# Patient Record
Sex: Female | Born: 1985 | Race: White | Hispanic: No | Marital: Single | Smoking: Never smoker
Health system: Southern US, Community
[De-identification: ages and names within clinical notes are randomized; demographics above are authoritative.]

## PROBLEM LIST (undated history)

## (undated) ENCOUNTER — Inpatient Hospital Stay (HOSPITAL_COMMUNITY): Payer: Self-pay

## (undated) DIAGNOSIS — E669 Obesity, unspecified: Secondary | ICD-10-CM

## (undated) DIAGNOSIS — E282 Polycystic ovarian syndrome: Secondary | ICD-10-CM

## (undated) HISTORY — DX: Obesity, unspecified: E66.9

## (undated) HISTORY — PX: LUNG SURGERY: SHX703

## (undated) HISTORY — PX: MOUTH SURGERY: SHX715

---
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
## 2004-01-13 ENCOUNTER — Ambulatory Visit (HOSPITAL_COMMUNITY): Admission: RE | Admit: 2004-01-13 | Discharge: 2004-01-13 | Payer: Self-pay | Admitting: Obstetrics and Gynecology

## 2004-05-14 ENCOUNTER — Inpatient Hospital Stay (HOSPITAL_COMMUNITY): Admission: AD | Admit: 2004-05-14 | Discharge: 2004-05-14 | Payer: Self-pay | Admitting: Obstetrics and Gynecology

## 2004-05-16 ENCOUNTER — Inpatient Hospital Stay (HOSPITAL_COMMUNITY): Admission: AD | Admit: 2004-05-16 | Discharge: 2004-05-18 | Payer: Self-pay | Admitting: Obstetrics and Gynecology

## 2004-07-15 ENCOUNTER — Other Ambulatory Visit: Admission: RE | Admit: 2004-07-15 | Discharge: 2004-07-15 | Payer: Self-pay | Admitting: Obstetrics and Gynecology

## 2005-09-22 ENCOUNTER — Ambulatory Visit: Payer: Self-pay | Admitting: Family Medicine

## 2005-09-23 ENCOUNTER — Ambulatory Visit: Payer: Self-pay | Admitting: *Deleted

## 2006-08-10 ENCOUNTER — Emergency Department (HOSPITAL_COMMUNITY): Admission: EM | Admit: 2006-08-10 | Discharge: 2006-08-10 | Payer: Self-pay | Admitting: Emergency Medicine

## 2006-08-12 ENCOUNTER — Emergency Department (HOSPITAL_COMMUNITY): Admission: EM | Admit: 2006-08-12 | Discharge: 2006-08-12 | Payer: Self-pay | Admitting: Family Medicine

## 2006-12-14 DIAGNOSIS — A5901 Trichomonal vulvovaginitis: Secondary | ICD-10-CM | POA: Insufficient documentation

## 2007-01-04 ENCOUNTER — Ambulatory Visit: Payer: Self-pay | Admitting: Internal Medicine

## 2007-02-07 ENCOUNTER — Ambulatory Visit: Payer: Self-pay | Admitting: Internal Medicine

## 2007-02-07 LAB — CONVERTED CEMR LAB
Basophils Relative: 1 % (ref 0–1)
Hemoglobin: 14 g/dL (ref 12.0–15.0)
Lymphocytes Relative: 32 % (ref 12–46)
Lymphs Abs: 2.8 10*3/uL (ref 0.7–4.0)
MCHC: 31.7 g/dL (ref 30.0–36.0)
Monocytes Absolute: 0.6 10*3/uL (ref 0.1–1.0)
Monocytes Relative: 7 % (ref 3–12)
Neutro Abs: 4.3 10*3/uL (ref 1.7–7.7)
RBC: 5.02 M/uL (ref 3.87–5.11)

## 2007-02-09 ENCOUNTER — Ambulatory Visit (HOSPITAL_COMMUNITY): Admission: RE | Admit: 2007-02-09 | Discharge: 2007-02-09 | Payer: Self-pay | Admitting: Internal Medicine

## 2007-03-01 ENCOUNTER — Ambulatory Visit: Payer: Self-pay | Admitting: Internal Medicine

## 2007-04-05 ENCOUNTER — Ambulatory Visit: Payer: Self-pay | Admitting: Internal Medicine

## 2007-04-05 ENCOUNTER — Encounter: Payer: Self-pay | Admitting: Internal Medicine

## 2007-04-21 ENCOUNTER — Emergency Department (HOSPITAL_COMMUNITY): Admission: EM | Admit: 2007-04-21 | Discharge: 2007-04-21 | Payer: Self-pay | Admitting: Family Medicine

## 2007-04-25 ENCOUNTER — Ambulatory Visit: Payer: Self-pay | Admitting: Internal Medicine

## 2007-06-15 ENCOUNTER — Ambulatory Visit: Payer: Self-pay | Admitting: Internal Medicine

## 2007-06-15 ENCOUNTER — Encounter: Payer: Self-pay | Admitting: Family Medicine

## 2007-06-15 LAB — CONVERTED CEMR LAB
RBC / HPF: NONE SEEN (ref <3)
WBC, UA: NONE SEEN cells/hpf (ref <3)

## 2007-07-11 ENCOUNTER — Ambulatory Visit: Payer: Self-pay | Admitting: Internal Medicine

## 2007-09-19 ENCOUNTER — Inpatient Hospital Stay (HOSPITAL_COMMUNITY): Admission: AD | Admit: 2007-09-19 | Discharge: 2007-09-19 | Payer: Self-pay | Admitting: Obstetrics

## 2007-09-19 ENCOUNTER — Encounter: Payer: Self-pay | Admitting: Emergency Medicine

## 2007-09-19 ENCOUNTER — Encounter (INDEPENDENT_AMBULATORY_CARE_PROVIDER_SITE_OTHER): Payer: Self-pay | Admitting: Emergency Medicine

## 2007-09-19 ENCOUNTER — Encounter (INDEPENDENT_AMBULATORY_CARE_PROVIDER_SITE_OTHER): Payer: Self-pay | Admitting: Obstetrics

## 2007-12-19 ENCOUNTER — Emergency Department (HOSPITAL_COMMUNITY): Admission: EM | Admit: 2007-12-19 | Discharge: 2007-12-19 | Payer: Self-pay | Admitting: Emergency Medicine

## 2008-03-20 ENCOUNTER — Ambulatory Visit: Payer: Self-pay | Admitting: Internal Medicine

## 2008-07-17 ENCOUNTER — Encounter: Payer: Self-pay | Admitting: Internal Medicine

## 2008-07-17 ENCOUNTER — Ambulatory Visit: Payer: Self-pay | Admitting: Internal Medicine

## 2008-07-17 DIAGNOSIS — H6690 Otitis media, unspecified, unspecified ear: Secondary | ICD-10-CM | POA: Insufficient documentation

## 2008-08-29 ENCOUNTER — Ambulatory Visit: Payer: Self-pay | Admitting: Internal Medicine

## 2008-09-04 ENCOUNTER — Telehealth (INDEPENDENT_AMBULATORY_CARE_PROVIDER_SITE_OTHER): Payer: Self-pay | Admitting: *Deleted

## 2008-09-04 ENCOUNTER — Emergency Department (HOSPITAL_COMMUNITY): Admission: EM | Admit: 2008-09-04 | Discharge: 2008-09-04 | Payer: Self-pay | Admitting: Emergency Medicine

## 2008-09-17 ENCOUNTER — Ambulatory Visit: Payer: Self-pay | Admitting: Internal Medicine

## 2008-10-14 ENCOUNTER — Telehealth (INDEPENDENT_AMBULATORY_CARE_PROVIDER_SITE_OTHER): Payer: Self-pay | Admitting: *Deleted

## 2008-10-15 ENCOUNTER — Encounter: Admission: RE | Admit: 2008-10-15 | Discharge: 2008-10-15 | Payer: Self-pay | Admitting: Internal Medicine

## 2008-10-15 ENCOUNTER — Ambulatory Visit: Payer: Self-pay | Admitting: Internal Medicine

## 2008-11-12 ENCOUNTER — Ambulatory Visit: Payer: Self-pay | Admitting: Internal Medicine

## 2008-12-18 ENCOUNTER — Telehealth (INDEPENDENT_AMBULATORY_CARE_PROVIDER_SITE_OTHER): Payer: Self-pay | Admitting: *Deleted

## 2008-12-19 ENCOUNTER — Ambulatory Visit: Payer: Self-pay | Admitting: Family Medicine

## 2009-07-02 ENCOUNTER — Ambulatory Visit: Payer: Self-pay | Admitting: Internal Medicine

## 2009-07-02 LAB — CONVERTED CEMR LAB
Preg, Serum: NEGATIVE
TSH: 2.102 microintl units/mL (ref 0.350–4.500)

## 2009-10-12 ENCOUNTER — Emergency Department (HOSPITAL_COMMUNITY): Admission: EM | Admit: 2009-10-12 | Discharge: 2009-10-13 | Payer: Self-pay | Admitting: Emergency Medicine

## 2009-10-28 ENCOUNTER — Ambulatory Visit (HOSPITAL_COMMUNITY): Admission: RE | Admit: 2009-10-28 | Discharge: 2009-10-28 | Payer: Self-pay | Admitting: Family Medicine

## 2010-03-01 ENCOUNTER — Emergency Department (HOSPITAL_COMMUNITY)
Admission: EM | Admit: 2010-03-01 | Discharge: 2010-03-01 | Payer: Self-pay | Source: Home / Self Care | Admitting: Family Medicine

## 2010-04-13 ENCOUNTER — Encounter (INDEPENDENT_AMBULATORY_CARE_PROVIDER_SITE_OTHER): Payer: Self-pay | Admitting: Family Medicine

## 2010-04-13 LAB — CONVERTED CEMR LAB
CO2: 27 meq/L (ref 19–32)
Creatinine, Ser: 0.63 mg/dL (ref 0.40–1.20)
Eosinophils Relative: 7 % — ABNORMAL HIGH (ref 0–5)
Glucose, Bld: 91 mg/dL (ref 70–99)
HCT: 43 % (ref 36.0–46.0)
Hemoglobin: 13.4 g/dL (ref 12.0–15.0)
Hgb A1c MFr Bld: 6.2 % — ABNORMAL HIGH (ref <5.7)
Lymphocytes Relative: 31 % (ref 12–46)
Lymphs Abs: 3 10*3/uL (ref 0.7–4.0)
Monocytes Absolute: 0.9 10*3/uL (ref 0.1–1.0)
RBC: 4.84 M/uL (ref 3.87–5.11)
Total Bilirubin: 0.3 mg/dL (ref 0.3–1.2)
Total Protein: 7.2 g/dL (ref 6.0–8.3)

## 2010-04-15 LAB — URINALYSIS, ROUTINE W REFLEX MICROSCOPIC
Bilirubin Urine: NEGATIVE
Glucose, UA: NEGATIVE mg/dL
Ketones, ur: NEGATIVE mg/dL
Nitrite: NEGATIVE
Protein, ur: NEGATIVE mg/dL
Specific Gravity, Urine: 1.028 (ref 1.005–1.030)
Urobilinogen, UA: 1 mg/dL (ref 0.0–1.0)
pH: 6.5 (ref 5.0–8.0)

## 2010-04-15 LAB — BASIC METABOLIC PANEL
Chloride: 108 mEq/L (ref 96–112)
GFR calc Af Amer: >60 mL/min (ref >60)
GFR calc non Af Amer: >60 mL/min (ref >60)
Potassium: 3.4 mEq/L — ABNORMAL LOW (ref 3.5–5.1)

## 2010-04-15 LAB — DIFFERENTIAL
Basophils Absolute: 0.1 10*3/uL (ref 0.0–0.1)
Basophils Relative: 1 % (ref 0–1)
Eosinophils Absolute: 0.6 K/uL (ref 0.0–0.7)
Eosinophils Relative: 5 % (ref 0–5)
Lymphocytes Relative: 22 % (ref 12–46)
Lymphs Abs: 2.7 10*3/uL (ref 0.7–4.0)
Monocytes Absolute: 1.3 10*3/uL — ABNORMAL HIGH (ref 0.1–1.0)
Monocytes Relative: 11 % (ref 3–12)
Neutro Abs: 7.3 10*3/uL (ref 1.7–7.7)
Neutrophils Relative %: 60 % (ref 43–77)

## 2010-04-15 LAB — BASIC METABOLIC PANEL WITH GFR
BUN: 10 mg/dL (ref 6–23)
CO2: 26 meq/L (ref 19–32)
Calcium: 8.7 mg/dL (ref 8.4–10.5)
Creatinine, Ser: 0.77 mg/dL (ref 0.4–1.2)
Glucose, Bld: 144 mg/dL — ABNORMAL HIGH (ref 70–99)
Sodium: 138 meq/L (ref 135–145)

## 2010-04-15 LAB — POCT I-STAT, CHEM 8
BUN: 10 mg/dL (ref 6–23)
Calcium, Ion: 1.17 mmol/L (ref 1.12–1.32)
Chloride: 105 mEq/L (ref 96–112)
Creatinine, Ser: 0.7 mg/dL (ref 0.4–1.2)
Glucose, Bld: 141 mg/dL — ABNORMAL HIGH (ref 70–99)
HCT: 40 % (ref 36.0–46.0)
Hemoglobin: 13.6 g/dL (ref 12.0–15.0)
Potassium: 3.5 meq/L (ref 3.5–5.1)
Sodium: 143 mEq/L (ref 135–145)
TCO2: 26 mmol/L (ref 0–100)

## 2010-04-15 LAB — WET PREP, GENITAL
Clue Cells Wet Prep HPF POC: NONE SEEN
Trich, Wet Prep: NONE SEEN
Yeast Wet Prep HPF POC: NONE SEEN

## 2010-04-15 LAB — CBC
HCT: 39.2 % (ref 36.0–46.0)
Hemoglobin: 12.8 g/dL (ref 12.0–15.0)
MCH: 28.3 pg (ref 26.0–34.0)
MCHC: 32.7 g/dL (ref 30.0–36.0)
MCV: 86.7 fL (ref 78.0–100.0)
Platelets: 315 10*3/uL (ref 150–400)
RBC: 4.52 MIL/uL (ref 3.87–5.11)
RDW: 12.9 % (ref 11.5–15.5)
WBC: 12.1 10*3/uL — ABNORMAL HIGH (ref 4.0–10.5)

## 2010-04-15 LAB — GC/CHLAMYDIA PROBE AMP, GENITAL
Chlamydia, DNA Probe: NEGATIVE
GC Probe Amp, Genital: NEGATIVE

## 2010-04-15 LAB — URINE MICROSCOPIC-ADD ON

## 2010-04-15 LAB — POCT PREGNANCY, URINE: Preg Test, Ur: NEGATIVE

## 2010-04-15 LAB — RPR: RPR Ser Ql: NONREACTIVE

## 2010-06-18 NOTE — Op Note (Signed)
NAMEALEJANDRO, ADCOX        ACCOUNT NO.:  [id number]   MEDICAL RECORD NO.:  [id number]          PATIENT TYPE:  INP   LOCATION:  9128                          FACILITY:  [facility]   PHYSICIAN:  Charles A. Delcambre, MDDATE OF BIRTH:  [birth year]   DATE OF PROCEDURE:  05/16/2004  DATE OF DISCHARGE:                                 OPERATIVE REPORT   PROCEDURE:  Delivery note.   DESCRIPTION OF PROCEDURE:  This patient became complete at 1236.  She  delivered by spontaneous vaginal delivery at 1249.  Vigorous female, Apgars  9 and 9, after pushing well.  Fetal heart rate was reactive and the baby was  vigorous as noted above.  A second degree midline laceration was noted, 3-0  Vicryl with local anesthesia was used to repair.  The placenta was  spontaneous, three-vessel, and intact.  Estimated blood loss was 300 mL.  Father of the baby cut the cord.  The placenta was delivered at 1305.  Mother and baby are recovering and stable at this time.  Penicillin had been  given per protocol.  Epidural had been given when the patient was 7 to 8 cm.  Right after epidural, she was noted to be anterior lip, complete, and +2.  She became completely dilated several minutes thereafter and delivery ensued  as noted.  For remainder of note, see the patient's chart.      CAD/MEDQ  D:  05/16/2004  T:  05/16/2004  Job:  [id number]

## 2010-09-25 ENCOUNTER — Ambulatory Visit (INDEPENDENT_AMBULATORY_CARE_PROVIDER_SITE_OTHER): Payer: Medicaid Other

## 2010-09-25 ENCOUNTER — Inpatient Hospital Stay (INDEPENDENT_AMBULATORY_CARE_PROVIDER_SITE_OTHER)
Admission: RE | Admit: 2010-09-25 | Discharge: 2010-09-25 | Disposition: A | Discharge status: Home / Self Care | Payer: Medicaid Other | Source: Ambulatory Visit | Attending: Emergency Medicine | Admitting: Emergency Medicine

## 2010-09-25 DIAGNOSIS — K219 Gastro-esophageal reflux disease without esophagitis: Secondary | ICD-10-CM

## 2010-11-11 ENCOUNTER — Emergency Department (HOSPITAL_COMMUNITY)
Admission: EM | Admit: 2010-11-11 | Discharge: 2010-11-12 | Disposition: A | Discharge status: Home / Self Care | Payer: Medicaid Other | Attending: Emergency Medicine | Admitting: Emergency Medicine

## 2010-11-11 DIAGNOSIS — R1012 Left upper quadrant pain: Secondary | ICD-10-CM | POA: Insufficient documentation

## 2010-11-11 DIAGNOSIS — K2970 Gastritis, unspecified, without bleeding: Secondary | ICD-10-CM | POA: Insufficient documentation

## 2010-11-11 DIAGNOSIS — J45909 Unspecified asthma, uncomplicated: Secondary | ICD-10-CM | POA: Insufficient documentation

## 2010-11-11 DIAGNOSIS — E282 Polycystic ovarian syndrome: Secondary | ICD-10-CM | POA: Insufficient documentation

## 2010-11-11 LAB — COMPREHENSIVE METABOLIC PANEL
ALT: 192 U/L — ABNORMAL HIGH (ref 0–35)
AST: 97 U/L — ABNORMAL HIGH (ref 0–37)
Alkaline Phosphatase: 85 U/L (ref 39–117)
CO2: 27 mEq/L (ref 19–32)
Chloride: 108 mEq/L (ref 96–112)
Creatinine, Ser: 0.62 mg/dL (ref 0.50–1.10)
GFR calc non Af Amer: >90 mL/min (ref >90)
Potassium: 3.5 mEq/L (ref 3.5–5.1)
Total Bilirubin: 0.2 mg/dL — ABNORMAL LOW (ref 0.3–1.2)

## 2010-11-11 LAB — CBC
MCV: 85 fL (ref 78.0–100.0)
Platelets: 301 10*3/uL (ref 150–400)
RBC: 4.92 MIL/uL (ref 3.87–5.11)
WBC: 9.5 10*3/uL (ref 4.0–10.5)

## 2010-11-11 LAB — DIFFERENTIAL
Eosinophils Absolute: 0.6 10*3/uL (ref 0.0–0.7)
Lymphocytes Relative: 28 % (ref 12–46)
Lymphs Abs: 2.7 10*3/uL (ref 0.7–4.0)
Neutrophils Relative %: 51 % (ref 43–77)

## 2010-11-12 ENCOUNTER — Emergency Department (HOSPITAL_COMMUNITY): Payer: Medicaid Other

## 2010-11-16 LAB — CULTURE, ROUTINE-ABSCESS

## 2011-06-29 ENCOUNTER — Emergency Department (HOSPITAL_COMMUNITY): Payer: Medicaid Other

## 2011-06-29 ENCOUNTER — Emergency Department (HOSPITAL_COMMUNITY)
Admission: EM | Admit: 2011-06-29 | Discharge: 2011-06-29 | Disposition: A | Discharge status: Home Health | Payer: Medicaid Other | Attending: Emergency Medicine | Admitting: Emergency Medicine

## 2011-06-29 ENCOUNTER — Encounter (HOSPITAL_COMMUNITY): Payer: Self-pay | Admitting: Emergency Medicine

## 2011-06-29 DIAGNOSIS — R220 Localized swelling, mass and lump, head: Secondary | ICD-10-CM | POA: Insufficient documentation

## 2011-06-29 DIAGNOSIS — J45909 Unspecified asthma, uncomplicated: Secondary | ICD-10-CM | POA: Insufficient documentation

## 2011-06-29 DIAGNOSIS — R51 Headache: Secondary | ICD-10-CM | POA: Insufficient documentation

## 2011-06-29 DIAGNOSIS — S022XXA Fracture of nasal bones, initial encounter for closed fracture: Secondary | ICD-10-CM | POA: Insufficient documentation

## 2011-06-29 DIAGNOSIS — IMO0002 Reserved for concepts with insufficient information to code with codable children: Secondary | ICD-10-CM | POA: Insufficient documentation

## 2011-06-29 HISTORY — DX: Polycystic ovarian syndrome: E28.2

## 2011-06-29 LAB — POCT PREGNANCY, URINE: Preg Test, Ur: NEGATIVE

## 2011-06-29 MED ORDER — HYDROCODONE-ACETAMINOPHEN 5-325 MG PO TABS
1.0000 | ORAL_TABLET | Freq: Once | ORAL | Status: AC
  Administered 2011-06-29: 1 via ORAL
  Filled 2011-06-29: qty 1

## 2011-06-29 MED ORDER — HYDROCODONE-ACETAMINOPHEN 5-325 MG PO TABS: 1.0000 | ORAL_TABLET | ORAL | Status: AC | PRN

## 2011-06-29 NOTE — Discharge Instructions (Signed)
Nasal Fracture A fracture is a break in the bone. A nasal fracture is a broken nose. Minor breaks do not need treatment. Serious breaks may need surgery.  HOME CARE  Put ice on the injured area.   Put ice in a plastic bag.   Place a towel between your skin and the bag.   Leave the ice on for 15 to 20 minutes, 3 to 4 times a day.   Only take medicine as told by your doctor.   If your nose bleeds, squeeze your nose shut gently. Sit upright for 10 minutes.   Do not play contact sports for 3 to 4 weeks or as told by your doctor.  GET HELP RIGHT AWAY IF:   You have more pain or severe pain.   You keep having nosebleeds.   The shape of your nose does not return to normal after 5 days.   You have yellowish white fluid (pus) coming from your nose.   Your nose bleeds for over 20 minutes.   Clear fluid drains from your nose.   You have a grape-like puffiness (swelling) on the inside of your nose.   You have trouble moving your eyes.   You keep throwing up (vomiting).  MAKE SURE YOU:   Understand these instructions.   Will watch this condition.   Will get help right away if you are not doing well or get worse.  Document Released: 10/27/2007 Document Revised: 01/06/2011 Document Reviewed: 05/03/2010 Exit[employer] Patient Information 2012 [employer]. 
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
## 2011-06-29 NOTE — ED Notes (Signed)
Discharge instructions reviewed with pt; verbalizes understanding.  No questions asked; no further c/o's voiced.  Pt ambulatory to lobby.  NAD noted. 
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
## 2012-02-01 NOTE — L&D Delivery Note (Signed)
Attestation of Attending Supervision of Advanced Practitioner (CNM/NP): Evaluation and management procedures were performed by the Advanced Practitioner under my supervision and collaboration.  I have reviewed the Advanced Practitioner's note and chart, and I agree with the management and plan.  Shadae Reino 09/20/2012 11:58 AM

## 2012-02-01 NOTE — L&D Delivery Note (Signed)
Delivery Note After a brief 2nd stage, t 7:04 AM a viable and healthy female was delivered via Vaginal, Spontaneous Delivery (Presentation: Right Occiput Anterior).  APGAR: 8, 9; weight pending Placenta status: , .  Cord: 3 vessels with the following complications: None.   Anesthesia: Epidural  Episiotomy: None Lacerations: none Suture Repair: n/a Est. Blood Loss (mL): 200  Mom to postpartum.  Baby to nursery-stable.  CRESENZO-DISHMAN,Ambrie Carte 09/17/2012, 7:18 AM   Delivery by Dr. Saintclair Halsted under my supervision

## 2012-02-03 ENCOUNTER — Encounter (HOSPITAL_COMMUNITY): Payer: Self-pay | Admitting: *Deleted

## 2012-02-03 ENCOUNTER — Inpatient Hospital Stay (HOSPITAL_COMMUNITY)
Admission: AD | Admit: 2012-02-03 | Discharge: 2012-02-03 | Disposition: A | Discharge status: Home / Self Care | Payer: Medicaid Other | Source: Ambulatory Visit | Attending: Obstetrics & Gynecology | Admitting: Obstetrics & Gynecology

## 2012-02-03 DIAGNOSIS — Z3490 Encounter for supervision of normal pregnancy, unspecified, unspecified trimester: Secondary | ICD-10-CM

## 2012-02-03 DIAGNOSIS — Z3201 Encounter for pregnancy test, result positive: Secondary | ICD-10-CM | POA: Insufficient documentation

## 2012-02-03 NOTE — MAU Provider Note (Signed)
Attestation of Attending Supervision of Advanced Practitioner (PA/CNM/NP): Evaluation and management procedures were performed by the Advanced Practitioner under my supervision and collaboration.  I have reviewed the Advanced Practitioner's note and chart, and I agree with the management and plan.  Letti Towell, MD, FACOG Attending Obstetrician & Gynecologist Faculty Practice, [facility]  

## 2012-02-03 NOTE — MAU Provider Note (Signed)
  History     CSN: [id number]  Arrival date and time: 02/03/12 1012   First Provider Initiated Contact with Patient 02/03/12 1046      Chief Complaint  Patient presents with  . Possible Pregnancy   HPI Ms. Kaitlyn Walton is a 27 y.o. G4 P2012 at [redacted]w[redacted]d who presents to MAU today for pregnancy verification. The patient has had a +HPT and a +UPT at the [facility] recently. She was told that she would need to come here to confirm her dating because she was unsure of her LMP and has had "false positive" pregnancy tests before which she states is secondary to her PCOS. The patient denies abdominal pain, abnormal discharge, vaginal bleeding, N/V/D or fevers. She gives an LMP of approximately 4 months ago although she has very irregular periods secondary to PCOS.   OB History    Grav Para Term Preterm Abortions TAB SAB Ect Mult Living   4 2 2  1  1   2       Past Medical History  Diagnosis Date  . Asthma   . PCOS (polycystic ovarian syndrome)     Past Surgical History  Procedure Date  . Mouth surgery   . Lung surgery     History reviewed. No pertinent family history.  History  Substance Use Topics  . Smoking status: Never Smoker   . Smokeless tobacco: Not on file  . Alcohol Use: No    Allergies:  Allergies  Allergen Reactions  . Cefaclor     "ceclor"= childhood allergy  . Sulfonamide Derivatives     Sulfa= childhood allergy    No prescriptions prior to admission    ROS All negative unless otherwise noted in HPI Physical Exam   Blood pressure 134/84, pulse 87, temperature 98.1 F (36.7 C), temperature source Oral, resp. rate 20, last menstrual period 10/04/2011.  Physical Exam  Constitutional: She is oriented to person, place, and time. She appears well-developed and well-nourished. No distress.  HENT:  Head: Normocephalic and atraumatic.  Cardiovascular: Normal rate, regular rhythm and normal heart sounds.   Respiratory: Effort normal and breath sounds normal.  No respiratory distress.  GI: Soft. Bowel sounds are normal. She exhibits no distension and no mass. There is no tenderness. There is no rebound and no guarding.  Neurological: She is alert and oriented to person, place, and time.  Skin: Skin is warm and dry. No erythema.  Psychiatric: She has a normal mood and affect.    MAU Course  Procedures Attempted bedside US and doppler. Unable to confirm cardiac activity, but patient dating is very unsure, so most likely patient is just too early  MDM Discussed patient with Dr. Macon Large. She does not feel that the patient needs emergency [place] since the patient has no complaints today. Patient later states that she may have appointment in [facility] clinic. I confirmed this appointment to start prenatal care is scheduled on 02/20/12 @ 9:30 am Outpatient [place] to confirm dating and viability ordered today to be performed next week prior to start of prenatal care.   Assessment and Plan  A: Pregnant  P: Discharge home Pregnancy confirmation letter given Outpatient [place] ordered. Patient will be called from [place] to schedule Patient encouraged to keep appointment for [facility] clinic to start prenatal care and begin taking prenatal vitamins Patient may return to MAU as needed  Freddi Starr, PA-C 02/03/2012, 11:58 AM

## 2012-02-03 NOTE — MAU Note (Signed)
Needs pregnancy verification.  Hx of PCOS, LMP ~4 months ago.  Had pos test at [facility] yesterday, has had false pos before with urine, was told to come here to find out how far along she is.

## 2012-02-09 ENCOUNTER — Ambulatory Visit (HOSPITAL_COMMUNITY)
Admission: RE | Admit: 2012-02-09 | Discharge: 2012-02-09 | Disposition: A | Discharge status: Home / Self Care | Payer: Medicaid Other | Source: Ambulatory Visit | Attending: Medical | Admitting: Medical

## 2012-02-09 ENCOUNTER — Other Ambulatory Visit (HOSPITAL_COMMUNITY): Payer: Self-pay | Admitting: Medical

## 2012-02-09 DIAGNOSIS — O99210 Obesity complicating pregnancy, unspecified trimester: Secondary | ICD-10-CM | POA: Insufficient documentation

## 2012-02-09 DIAGNOSIS — O3680X0 Pregnancy with inconclusive fetal viability, not applicable or unspecified: Secondary | ICD-10-CM | POA: Insufficient documentation

## 2012-02-09 DIAGNOSIS — O09299 Supervision of pregnancy with other poor reproductive or obstetric history, unspecified trimester: Secondary | ICD-10-CM | POA: Insufficient documentation

## 2012-02-09 DIAGNOSIS — O365990 Maternal care for other known or suspected poor fetal growth, unspecified trimester, not applicable or unspecified: Secondary | ICD-10-CM | POA: Insufficient documentation

## 2012-02-09 DIAGNOSIS — E282 Polycystic ovarian syndrome: Secondary | ICD-10-CM

## 2012-02-09 DIAGNOSIS — O9989 Other specified diseases and conditions complicating pregnancy, childbirth and the puerperium: Secondary | ICD-10-CM

## 2012-02-09 DIAGNOSIS — E669 Obesity, unspecified: Secondary | ICD-10-CM | POA: Insufficient documentation

## 2012-02-20 ENCOUNTER — Ambulatory Visit (INDEPENDENT_AMBULATORY_CARE_PROVIDER_SITE_OTHER): Payer: Medicaid Other | Admitting: Obstetrics and Gynecology

## 2012-02-20 ENCOUNTER — Encounter: Payer: Self-pay | Admitting: Obstetrics and Gynecology

## 2012-02-20 VITALS — BP 123/82 | Temp 98.6°F | Ht 64.0 in | Wt 256.2 lb

## 2012-02-20 DIAGNOSIS — Z3490 Encounter for supervision of normal pregnancy, unspecified, unspecified trimester: Secondary | ICD-10-CM | POA: Insufficient documentation

## 2012-02-20 DIAGNOSIS — Z8742 Personal history of other diseases of the female genital tract: Secondary | ICD-10-CM

## 2012-02-20 DIAGNOSIS — O210 Mild hyperemesis gravidarum: Secondary | ICD-10-CM

## 2012-02-20 DIAGNOSIS — O99210 Obesity complicating pregnancy, unspecified trimester: Secondary | ICD-10-CM

## 2012-02-20 DIAGNOSIS — E669 Obesity, unspecified: Secondary | ICD-10-CM

## 2012-02-20 DIAGNOSIS — O26899 Other specified pregnancy related conditions, unspecified trimester: Secondary | ICD-10-CM

## 2012-02-20 LAB — POCT URINALYSIS DIP (DEVICE)
Glucose, UA: NEGATIVE mg/dL
Leukocytes, UA: NEGATIVE
Nitrite: NEGATIVE
Urobilinogen, UA: 0.2 mg/dL (ref 0.0–1.0)
pH: 6.5 (ref 5.0–8.0)

## 2012-02-20 MED ORDER — CONCEPT OB 130-92.4-1 MG PO CAPS: 1.0000 | ORAL_CAPSULE | Freq: Every day | ORAL | Status: DC

## 2012-02-20 MED ORDER — DOXYLAMINE-PYRIDOXINE 10-10 MG PO TBEC: 1.0000 | DELAYED_RELEASE_TABLET | Freq: Two times a day (BID) | ORAL | Status: DC

## 2012-02-20 NOTE — Patient Instructions (Signed)
Pregnancy - First Trimester During sexual intercourse, millions of sperm go into the vagina. Only 1 sperm will penetrate and fertilize the female egg while it is in the Fallopian tube. One week later, the fertilized egg implants into the wall of the uterus. An embryo begins to develop into a baby. At 6 to 8 weeks, the eyes and face are formed and the heartbeat can be seen on ultrasound. At the end of 12 weeks (first trimester), all the baby's organs are formed. Now that you are pregnant, you will want to do everything you can to have a healthy baby. Two of the most important things are to get good prenatal care and follow your caregiver's instructions. Prenatal care is all the medical care you receive before the baby's birth. It is given to prevent, find, and treat problems during the pregnancy and childbirth. PRENATAL EXAMS  During prenatal visits, your weight, blood pressure and urine are checked. This is done to make sure you are healthy and progressing normally during the pregnancy.  A pregnant woman should gain 25 to 35 pounds during the pregnancy. However, if you are over weight or underweight, your caregiver will advise you regarding your weight.  Your caregiver will ask and answer questions for you.  Blood work, cervical cultures, other necessary tests and a Pap test are done during your prenatal exams. These tests are done to check on your health and the probable health of your baby. Tests are strongly recommended and done for HIV with your permission. This is the virus that causes AIDS. These tests are done because medications can be given to help prevent your baby from being born with this infection should you have been infected without knowing it. Blood work is also used to find out your blood type, previous infections and follow your blood levels (hemoglobin).  Low hemoglobin (anemia) is common during pregnancy. Iron and vitamins are given to help prevent this. Later in the pregnancy, blood  tests for diabetes will be done along with any other tests if any problems develop. You may need tests to make sure you and the baby are doing well.  You may need other tests to make sure you and the baby are doing well. CHANGES DURING THE FIRST TRIMESTER (THE FIRST 3 MONTHS OF PREGNANCY) Your body goes through many changes during pregnancy. They vary from person to person. Talk to your caregiver about changes you notice and are concerned about. Changes can include:  Your menstrual period stops.  The egg and sperm carry the genes that determine what you look like. Genes from you and your partner are forming a baby. The female genes determine whether the baby is a boy or a girl.  Your body increases in girth and you may feel bloated.  Feeling sick to your stomach (nauseous) and throwing up (vomiting). If the vomiting is uncontrollable, call your caregiver.  Your breasts will begin to enlarge and become tender.  Your nipples may stick out more and become darker.  The need to urinate more. Painful urination may mean you have a bladder infection.  Tiring easily.  Loss of appetite.  Cravings for certain kinds of food.  At first, you may gain or lose a couple of pounds.  You may have changes in your emotions from day to day (excited to be pregnant or concerned something may go wrong with the pregnancy and baby).  You may have more vivid and strange dreams. HOME CARE INSTRUCTIONS   It is very important   to avoid all smoking, alcohol and un-prescribed drugs during your pregnancy. These affect the formation and growth of the baby. Avoid chemicals while pregnant to ensure the delivery of a healthy infant.  Start your prenatal visits by the 12th week of pregnancy. They are usually scheduled monthly at first, then more often in the last 2 months before delivery. Keep your caregiver's appointments. Follow your caregiver's instructions regarding medication use, blood and lab tests, exercise, and  diet.  During pregnancy, you are providing food for you and your baby. Eat regular, well-balanced meals. Choose foods such as meat, fish, milk and other low fat dairy products, vegetables, fruits, and whole-grain breads and cereals. Your caregiver will tell you of the ideal weight gain.  You can help morning sickness by keeping soda crackers at the bedside. Eat a couple before arising in the morning. You may want to use the crackers without salt on them.  Eating 4 to 5 small meals rather than 3 large meals a day also may help the nausea and vomiting.  Drinking liquids between meals instead of during meals also seems to help nausea and vomiting.  A physical sexual relationship may be continued throughout pregnancy if there are no other problems. Problems may be early (premature) leaking of amniotic fluid from the membranes, vaginal bleeding, or belly (abdominal) pain.  Exercise regularly if there are no restrictions. Check with your caregiver or physical therapist if you are unsure of the safety of some of your exercises. Greater weight gain will occur in the last 2 trimesters of pregnancy. Exercising will help:  Control your weight.  Keep you in shape.  Prepare you for labor and delivery.  Help you lose your pregnancy weight after you deliver your baby.  Wear a good support or jogging bra for breast tenderness during pregnancy. This may help if worn during sleep too.  Ask when prenatal classes are available. Begin classes when they are offered.  Do not use hot tubs, steam rooms or saunas.  Wear your seat belt when driving. This protects you and your baby if you are in an accident.  Avoid raw meat, uncooked cheese, cat litter boxes and soil used by cats throughout the pregnancy. These carry germs that can cause birth defects in the baby.  The first trimester is a good time to visit your dentist for your dental health. Getting your teeth cleaned is OK. Use a softer toothbrush and brush  gently during pregnancy.  Ask for help if you have financial, counseling or nutritional needs during pregnancy. Your caregiver will be able to offer counseling for these needs as well as refer you for other special needs.  Do not take any medications or herbs unless told by your caregiver.  Inform your caregiver if there is any mental or physical domestic violence.  Make a list of emergency phone numbers of family, friends, hospital, and police and fire departments.  Write down your questions. Take them to your prenatal visit.  Do not douche.  Do not cross your legs.  If you have to stand for long periods of time, rotate you feet or take small steps in a circle.  You may have more vaginal secretions that may require a sanitary pad. Do not use tampons or scented sanitary pads. MEDICATIONS AND DRUG USE IN PREGNANCY  Take prenatal vitamins as directed. The vitamin should contain 1 milligram of folic acid. Keep all vitamins out of reach of children. Only a couple vitamins or tablets containing iron may be   fatal to a baby or young child when ingested.  Avoid use of all medications, including herbs, over-the-counter medications, not prescribed or suggested by your caregiver. Only take over-the-counter or prescription medicines for pain, discomfort, or fever as directed by your caregiver. Do not use aspirin, ibuprofen, or naproxen unless directed by your caregiver.  Let your caregiver also know about herbs you may be using.  Alcohol is related to a number of birth defects. This includes fetal alcohol syndrome. All alcohol, in any form, should be avoided completely. Smoking will cause low birth rate and premature babies.  Street or illegal drugs are very harmful to the baby. They are absolutely forbidden. A baby born to an addicted mother will be addicted at birth. The baby will go through the same withdrawal an adult does.  Let your caregiver know about any medications that you have to take  and for what reason you take them. MISCARRIAGE IS COMMON DURING PREGNANCY A miscarriage does not mean you did something wrong. It is not a reason to worry about getting pregnant again. Your caregiver will help you with questions you may have. If you have a miscarriage, you may need minor surgery. SEEK MEDICAL CARE IF:  You have any concerns or worries during your pregnancy. It is better to call with your questions if you feel they cannot wait, rather than worry about them. SEEK IMMEDIATE MEDICAL CARE IF:   An unexplained oral temperature above 102 F (38.9 C) develops, or as your caregiver suggests.  You have leaking of fluid from the vagina (birth canal). If leaking membranes are suspected, take your temperature and inform your caregiver of this when you call.  There is vaginal spotting or bleeding. Notify your caregiver of the amount and how many pads are used.  You develop a bad smelling vaginal discharge with a change in the color.  You continue to feel sick to your stomach (nauseated) and have no relief from remedies suggested. You vomit blood or coffee ground-like materials.  You lose more than 2 pounds of weight in 1 week.  You gain more than 2 pounds of weight in 1 week and you notice swelling of your face, hands, feet, or legs.  You gain 5 pounds or more in 1 week (even if you do not have swelling of your hands, face, legs, or feet).  You get exposed to German measles and have never had them.  You are exposed to fifth disease or chickenpox.  You develop belly (abdominal) pain. Round ligament discomfort is a common non-cancerous (benign) cause of abdominal pain in pregnancy. Your caregiver still must evaluate this.  You develop headache, fever, diarrhea, pain with urination, or shortness of breath.  You fall or are in a car accident or have any kind of trauma.  There is mental or physical violence in your home. Document Released: 01/11/2001 Document Revised: 04/11/2011  Document Reviewed: 07/15/2008 [employer] Patient Information 2013 [employer].  

## 2012-02-20 NOTE — Progress Notes (Signed)
Pulse- 85 Patient reports abdominal pressure & slight discomfort Needs 1 hr gtt @ 1134, OB panel, HIV, GC/ch

## 2012-02-21 LAB — OBSTETRIC PANEL
Basophils Relative: 0 % (ref 0–1)
Eosinophils Absolute: 0.4 10*3/uL (ref 0.0–0.7)
Hepatitis B Surface Ag: NEGATIVE
MCH: 28.1 pg (ref 26.0–34.0)
MCHC: 33.6 g/dL (ref 30.0–36.0)
Neutrophils Relative %: 64 % (ref 43–77)
Platelets: 317 10*3/uL (ref 150–400)
RBC: 4.49 MIL/uL (ref 3.87–5.11)

## 2012-02-21 LAB — GLUCOSE TOLERANCE, 1 HOUR (50G) W/O FASTING: Glucose, 1 Hour GTT: 141 mg/dL — ABNORMAL HIGH (ref 70–140)

## 2012-02-22 ENCOUNTER — Telehealth: Payer: Self-pay | Admitting: General Practice

## 2012-02-22 DIAGNOSIS — O219 Vomiting of pregnancy, unspecified: Secondary | ICD-10-CM

## 2012-02-22 LAB — CULTURE, OB URINE: Colony Count: >=100000

## 2012-02-22 MED ORDER — PROMETHAZINE HCL 25 MG PO TABS: 12.5000 mg | ORAL_TABLET | Freq: Four times a day (QID) | ORAL | Status: DC | PRN

## 2012-02-22 NOTE — Progress Notes (Signed)
E coli ASB > Rx Keflex

## 2012-02-22 NOTE — Telephone Encounter (Signed)
Called patient and informed her of UTI, but upon reviewing allergies couldn't prescribe Keflex as ordered. Spoke with Caren Griffins and she would like to wait to see what sensitives come back before prescribing a Rx. Told patient we would wait to see what antibiotic would be best to treat her UTI with then call her back and let her know when the medication is at the pharmacy and what the medication is. Patient verbalized understanding. Patient asked about nausea medication prescribed on Monday and said there was an issue at the pharmacy with her insurance. Deirdre Christy Gentles said that Phenergan 12.5mg  po q6 hr prn could be ordered instead. Told patient I sent in phenergan to pharmacy instead. Patient verbalized understanding and had no further questions. Will await sensitive results and Rx order from Deirdre Poe

## 2012-02-22 NOTE — Telephone Encounter (Signed)
Message copied by Kathee Delton on Wed Feb 22, 2012  8:51 AM ------      Message from: POE, DEIRDRE C      Created: Wed Feb 22, 2012  8:30 AM       Rx Keflex 500 qid x 7

## 2012-02-23 ENCOUNTER — Encounter (HOSPITAL_COMMUNITY): Payer: Self-pay

## 2012-02-23 ENCOUNTER — Inpatient Hospital Stay (HOSPITAL_COMMUNITY): Payer: Medicaid Other

## 2012-02-23 ENCOUNTER — Inpatient Hospital Stay (HOSPITAL_COMMUNITY)
Admission: AD | Admit: 2012-02-23 | Discharge: 2012-02-23 | Disposition: A | Discharge status: Home / Self Care | Payer: Medicaid Other | Source: Ambulatory Visit | Attending: Obstetrics & Gynecology | Admitting: Obstetrics & Gynecology

## 2012-02-23 DIAGNOSIS — O26859 Spotting complicating pregnancy, unspecified trimester: Secondary | ICD-10-CM | POA: Insufficient documentation

## 2012-02-23 DIAGNOSIS — O209 Hemorrhage in early pregnancy, unspecified: Secondary | ICD-10-CM

## 2012-02-23 LAB — WET PREP, GENITAL

## 2012-02-23 MED ORDER — TERCONAZOLE 0.4 % VA CREA: 1.0000 | TOPICAL_CREAM | Freq: Every day | VAGINAL | Status: DC

## 2012-02-23 MED ORDER — NITROFURANTOIN MONOHYD MACRO 100 MG PO CAPS: 100.0000 mg | ORAL_CAPSULE | Freq: Two times a day (BID) | ORAL | Status: DC

## 2012-02-23 NOTE — MAU Provider Note (Signed)
Chief Complaint: Vaginal Bleeding   First Provider Initiated Contact with Patient 02/23/12 1656     SUBJECTIVE HPI: Kaitlyn Walton is a 27 y.o. [id number] at [redacted]w[redacted]d by LMP who presents with Onset today of light brown spotting, not enough to wear a pad. No previous episodes. Seen for new OB 3 days ago and had GC chlamydia which are pending. She also had ASP which was Escherichia coli pansensitive. Denies vaginal irritation or itch. Abnormal one-hour Glucola so is scheduled for a three-hour OGTT.  Past Medical History  Diagnosis Date  . Asthma   . PCOS (polycystic ovarian syndrome)    OB History    Grav Para Term Preterm Abortions TAB SAB Ect Mult Living   4 2 2  0 1 0 1 0 0 2     # Outc Date GA Lbr Len/2nd Wgt Sex Del Anes PTL Lv   1 TRM 4/04 [redacted]w[redacted]d  7lb2oz(3.232kg) F SVD EPI  Yes   Comments: polydydramnios   2 TRM 4/06 [redacted]w[redacted]d  6lb8oz(2.948kg) F SVD EPI  Yes   3 SAB 2010           4 CUR              Past Surgical History  Procedure Date  . Mouth surgery   . Lung surgery    History   Social History  . Marital Status: Single    Spouse Name: N/A    Number of Children: N/A  . Years of Education: N/A   Occupational History  . Not on file.   Social History Main Topics  . Smoking status: Never Smoker   . Smokeless tobacco: Never Used  . Alcohol Use: No  . Drug Use: No  . Sexually Active: Yes   Other Topics Concern  . Not on file   Social History Narrative  . No narrative on file   No current facility-administered medications on file prior to encounter.   Current Outpatient Prescriptions on File Prior to Encounter  Medication Sig Dispense Refill  . albuterol (PROVENTIL HFA;VENTOLIN HFA) 108 (90 BASE) MCG/ACT inhaler Inhale 2 puffs into the lungs every 6 (six) hours as needed. For wheezing      . Prenat w/o A Vit-FeFum-FePo-FA (CONCEPT OB) 130-92.4-1 MG CAPS Take 1 capsule by mouth daily.  30 capsule  11  . promethazine (PHENERGAN) 25 MG tablet Take 0.5 tablets (12.5  mg total) by mouth every 6 (six) hours as needed for nausea.  30 tablet  1   Allergies  Allergen Reactions  . Cefaclor Other (See Comments)    "ceclor"= childhood allergy  . Sulfonamide Derivatives Other (See Comments)    Sulfa= childhood allergy    ROS: Pertinent items in HPI  OBJECTIVE Blood pressure 119/64, pulse 74, temperature 99.5 F (37.5 C), temperature source Oral, resp. rate 18, height 5\' 3"  (1.6 m), weight 254 lb 6.4 oz (115.395 kg), last menstrual period 10/04/2011, SpO2 100.00%. GENERAL: Well-developed, well-nourished female in no acute distress.  HEENT: Normocephalic HEART: normal rate RESP: normal effort ABDOMEN: Soft, non-tender EXTREMITIES: Nontender, no edema NEURO: Alert and oriented SPECULUM EXAM: NEFG, scant brown-tinged d/c, cervix clean SVE: cervix L/C  LAB RESULTS No results found for this or any previous visit (from the past 24 hour(s)).  IMAGING [place]: all WNL with FHR 175 and no SCH  MAU COURSE  ASSESSMENT 1. Bleeding in early pregnancy   P2 at [redacted]w[redacted]d Asymptomatic bacteruria Abnormal glucose tolerance test Yeast vaginitis  PLAN Discharge home  Follow-up Information  Follow up with [facility]. (Keep your scheduled appointment)           Medication List     As of 02/23/2012  5:40 PM    TAKE these medications         albuterol 108 (90 BASE) MCG/ACT inhaler   Commonly known as: PROVENTIL HFA;VENTOLIN HFA   Inhale 2 puffs into the lungs every 6 (six) hours as needed. For wheezing      CONCEPT OB 130-92.4-1 MG Caps   Take 1 capsule by mouth daily.      nitrofurantoin (macrocrystal-monohydrate) 100 MG capsule   Commonly known as: MACROBID   Take 1 capsule (100 mg total) by mouth 2 (two) times daily.      promethazine 25 MG tablet   Commonly known as: PHENERGAN   Take 0.5 tablets (12.5 mg total) by mouth every 6 (six) hours as needed for nausea.          Danae Orleans, CNM 02/23/2012  5:20 PM

## 2012-02-23 NOTE — MAU Note (Signed)
Pt states noted blood this am, when wiping in MAU was not noted. Intermittent sharp pains, last intercourse 2-3 days ago.

## 2012-02-23 NOTE — MAU Note (Signed)
Patient states she has had some light spotting today. Slight discomfort, no pain.

## 2012-02-24 ENCOUNTER — Other Ambulatory Visit: Payer: Medicaid Other

## 2012-02-24 DIAGNOSIS — R7309 Other abnormal glucose: Secondary | ICD-10-CM

## 2012-02-25 LAB — GLUCOSE TOLERANCE, 3 HOURS: Glucose Tolerance, Fasting: 81 mg/dL (ref 70–104)

## 2012-03-22 ENCOUNTER — Other Ambulatory Visit: Payer: Self-pay | Admitting: Family

## 2012-03-22 ENCOUNTER — Ambulatory Visit (INDEPENDENT_AMBULATORY_CARE_PROVIDER_SITE_OTHER): Payer: Medicaid Other | Admitting: Family

## 2012-03-22 ENCOUNTER — Telehealth: Payer: Self-pay | Admitting: *Deleted

## 2012-03-22 VITALS — BP 119/76 | Temp 98.1°F | Wt 253.4 lb

## 2012-03-22 DIAGNOSIS — O26899 Other specified pregnancy related conditions, unspecified trimester: Secondary | ICD-10-CM

## 2012-03-22 LAB — POCT URINALYSIS DIP (DEVICE)
Glucose, UA: NEGATIVE mg/dL
Hgb urine dipstick: NEGATIVE
Ketones, ur: NEGATIVE mg/dL
Specific Gravity, Urine: 1.015 (ref 1.005–1.030)
Urobilinogen, UA: 0.2 mg/dL (ref 0.0–1.0)

## 2012-03-22 NOTE — Progress Notes (Signed)
No questions or concerns; +FHR with ultrasound; schedule integrated screen.

## 2012-03-22 NOTE — Progress Notes (Signed)
Patient advised unable to schedule an Integrated Screen at this time as the cut off is in 2 days and there is no availability of appointments.

## 2012-03-22 NOTE — Telephone Encounter (Signed)
Pt left message stating that she is not able to have the First Screen test due to availability of appts. She would like to be added to a cancellation list in case an appt becomes available. I called Cheryl @ [facility] and she stated that she has made a note that pt wants the test if possible. I then called pt and informed her that she will be contacted if it becomes possible for the First Screen appt. Also, I informed pt that she can have the Quad screen test as early as 15w 1d in case the First Screen cannot be performed.  Pt may call back to schedule appt for Quad screen test to be done prior to her next clinic appt (3/13) or she can have it done on the [place] of her appt. Pt voiced understanding.

## 2012-03-22 NOTE — Progress Notes (Signed)
Pulse-80   Pain-right stabbing pain had before pregnancy

## 2012-03-22 NOTE — Progress Notes (Signed)
Nutrition note: 1st visit consult Pt has h/o obesity & wt loss during pregnancy. Pt has lost 6.6# @ [redacted]w[redacted]d. Pt reports eating 5-6 small meals/d. Pt reports she was on a strict diet prior to pregnancy (pt stated she lost ~40# before getting pregnant) & has continued eating healthier so is not concerned about continued wt loss.  Pt reports some nausea but no vomiting or heartburn. NKFA. Pt is taking PNV. Intake appears low in grains & dairy products. Pt received verbal & written education on general nutrition during pregnancy. Encouraged 3-4 servings of dairy/d & 8 servings of grains/d.  Disc wt gain goals of 11-20# or 0.5#/wk. Pt agrees to continue taking PNV & try to consume more dairy products. Pt has WIC & plans to BF. F/u in 2-4 wks Kaitlyn Reveal, MS, RD, LDN

## 2012-03-26 ENCOUNTER — Encounter (HOSPITAL_COMMUNITY): Payer: Self-pay

## 2012-03-26 ENCOUNTER — Other Ambulatory Visit: Payer: Self-pay

## 2012-03-26 ENCOUNTER — Ambulatory Visit (HOSPITAL_COMMUNITY)
Admission: RE | Admit: 2012-03-26 | Discharge: 2012-03-26 | Disposition: A | Discharge status: Home / Self Care | Payer: Medicaid Other | Source: Ambulatory Visit | Attending: Family | Admitting: Family

## 2012-03-26 ENCOUNTER — Other Ambulatory Visit: Payer: Self-pay | Admitting: Family

## 2012-03-26 DIAGNOSIS — O351XX0 Maternal care for (suspected) chromosomal abnormality in fetus, not applicable or unspecified: Secondary | ICD-10-CM | POA: Insufficient documentation

## 2012-03-26 DIAGNOSIS — Z3689 Encounter for other specified antenatal screening: Secondary | ICD-10-CM | POA: Insufficient documentation

## 2012-03-26 DIAGNOSIS — O3510X0 Maternal care for (suspected) chromosomal abnormality in fetus, unspecified, not applicable or unspecified: Secondary | ICD-10-CM | POA: Insufficient documentation

## 2012-03-26 DIAGNOSIS — E669 Obesity, unspecified: Secondary | ICD-10-CM | POA: Insufficient documentation

## 2012-03-27 ENCOUNTER — Encounter: Payer: Self-pay | Admitting: Family

## 2012-04-12 ENCOUNTER — Ambulatory Visit (INDEPENDENT_AMBULATORY_CARE_PROVIDER_SITE_OTHER): Payer: Medicaid Other | Admitting: Advanced Practice Midwife

## 2012-04-12 VITALS — BP 125/82 | Temp 97.1°F | Wt 254.9 lb

## 2012-04-12 DIAGNOSIS — Z3492 Encounter for supervision of normal pregnancy, unspecified, second trimester: Secondary | ICD-10-CM

## 2012-04-12 DIAGNOSIS — O26899 Other specified pregnancy related conditions, unspecified trimester: Secondary | ICD-10-CM

## 2012-04-12 LAB — POCT URINALYSIS DIP (DEVICE)
Bilirubin Urine: NEGATIVE
Hgb urine dipstick: NEGATIVE
Nitrite: NEGATIVE
Specific Gravity, Urine: >=1.03 (ref 1.005–1.030)
pH: 6 (ref 5.0–8.0)

## 2012-04-12 NOTE — Progress Notes (Signed)
Patient c/o pressure in her upper abdomen and diarrhea.

## 2012-04-12 NOTE — Patient Instructions (Signed)
Pregnancy - Second Trimester The second trimester of pregnancy (3 to 6 months) is a period of rapid growth for you and your baby. At the end of the sixth month, your baby is about 9 inches long and weighs 1 1/2 pounds. You will begin to feel the baby move between 18 and 20 weeks of the pregnancy. This is called quickening. Weight gain is faster. A clear fluid (colostrum) may leak out of your breasts. You may feel small contractions of the womb (uterus). This is known as false labor or Braxton-Hicks contractions. This is like a practice for labor when the baby is ready to be born. Usually, the problems with morning sickness have usually passed by the end of your first trimester. Some women develop small dark blotches (called cholasma, mask of pregnancy) on their face that usually goes away after the baby is born. Exposure to the sun makes the blotches worse. Acne may also develop in some pregnant women and pregnant women who have acne, may find that it goes away. PRENATAL EXAMS  Blood work may continue to be done during prenatal exams. These tests are done to check on your health and the probable health of your baby. Blood work is used to follow your blood levels (hemoglobin). Anemia (low hemoglobin) is common during pregnancy. Iron and vitamins are given to help prevent this. You will also be checked for diabetes between 24 and 28 weeks of the pregnancy. Some of the previous blood tests may be repeated.  The size of the uterus is measured during each visit. This is to make sure that the baby is continuing to grow properly according to the dates of the pregnancy.  Your blood pressure is checked every prenatal visit. This is to make sure you are not getting toxemia.  Your urine is checked to make sure you do not have an infection, diabetes or protein in the urine.  Your weight is checked often to make sure gains are happening at the suggested rate. This is to ensure that both you and your baby are growing  normally.  Sometimes, an ultrasound is performed to confirm the proper growth and development of the baby. This is a test which bounces harmless sound waves off the baby so your caregiver can more accurately determine due dates. Sometimes, a specialized test is done on the amniotic fluid surrounding the baby. This test is called an amniocentesis. The amniotic fluid is obtained by sticking a needle into the belly (abdomen). This is done to check the chromosomes in instances where there is a concern about possible genetic problems with the baby. It is also sometimes done near the end of pregnancy if an early delivery is required. In this case, it is done to help make sure the baby's lungs are mature enough for the baby to live outside of the womb. CHANGES OCCURING IN THE SECOND TRIMESTER OF PREGNANCY Your body goes through many changes during pregnancy. They vary from person to person. Talk to your caregiver about changes you notice that you are concerned about.  During the second trimester, you will likely have an increase in your appetite. It is normal to have cravings for certain foods. This varies from person to person and pregnancy to pregnancy.  Your lower abdomen will begin to bulge.  You may have to urinate more often because the uterus and baby are pressing on your bladder. It is also common to get more bladder infections during pregnancy (pain with urination). You can help this by   drinking lots of fluids and emptying your bladder before and after intercourse.  You may begin to get stretch marks on your hips, abdomen, and breasts. These are normal changes in the body during pregnancy. There are no exercises or medications to take that prevent this change.  You may begin to develop swollen and bulging veins (varicose veins) in your legs. Wearing support hose, elevating your feet for 15 minutes, 3 to 4 times a day and limiting salt in your diet helps lessen the problem.  Heartburn may develop  as the uterus grows and pushes up against the stomach. Antacids recommended by your caregiver helps with this problem. Also, eating smaller meals 4 to 5 times a day helps.  Constipation can be treated with a stool softener or adding bulk to your diet. Drinking lots of fluids, vegetables, fruits, and whole grains are helpful.  Exercising is also helpful. If you have been very active up until your pregnancy, most of these activities can be continued during your pregnancy. If you have been less active, it is helpful to start an exercise program such as walking.  Hemorrhoids (varicose veins in the rectum) may develop at the end of the second trimester. Warm sitz baths and hemorrhoid cream recommended by your caregiver helps hemorrhoid problems.  Backaches may develop during this time of your pregnancy. Avoid heavy lifting, wear low heal shoes and practice good posture to help with backache problems.  Some pregnant women develop tingling and numbness of their hand and fingers because of swelling and tightening of ligaments in the wrist (carpel tunnel syndrome). This goes away after the baby is born.  As your breasts enlarge, you may have to get a bigger bra. Get a comfortable, cotton, support bra. Do not get a nursing bra until the last month of the pregnancy if you will be nursing the baby.  You may get a dark line from your belly button to the pubic area called the linea nigra.  You may develop rosy cheeks because of increase blood flow to the face.  You may develop spider looking lines of the face, neck, arms and chest. These go away after the baby is born. HOME CARE INSTRUCTIONS   It is extremely important to avoid all smoking, herbs, alcohol, and unprescribed drugs during your pregnancy. These chemicals affect the formation and growth of the baby. Avoid these chemicals throughout the pregnancy to ensure the delivery of a healthy infant.  Most of your home care instructions are the same as  suggested for the first trimester of your pregnancy. Keep your caregiver's appointments. Follow your caregiver's instructions regarding medication use, exercise and diet.  During pregnancy, you are providing food for you and your baby. Continue to eat regular, well-balanced meals. Choose foods such as meat, fish, milk and other low fat dairy products, vegetables, fruits, and whole-grain breads and cereals. Your caregiver will tell you of the ideal weight gain.  A physical sexual relationship may be continued up until near the end of pregnancy if there are no other problems. Problems could include early (premature) leaking of amniotic fluid from the membranes, vaginal bleeding, abdominal pain, or other medical or pregnancy problems.  Exercise regularly if there are no restrictions. Check with your caregiver if you are unsure of the safety of some of your exercises. The greatest weight gain will occur in the last 2 trimesters of pregnancy. Exercise will help you:  Control your weight.  Get you in shape for labor and delivery.  Lose weight   after you have the baby.  Wear a good support or jogging bra for breast tenderness during pregnancy. This may help if worn during sleep. Pads or tissues may be used in the bra if you are leaking colostrum.  Do not use hot tubs, steam rooms or saunas throughout the pregnancy.  Wear your seat belt at all times when driving. This protects you and your baby if you are in an accident.  Avoid raw meat, uncooked cheese, cat litter boxes and soil used by cats. These carry germs that can cause birth defects in the baby.  The second trimester is also a good time to visit your dentist for your dental health if this has not been done yet. Getting your teeth cleaned is OK. Use a soft toothbrush. Brush gently during pregnancy.  It is easier to loose urine during pregnancy. Tightening up and strengthening the pelvic muscles will help with this problem. Practice stopping your  urination while you are going to the bathroom. These are the same muscles you need to strengthen. It is also the muscles you would use as if you were trying to stop from passing gas. You can practice tightening these muscles up 10 times a set and repeating this about 3 times per day. Once you know what muscles to tighten up, do not perform these exercises during urination. It is more likely to contribute to an infection by backing up the urine.  Ask for help if you have financial, counseling or nutritional needs during pregnancy. Your caregiver will be able to offer counseling for these needs as well as refer you for other special needs.  Your skin may become oily. If so, wash your face with mild soap, use non-greasy moisturizer and oil or cream based makeup. MEDICATIONS AND DRUG USE IN PREGNANCY  Take prenatal vitamins as directed. The vitamin should contain 1 milligram of folic acid. Keep all vitamins out of reach of children. Only a couple vitamins or tablets containing iron may be fatal to a baby or young child when ingested.  Avoid use of all medications, including herbs, over-the-counter medications, not prescribed or suggested by your caregiver. Only take over-the-counter or prescription medicines for pain, discomfort, or fever as directed by your caregiver. Do not use aspirin.  Let your caregiver also know about herbs you may be using.  Alcohol is related to a number of birth defects. This includes fetal alcohol syndrome. All alcohol, in any form, should be avoided completely. Smoking will cause low birth rate and premature babies.  Street or illegal drugs are very harmful to the baby. They are absolutely forbidden. A baby born to an addicted mother will be addicted at birth. The baby will go through the same withdrawal an adult does. SEEK MEDICAL CARE IF:  You have any concerns or worries during your pregnancy. It is better to call with your questions if you feel they cannot wait, rather  than worry about them. SEEK IMMEDIATE MEDICAL CARE IF:   An unexplained oral temperature above 102 F (38.9 C) develops, or as your caregiver suggests.  You have leaking of fluid from the vagina (birth canal). If leaking membranes are suspected, take your temperature and tell your caregiver of this when you call.  There is vaginal spotting, bleeding, or passing clots. Tell your caregiver of the amount and how many pads are used. Light spotting in pregnancy is common, especially following intercourse.  You develop a bad smelling vaginal discharge with a change in the color from clear   to white.  You continue to feel sick to your stomach (nauseated) and have no relief from remedies suggested. You vomit blood or coffee ground-like materials.  You lose more than 2 pounds of weight or gain more than 2 pounds of weight over 1 week, or as suggested by your caregiver.  You notice swelling of your face, hands, feet, or legs.  You get exposed to German measles and have never had them.  You are exposed to fifth disease or chickenpox.  You develop belly (abdominal) pain. Round ligament discomfort is a common non-cancerous (benign) cause of abdominal pain in pregnancy. Your caregiver still must evaluate you.  You develop a bad headache that does not go away.  You develop fever, diarrhea, pain with urination, or shortness of breath.  You develop visual problems, blurry, or double vision.  You fall or are in a car accident or any kind of trauma.  There is mental or physical violence at home. Document Released: 01/11/2001 Document Revised: 04/11/2011 Document Reviewed: 07/16/2008 [employer] Patient Information 2013 [employer].  

## 2012-04-12 NOTE — Progress Notes (Signed)
C/O some nausea, no vomiting, a few episodes of diarrhea today. No fever. BRAT diet, immodium PRN, has nausea meds at home. Come to MAU if symptoms become severe. U/S scheduled. AFP today.

## 2012-04-16 ENCOUNTER — Inpatient Hospital Stay (HOSPITAL_COMMUNITY)
Admission: AD | Admit: 2012-04-16 | Discharge: 2012-04-16 | Disposition: A | Discharge status: Home / Self Care | Payer: Medicaid Other | Source: Ambulatory Visit | Attending: Obstetrics & Gynecology | Admitting: Obstetrics & Gynecology

## 2012-04-16 ENCOUNTER — Encounter (HOSPITAL_COMMUNITY): Payer: Self-pay | Admitting: *Deleted

## 2012-04-16 DIAGNOSIS — O368190 Decreased fetal movements, unspecified trimester, not applicable or unspecified: Secondary | ICD-10-CM | POA: Insufficient documentation

## 2012-04-16 DIAGNOSIS — A499 Bacterial infection, unspecified: Secondary | ICD-10-CM | POA: Insufficient documentation

## 2012-04-16 DIAGNOSIS — B9689 Other specified bacterial agents as the cause of diseases classified elsewhere: Secondary | ICD-10-CM | POA: Insufficient documentation

## 2012-04-16 DIAGNOSIS — N760 Acute vaginitis: Secondary | ICD-10-CM | POA: Insufficient documentation

## 2012-04-16 DIAGNOSIS — O2390 Unspecified genitourinary tract infection in pregnancy, unspecified trimester: Secondary | ICD-10-CM | POA: Insufficient documentation

## 2012-04-16 LAB — WET PREP, GENITAL
Trich, Wet Prep: NONE SEEN
Yeast Wet Prep HPF POC: NONE SEEN

## 2012-04-16 LAB — URINALYSIS, ROUTINE W REFLEX MICROSCOPIC
Bilirubin Urine: NEGATIVE
Glucose, UA: NEGATIVE mg/dL
Hgb urine dipstick: NEGATIVE
Specific Gravity, Urine: 1.015 (ref 1.005–1.030)
pH: 7.5 (ref 5.0–8.0)

## 2012-04-16 MED ORDER — METRONIDAZOLE 500 MG PO TABS: 500.0000 mg | ORAL_TABLET | Freq: Two times a day (BID) | ORAL | Status: DC

## 2012-04-16 NOTE — MAU Note (Signed)
No FM in 2 days @ 16/6 wks;, liquid leading today @ 0600.

## 2012-04-16 NOTE — MAU Provider Note (Addendum)
History     CSN: [id number]  Arrival date and time: 04/16/12 1111   First Provider Initiated Contact with Patient 04/16/12 1234      No chief complaint on file.  HPI  Pt is [id number] at [redacted]w[redacted]d pregnancy and presents with not feeling any FM after feeling FM for 3 weeks.  Also, pt c/o of watery discharge since this morning.  Pt denies previous preterm labor or delivery.  Past Medical History  Diagnosis Date  . Asthma   . PCOS (polycystic ovarian syndrome)     Past Surgical History  Procedure Laterality Date  . Mouth surgery    . Lung surgery      Family History  Problem Relation Age of Onset  . Diabetes Mother   . Cancer Maternal Aunt   . Hypertension Maternal Grandmother   . Hypertension Maternal Grandfather   . Cancer Maternal Grandfather   . Hypertension Paternal Grandmother   . Hypertension Paternal Grandfather     History  Substance Use Topics  . Smoking status: Never Smoker   . Smokeless tobacco: Never Used  . Alcohol Use: No    Allergies:  Allergies  Allergen Reactions  . Cefaclor Other (See Comments)    "ceclor"= childhood allergy  . Sulfonamide Derivatives Other (See Comments)    Sulfa= childhood allergy    Prescriptions prior to admission  Medication Sig Dispense Refill  . albuterol (PROVENTIL HFA;VENTOLIN HFA) 108 (90 BASE) MCG/ACT inhaler Inhale 2 puffs into the lungs every 6 (six) hours as needed. For wheezing      . Prenat w/o A Vit-FeFum-FePo-FA (CONCEPT OB) 130-92.4-1 MG CAPS Take 1 capsule by mouth daily.  30 capsule  11  . Prenatal Vit-Fe Fumarate-FA (PRENATAL MULTIVITAMIN) TABS Take 1 tablet by mouth daily at 12 noon.      . promethazine (PHENERGAN) 25 MG tablet Take 0.5 tablets (12.5 mg total) by mouth every 6 (six) hours as needed for nausea.  30 tablet  1    Review of Systems  Constitutional: Negative for fever and chills.  Gastrointestinal: Negative for nausea, vomiting, abdominal pain, diarrhea and constipation.  Genitourinary:  Negative for dysuria and urgency.   Physical Exam   Blood pressure 112/68, pulse 86, temperature 98 F (36.7 C), temperature source Oral, resp. rate 16, height 5\' 4"  (1.626 m), weight 157 lb 8 oz (71.442 kg), last menstrual period 10/04/2011.  Physical Exam  Nursing note and vitals reviewed. Constitutional: She is oriented to person, place, and time. She appears well-developed and well-nourished.  HENT:  Head: Normocephalic.  Eyes: Pupils are equal, round, and reactive to light.  Neck: Normal range of motion. Neck supple.  Cardiovascular: Normal rate.   Respiratory: Effort normal.  GI: Soft. She exhibits no distension. There is no tenderness. There is no rebound and no guarding.  Genitourinary:  Mod amount of frothy yellow discharge in vault; cervix closed, NT; uterus gravid- FHT obtained with dopper  Musculoskeletal: Normal range of motion.  Neurological: She is alert and oriented to person, place, and time.  Skin: Skin is warm and dry.    MAU Course  Procedures Results for orders placed during the hospital encounter of 04/16/12 (from the past 24 hour(s))  URINALYSIS, ROUTINE W REFLEX MICROSCOPIC     Status: None   Collection Time    04/16/12 12:05 PM      Result Value Range   Color, Urine YELLOW  YELLOW   APPearance CLEAR  CLEAR   Specific Gravity, Urine 1.015  1.005 -  1.030   pH 7.5  5.0 - 8.0   Glucose, UA NEGATIVE  NEGATIVE mg/dL   Hgb urine dipstick NEGATIVE  NEGATIVE   Bilirubin Urine NEGATIVE  NEGATIVE   Ketones, ur NEGATIVE  NEGATIVE mg/dL   Protein, ur NEGATIVE  NEGATIVE mg/dL   Urobilinogen, UA 0.2  0.0 - 1.0 mg/dL   Nitrite NEGATIVE  NEGATIVE   Leukocytes, UA NEGATIVE  NEGATIVE  WET PREP, GENITAL     Status: Abnormal   Collection Time    04/16/12 12:50 PM      Result Value Range   Yeast Wet Prep HPF POC NONE SEEN  NONE SEEN   Trich, Wet Prep NONE SEEN  NONE SEEN   Clue Cells Wet Prep HPF POC FEW (*) NONE SEEN   WBC, Wet Prep HPF POC MANY (*) NONE SEEN   speculum exam revealed no pooling of fluid- no clinical evidence of LOF  Assessment and Plan  BV-Flagyl 500mg  BID for 7 days F/u with OB appointment  [facility] 04/16/2012, 12:36 PM

## 2012-04-25 ENCOUNTER — Encounter: Payer: Self-pay | Admitting: *Deleted

## 2012-04-25 ENCOUNTER — Encounter: Payer: Self-pay | Admitting: Advanced Practice Midwife

## 2012-04-25 NOTE — MAU Provider Note (Signed)
Attestation of Attending Supervision of Advanced Practitioner (CNM/NP): Evaluation and management procedures were performed by the Advanced Practitioner under my supervision and collaboration. I have reviewed the Advanced Practitioner's note and chart, and I agree with the management and plan.  Pola Furno H. 11:47 AM   

## 2012-04-29 NOTE — MAU Provider Note (Signed)
Attestation of Attending Supervision of Advanced Practitioner (CNM/NP): Evaluation and management procedures were performed by the Advanced Practitioner under my supervision and collaboration. I have reviewed the Advanced Practitioner's note and chart, and I agree with the management and plan.  LEGGETT,KELLY H. 12:49 PM

## 2012-05-03 ENCOUNTER — Ambulatory Visit (HOSPITAL_COMMUNITY)
Admission: RE | Admit: 2012-05-03 | Discharge: 2012-05-03 | Disposition: A | Discharge status: Home / Self Care | Payer: Medicaid Other | Source: Ambulatory Visit | Attending: Advanced Practice Midwife | Admitting: Advanced Practice Midwife

## 2012-05-03 DIAGNOSIS — O99210 Obesity complicating pregnancy, unspecified trimester: Secondary | ICD-10-CM | POA: Insufficient documentation

## 2012-05-03 DIAGNOSIS — O09299 Supervision of pregnancy with other poor reproductive or obstetric history, unspecified trimester: Secondary | ICD-10-CM | POA: Insufficient documentation

## 2012-05-03 DIAGNOSIS — Z1389 Encounter for screening for other disorder: Secondary | ICD-10-CM | POA: Insufficient documentation

## 2012-05-03 DIAGNOSIS — O358XX0 Maternal care for other (suspected) fetal abnormality and damage, not applicable or unspecified: Secondary | ICD-10-CM | POA: Insufficient documentation

## 2012-05-03 DIAGNOSIS — Z3492 Encounter for supervision of normal pregnancy, unspecified, second trimester: Secondary | ICD-10-CM

## 2012-05-03 DIAGNOSIS — Z363 Encounter for antenatal screening for malformations: Secondary | ICD-10-CM | POA: Insufficient documentation

## 2012-05-03 DIAGNOSIS — E669 Obesity, unspecified: Secondary | ICD-10-CM | POA: Insufficient documentation

## 2012-05-06 ENCOUNTER — Encounter: Payer: Self-pay | Admitting: Advanced Practice Midwife

## 2012-05-10 ENCOUNTER — Other Ambulatory Visit: Payer: Self-pay | Admitting: Obstetrics & Gynecology

## 2012-05-10 ENCOUNTER — Ambulatory Visit (INDEPENDENT_AMBULATORY_CARE_PROVIDER_SITE_OTHER): Payer: Medicaid Other | Admitting: Obstetrics & Gynecology

## 2012-05-10 VITALS — BP 115/79 | Temp 98.7°F | Wt 260.4 lb

## 2012-05-10 DIAGNOSIS — O26899 Other specified pregnancy related conditions, unspecified trimester: Secondary | ICD-10-CM

## 2012-05-10 DIAGNOSIS — O210 Mild hyperemesis gravidarum: Secondary | ICD-10-CM

## 2012-05-10 DIAGNOSIS — J45909 Unspecified asthma, uncomplicated: Secondary | ICD-10-CM | POA: Insufficient documentation

## 2012-05-10 DIAGNOSIS — O99519 Diseases of the respiratory system complicating pregnancy, unspecified trimester: Secondary | ICD-10-CM

## 2012-05-10 LAB — POCT URINALYSIS DIP (DEVICE)
Bilirubin Urine: NEGATIVE
Glucose, UA: NEGATIVE mg/dL
Nitrite: NEGATIVE
Urobilinogen, UA: 0.2 mg/dL (ref 0.0–1.0)

## 2012-05-10 MED ORDER — POLYETHYLENE GLYCOL 3350 17 GM/SCOOP PO POWD: 17.0000 g | Freq: Every day | ORAL | Status: DC

## 2012-05-10 MED ORDER — DOCUSATE SODIUM 100 MG PO CAPS: 100.0000 mg | ORAL_CAPSULE | Freq: Two times a day (BID) | ORAL | Status: DC

## 2012-05-10 NOTE — Progress Notes (Signed)
Ultrasound scheduled for 05/17/12 at 1245pm.

## 2012-05-10 NOTE — Patient Instructions (Signed)
Levonorgestrel intrauterine device (IUD) What is this medicine? LEVONORGESTREL IUD (LEE voe nor jes trel) is a contraceptive (birth control) device. It is used to prevent pregnancy and to treat heavy bleeding that occurs during your period. It can be used for up to 5 years. This medicine may be used for other purposes; ask your health care provider or pharmacist if you have questions. What should I tell my health care provider before I take this medicine? They need to know if you have any of these conditions: -abnormal Pap smear -cancer of the breast, uterus, or cervix -diabetes -endometritis -genital or pelvic infection now or in the past -have more than one sexual partner or your partner has more than one partner -heart disease -history of an ectopic or tubal pregnancy -immune system problems -IUD in place -liver disease or tumor -problems with blood clots or take blood-thinners -use intravenous drugs -uterus of unusual shape -vaginal bleeding that has not been explained -an unusual or allergic reaction to levonorgestrel, other hormones, silicone, or polyethylene, medicines, foods, dyes, or preservatives -pregnant or trying to get pregnant -breast-feeding How should I use this medicine? This device is placed inside the uterus by a health care professional. Talk to your pediatrician regarding the use of this medicine in children. Special care may be needed. Overdosage: If you think you have taken too much of this medicine contact a poison control center or emergency room at once. NOTE: This medicine is only for you. Do not share this medicine with others. What if I miss a dose? This does not apply. What may interact with this medicine? Do not take this medicine with any of the following medications: -amprenavir -bosentan -fosamprenavir This medicine may also interact with the following medications: -aprepitant -barbiturate medicines for inducing sleep or treating  seizures -bexarotene -griseofulvin -medicines to treat seizures like carbamazepine, ethotoin, felbamate, oxcarbazepine, phenytoin, topiramate -modafinil -pioglitazone -rifabutin -rifampin -rifapentine -some medicines to treat HIV infection like atazanavir, indinavir, lopinavir, nelfinavir, tipranavir, ritonavir -St. John's wort -warfarin This list may not describe all possible interactions. Give your health care provider a list of all the medicines, herbs, non-prescription drugs, or dietary supplements you use. Also tell them if you smoke, drink alcohol, or use illegal drugs. Some items may interact with your medicine. What should I watch for while using this medicine? Visit your doctor or health care professional for regular check ups. See your doctor if you or your partner has sexual contact with others, becomes HIV positive, or gets a sexual transmitted disease. This product does not protect you against HIV infection (AIDS) or other sexually transmitted diseases. You can check the placement of the IUD yourself by reaching up to the top of your vagina with clean fingers to feel the threads. Do not pull on the threads. It is a good habit to check placement after each menstrual period. Call your doctor right away if you feel more of the IUD than just the threads or if you cannot feel the threads at all. The IUD may come out by itself. You may become pregnant if the device comes out. If you notice that the IUD has come out use a backup birth control method like condoms and call your health care provider. Using tampons will not change the position of the IUD and are okay to use during your period. What side effects may I notice from receiving this medicine? Side effects that you should report to your doctor or health care professional as soon as possible: -allergic reactions   like skin rash, itching or hives, swelling of the face, lips, or tongue -fever, flu-like symptoms -genital sores -high  blood pressure -no menstrual period for 6 weeks during use -pain, swelling, warmth in the leg -pelvic pain or tenderness -severe or sudden headache -signs of pregnancy -stomach cramping -sudden shortness of breath -trouble with balance, talking, or walking -unusual vaginal bleeding, discharge -yellowing of the eyes or skin Side effects that usually do not require medical attention (report to your doctor or health care professional if they continue or are bothersome): -acne -breast pain -change in sex drive or performance -changes in weight -cramping, dizziness, or faintness while the device is being inserted -headache -irregular menstrual bleeding within first 3 to 6 months of use -nausea This list may not describe all possible side effects. Call your doctor for medical advice about side effects. You may report side effects to FDA at 1-800-FDA-1088. Where should I keep my medicine? This does not apply. NOTE: This sheet is a summary. It may not cover all possible information. If you have questions about this medicine, talk to your doctor, pharmacist, or health care provider.  2012, [employer]. (02/08/2008 6:39:08 PM)Contraception Choices Contraception (birth control) is the use of any methods or devices to prevent pregnancy. Below are some methods to help avoid pregnancy. HORMONAL METHODS   Contraceptive implant. This is a thin, plastic tube containing progesterone hormone. It does not contain estrogen hormone. Your caregiver inserts the tube in the inner part of the upper arm. The tube can remain in place for up to 3 years. After 3 years, the implant must be removed. The implant prevents the ovaries from releasing an egg (ovulation), thickens the cervical mucus which prevents sperm from entering the uterus, and thins the lining of the inside of the uterus.  Progesterone-only injections. These injections are given every 3 months by your caregiver to prevent pregnancy. This  synthetic progesterone hormone stops the ovaries from releasing eggs. It also thickens cervical mucus and changes the uterine lining. This makes it harder for sperm to survive in the uterus.  Birth control pills. These pills contain estrogen and progesterone hormone. They work by stopping the egg from forming in the ovary (ovulation). Birth control pills are prescribed by a caregiver.Birth control pills can also be used to treat heavy periods.  Minipill. This type of birth control pill contains only the progesterone hormone. They are taken every day of each month and must be prescribed by your caregiver.  Birth control patch. The patch contains hormones similar to those in birth control pills. It must be changed once a week and is prescribed by a caregiver.  Vaginal ring. The ring contains hormones similar to those in birth control pills. It is left in the vagina for 3 weeks, removed for 1 week, and then a new one is put back in place. The patient must be comfortable inserting and removing the ring from the vagina.A caregiver's prescription is necessary.  Emergency contraception. Emergency contraceptives prevent pregnancy after unprotected sexual intercourse. This pill can be taken right after sex or up to 5 days after unprotected sex. It is most effective the sooner you take the pills after having sexual intercourse. Emergency contraceptive pills are available without a prescription. Check with your pharmacist. Do not use emergency contraception as your only form of birth control. BARRIER METHODS   Female condom. This is a thin sheath (latex or rubber) that is worn over the penis during sexual intercourse. It can be used with spermicide  to increase effectiveness.  Female condom. This is a soft, loose-fitting sheath that is put into the vagina before sexual intercourse.  Diaphragm. This is a soft, latex, dome-shaped barrier that must be fitted by a caregiver. It is inserted into the vagina, along  with a spermicidal jelly. It is inserted before intercourse. The diaphragm should be left in the vagina for 6 to 8 hours after intercourse.  Cervical cap. This is a round, soft, latex or plastic cup that fits over the cervix and must be fitted by a caregiver. The cap can be left in place for up to 48 hours after intercourse.  Sponge. This is a soft, circular piece of polyurethane foam. The sponge has spermicide in it. It is inserted into the vagina after wetting it and before sexual intercourse.  Spermicides. These are chemicals that kill or block sperm from entering the cervix and uterus. They come in the form of creams, jellies, suppositories, foam, or tablets. They do not require a prescription. They are inserted into the vagina with an applicator before having sexual intercourse. The process must be repeated every time you have sexual intercourse. INTRAUTERINE CONTRACEPTION  Intrauterine device (IUD). This is a T-shaped device that is put in a woman's uterus during a menstrual period to prevent pregnancy. There are 2 types:  Copper IUD. This type of IUD is wrapped in copper wire and is placed inside the uterus. Copper makes the uterus and fallopian tubes produce a fluid that kills sperm. It can stay in place for 10 years.  Hormone IUD. This type of IUD contains the hormone progestin (synthetic progesterone). The hormone thickens the cervical mucus and prevents sperm from entering the uterus, and it also thins the uterine lining to prevent implantation of a fertilized egg. The hormone can weaken or kill the sperm that get into the uterus. It can stay in place for 5 years. PERMANENT METHODS OF CONTRACEPTION  Female tubal ligation. This is when the woman's fallopian tubes are surgically sealed, tied, or blocked to prevent the egg from traveling to the uterus.  Female sterilization. This is when the female has the tubes that carry sperm tied off (vasectomy).This blocks sperm from entering the vagina  during sexual intercourse. After the procedure, the man can still ejaculate fluid (semen). NATURAL PLANNING METHODS  Natural family planning. This is not having sexual intercourse or using a barrier method (condom, diaphragm, cervical cap) on days the woman could become pregnant.  Calendar method. This is keeping track of the length of each menstrual cycle and identifying when you are fertile.  Ovulation method. This is avoiding sexual intercourse during ovulation.  Symptothermal method. This is avoiding sexual intercourse during ovulation, using a thermometer and ovulation symptoms.  Post-ovulation method. This is timing sexual intercourse after you have ovulated. Regardless of which type or method of contraception you choose, it is important that you use condoms to protect against the transmission of sexually transmitted diseases (STDs). Talk with your caregiver about which form of contraception is most appropriate for you. Document Released: 01/17/2005 Document Revised: 04/11/2011 Document Reviewed: 05/26/2010 [facility] Patient Information 2013 [place], [place].

## 2012-05-10 NOTE — Progress Notes (Signed)
Pulse- 79  Edema-feet 

## 2012-05-12 NOTE — Progress Notes (Signed)
Pt doing well.  Using rescue inhaler 1x/wk.  Getting f/u anatomy for poor views of face and diaphragm.

## 2012-05-17 ENCOUNTER — Ambulatory Visit (HOSPITAL_COMMUNITY)
Admission: RE | Admit: 2012-05-17 | Discharge: 2012-05-17 | Disposition: A | Discharge status: Home / Self Care | Payer: Medicaid Other | Source: Ambulatory Visit | Attending: Obstetrics & Gynecology | Admitting: Obstetrics & Gynecology

## 2012-05-17 DIAGNOSIS — E669 Obesity, unspecified: Secondary | ICD-10-CM | POA: Insufficient documentation

## 2012-05-17 DIAGNOSIS — O26899 Other specified pregnancy related conditions, unspecified trimester: Secondary | ICD-10-CM

## 2012-05-17 DIAGNOSIS — O09299 Supervision of pregnancy with other poor reproductive or obstetric history, unspecified trimester: Secondary | ICD-10-CM | POA: Insufficient documentation

## 2012-05-17 DIAGNOSIS — Z3689 Encounter for other specified antenatal screening: Secondary | ICD-10-CM | POA: Insufficient documentation

## 2012-05-20 ENCOUNTER — Encounter: Payer: Self-pay | Admitting: Obstetrics & Gynecology

## 2012-05-22 ENCOUNTER — Telehealth: Payer: Self-pay

## 2012-05-22 NOTE — Telephone Encounter (Signed)
Patient called in because she has a rash on her breasts and it seems to be spreading.  She has had it for about a week.  Her next appointment is 06/07/12.

## 2012-05-22 NOTE — Telephone Encounter (Signed)
I returned pt's call and discussed her concern. She states that she has a rash on the front of her Lt breast which is spreading and now has started on the other breast. It is pinpoint in size and itches. She denies having recently changed laundry detergent or other soaps. She also denies any other family member with similar rash. She has dogs but states they are on flea prevention medication. I offered pt appt tomorrow @ 1430 and she agreed. She understands this is a work-in appt.

## 2012-05-23 ENCOUNTER — Ambulatory Visit (INDEPENDENT_AMBULATORY_CARE_PROVIDER_SITE_OTHER): Payer: Medicaid Other | Admitting: Family Medicine

## 2012-05-23 VITALS — BP 113/77 | Temp 98.9°F | Wt 265.2 lb

## 2012-05-23 DIAGNOSIS — B372 Candidiasis of skin and nail: Secondary | ICD-10-CM | POA: Insufficient documentation

## 2012-05-23 DIAGNOSIS — J45909 Unspecified asthma, uncomplicated: Secondary | ICD-10-CM

## 2012-05-23 MED ORDER — NYSTATIN 100000 UNIT/GM EX CREA: TOPICAL_CREAM | Freq: Two times a day (BID) | CUTANEOUS | Status: DC

## 2012-05-23 MED ORDER — ALBUTEROL SULFATE HFA 108 (90 BASE) MCG/ACT IN AERS: 2.0000 | INHALATION_SPRAY | Freq: Four times a day (QID) | RESPIRATORY_TRACT | Status: DC | PRN

## 2012-05-23 NOTE — Progress Notes (Signed)
C/o breast itching. No mass.  Rash on tops of breasts.  No change in deodorant, detergents, lotions, etc.  Changes bras daily. Needs Albuterol inhaler.

## 2012-05-23 NOTE — Progress Notes (Signed)
Pulse: 85  Has a rash on her breasts, started 1 week ago, very itchy. Has tried using cocoa butter on it but not helping.

## 2012-05-23 NOTE — Patient Instructions (Addendum)
Pregnancy - Second Trimester The second trimester of pregnancy (3 to 6 months) is a period of rapid growth for you and your baby. At the end of the sixth month, your baby is about 9 inches long and weighs 1 1/2 pounds. You will begin to feel the baby move between 18 and 20 weeks of the pregnancy. This is called quickening. Weight gain is faster. A clear fluid (colostrum) may leak out of your breasts. You may feel small contractions of the womb (uterus). This is known as false labor or Braxton-Hicks contractions. This is like a practice for labor when the baby is ready to be born. Usually, the problems with morning sickness have usually passed by the end of your first trimester. Some women develop small dark blotches (called cholasma, mask of pregnancy) on their face that usually goes away after the baby is born. Exposure to the sun makes the blotches worse. Acne may also develop in some pregnant women and pregnant women who have acne, may find that it goes away. PRENATAL EXAMS  Blood work may continue to be done during prenatal exams. These tests are done to check on your health and the probable health of your baby. Blood work is used to follow your blood levels (hemoglobin). Anemia (low hemoglobin) is common during pregnancy. Iron and vitamins are given to help prevent this. You will also be checked for diabetes between 24 and 28 weeks of the pregnancy. Some of the previous blood tests may be repeated.  The size of the uterus is measured during each visit. This is to make sure that the baby is continuing to grow properly according to the dates of the pregnancy.  Your blood pressure is checked every prenatal visit. This is to make sure you are not getting toxemia.  Your urine is checked to make sure you do not have an infection, diabetes or protein in the urine.  Your weight is checked often to make sure gains are happening at the suggested rate. This is to ensure that both you and your baby are  growing normally.  Sometimes, an ultrasound is performed to confirm the proper growth and development of the baby. This is a test which bounces harmless sound waves off the baby so your caregiver can more accurately determine due dates. Sometimes, a specialized test is done on the amniotic fluid surrounding the baby. This test is called an amniocentesis. The amniotic fluid is obtained by sticking a needle into the belly (abdomen). This is done to check the chromosomes in instances where there is a concern about possible genetic problems with the baby. It is also sometimes done near the end of pregnancy if an early delivery is required. In this case, it is done to help make sure the baby's lungs are mature enough for the baby to live outside of the womb. CHANGES OCCURING IN THE SECOND TRIMESTER OF PREGNANCY Your body goes through many changes during pregnancy. They vary from person to person. Talk to your caregiver about changes you notice that you are concerned about.  During the second trimester, you will likely have an increase in your appetite. It is normal to have cravings for certain foods. This varies from person to person and pregnancy to pregnancy.  Your lower abdomen will begin to bulge.  You may have to urinate more often because the uterus and baby are pressing on your bladder. It is also common to get more bladder infections during pregnancy (pain with urination). You can help this by  drinking lots of fluids and emptying your bladder before and after intercourse.  You may begin to get stretch marks on your hips, abdomen, and breasts. These are normal changes in the body during pregnancy. There are no exercises or medications to take that prevent this change.  You may begin to develop swollen and bulging veins (varicose veins) in your legs. Wearing support hose, elevating your feet for 15 minutes, 3 to 4 times a day and limiting salt in your diet helps lessen the problem.  Heartburn may  develop as the uterus grows and pushes up against the stomach. Antacids recommended by your caregiver helps with this problem. Also, eating smaller meals 4 to 5 times a day helps.  Constipation can be treated with a stool softener or adding bulk to your diet. Drinking lots of fluids, vegetables, fruits, and whole grains are helpful.  Exercising is also helpful. If you have been very active up until your pregnancy, most of these activities can be continued during your pregnancy. If you have been less active, it is helpful to start an exercise program such as walking.  Hemorrhoids (varicose veins in the rectum) may develop at the end of the second trimester. Warm sitz baths and hemorrhoid cream recommended by your caregiver helps hemorrhoid problems.  Backaches may develop during this time of your pregnancy. Avoid heavy lifting, wear low heal shoes and practice good posture to help with backache problems.  Some pregnant women develop tingling and numbness of their hand and fingers because of swelling and tightening of ligaments in the wrist (carpel tunnel syndrome). This goes away after the baby is born.  As your breasts enlarge, you may have to get a bigger bra. Get a comfortable, cotton, support bra. Do not get a nursing bra until the last month of the pregnancy if you will be nursing the baby.  You may get a dark line from your belly button to the pubic area called the linea nigra.  You may develop rosy cheeks because of increase blood flow to the face.  You may develop spider looking lines of the face, neck, arms and chest. These go away after the baby is born. HOME CARE INSTRUCTIONS   It is extremely important to avoid all smoking, herbs, alcohol, and unprescribed drugs during your pregnancy. These chemicals affect the formation and growth of the baby. Avoid these chemicals throughout the pregnancy to ensure the delivery of a healthy infant.  Most of your home care instructions are the same  as suggested for the first trimester of your pregnancy. Keep your caregiver's appointments. Follow your caregiver's instructions regarding medication use, exercise and diet.  During pregnancy, you are providing food for you and your baby. Continue to eat regular, well-balanced meals. Choose foods such as meat, fish, milk and other low fat dairy products, vegetables, fruits, and whole-grain breads and cereals. Your caregiver will tell you of the ideal weight gain.  A physical sexual relationship may be continued up until near the end of pregnancy if there are no other problems. Problems could include early (premature) leaking of amniotic fluid from the membranes, vaginal bleeding, abdominal pain, or other medical or pregnancy problems.  Exercise regularly if there are no restrictions. Check with your caregiver if you are unsure of the safety of some of your exercises. The greatest weight gain will occur in the last 2 trimesters of pregnancy. Exercise will help you:  Control your weight.  Get you in shape for labor and delivery.  Lose weight  after you have the baby.  Wear a good support or jogging bra for breast tenderness during pregnancy. This may help if worn during sleep. Pads or tissues may be used in the bra if you are leaking colostrum.  Do not use hot tubs, steam rooms or saunas throughout the pregnancy.  Wear your seat belt at all times when driving. This protects you and your baby if you are in an accident.  Avoid raw meat, uncooked cheese, cat litter boxes and soil used by cats. These carry germs that can cause birth defects in the baby.  The second trimester is also a good time to visit your dentist for your dental health if this has not been done yet. Getting your teeth cleaned is OK. Use a soft toothbrush. Brush gently during pregnancy.  It is easier to loose urine during pregnancy. Tightening up and strengthening the pelvic muscles will help with this problem. Practice stopping  your urination while you are going to the bathroom. These are the same muscles you need to strengthen. It is also the muscles you would use as if you were trying to stop from passing gas. You can practice tightening these muscles up 10 times a set and repeating this about 3 times per day. Once you know what muscles to tighten up, do not perform these exercises during urination. It is more likely to contribute to an infection by backing up the urine.  Ask for help if you have financial, counseling or nutritional needs during pregnancy. Your caregiver will be able to offer counseling for these needs as well as refer you for other special needs.  Your skin may become oily. If so, wash your face with mild soap, use non-greasy moisturizer and oil or cream based makeup. MEDICATIONS AND DRUG USE IN PREGNANCY  Take prenatal vitamins as directed. The vitamin should contain 1 milligram of folic acid. Keep all vitamins out of reach of children. Only a couple vitamins or tablets containing iron may be fatal to a baby or young child when ingested.  Avoid use of all medications, including herbs, over-the-counter medications, not prescribed or suggested by your caregiver. Only take over-the-counter or prescription medicines for pain, discomfort, or fever as directed by your caregiver. Do not use aspirin.  Let your caregiver also know about herbs you may be using.  Alcohol is related to a number of birth defects. This includes fetal alcohol syndrome. All alcohol, in any form, should be avoided completely. Smoking will cause low birth rate and premature babies.  Street or illegal drugs are very harmful to the baby. They are absolutely forbidden. A baby born to an addicted mother will be addicted at birth. The baby will go through the same withdrawal an adult does. SEEK MEDICAL CARE IF:  You have any concerns or worries during your pregnancy. It is better to call with your questions if you feel they cannot wait,  rather than worry about them. SEEK IMMEDIATE MEDICAL CARE IF:   An unexplained oral temperature above 102 F (38.9 C) develops, or as your caregiver suggests.  You have leaking of fluid from the vagina (birth canal). If leaking membranes are suspected, take your temperature and tell your caregiver of this when you call.  There is vaginal spotting, bleeding, or passing clots. Tell your caregiver of the amount and how many pads are used. Light spotting in pregnancy is common, especially following intercourse.  You develop a bad smelling vaginal discharge with a change in the color from clear  to white.  You continue to feel sick to your stomach (nauseated) and have no relief from remedies suggested. You vomit blood or coffee ground-like materials.  You lose more than 2 pounds of weight or gain more than 2 pounds of weight over 1 week, or as suggested by your caregiver.  You notice swelling of your face, hands, feet, or legs.  You get exposed to [place] measles and have never had them.  You are exposed to fifth disease or chickenpox.  You develop belly (abdominal) pain. Round ligament discomfort is a common non-cancerous (benign) cause of abdominal pain in pregnancy. Your caregiver still must evaluate you.  You develop a bad headache that does not go away.  You develop fever, diarrhea, pain with urination, or shortness of breath.  You develop visual problems, blurry, or double vision.  You fall or are in a car accident or any kind of trauma.  There is mental or physical violence at home. Document Released: 01/11/2001 Document Revised: 04/11/2011 Document Reviewed: 07/16/2008 [facility] Patient Information 2013 [place], [place].  Breastfeeding Deciding to breastfeed is one of the best choices you can make for you and your baby. The information that follows gives a brief overview of the benefits of breastfeeding as well as common topics surrounding breastfeeding. BENEFITS OF  BREASTFEEDING For the baby  The first milk (colostrum) helps the baby's digestive system function better.   There are antibodies in the mother's milk that help the baby fight off infections.   The baby has a lower incidence of asthma, allergies, and sudden infant death syndrome (SIDS).   The nutrients in breast milk are better for the baby than infant formulas, and breast milk helps the baby's brain grow better.   Babies who breastfeed have less gas, colic, and constipation.  For the mother  Breastfeeding helps develop a very special bond between the mother and her baby.   Breastfeeding is convenient, always available at the correct temperature, and costs nothing.   Breastfeeding burns calories in the mother and helps her lose weight that was gained during pregnancy.   Breastfeeding makes the uterus contract back down to normal size faster and slows bleeding following delivery.   Breastfeeding mothers have a lower risk of developing breast cancer.  BREASTFEEDING FREQUENCY  A healthy, full-term baby may breastfeed as often as every hour or space his or her feedings to every 3 hours.   Watch your baby for signs of hunger. Nurse your baby if he or she shows signs of hunger. How often you nurse will vary from baby to baby.   Nurse as often as the baby requests, or when you feel the need to reduce the fullness of your breasts.   Awaken the baby if it has been 3 4 hours since the last feeding.   Frequent feeding will help the mother make more milk and will help prevent problems, such as sore nipples and engorgement of the breasts.  BABY'S POSITION AT THE BREAST  Whether lying down or sitting, be sure that the baby's tummy is facing your tummy.   Support the breast with 4 fingers underneath the breast and the thumb above. Make sure your fingers are well away from the nipple and baby's mouth.   Stroke the baby's lips gently with your finger or nipple.   When the  baby's mouth is open wide enough, place all of your nipple and as much of the areola as possible into your baby's mouth.   Pull the baby in  close so the tip of the nose and the baby's cheeks touch the breast during the feeding.  FEEDINGS AND SUCTION  The length of each feeding varies from baby to baby and from feeding to feeding.   The baby must suck about 2 3 minutes for your milk to get to him or her. This is called a "let down." For this reason, allow the baby to feed on each breast as long as he or she wants. Your baby will end the feeding when he or she has received the right balance of nutrients.   To break the suction, put your finger into the corner of the baby's mouth and slide it between his or her gums before removing your breast from his or her mouth. This will help prevent sore nipples.  HOW TO TELL WHETHER YOUR BABY IS GETTING ENOUGH BREAST MILK. Wondering whether or not your baby is getting enough milk is a common concern among mothers. You can be assured that your baby is getting enough milk if:   Your baby is actively sucking and you hear swallowing.   Your baby seems relaxed and satisfied after a feeding.   Your baby nurses at least 8 12 times in a 24 hour time period. Nurse your baby until he or she unlatches or falls asleep at the first breast (at least 10 20 minutes), then offer the second side.   Your baby is wetting 5 6 disposable diapers (6 8 cloth diapers) in a 24 hour period by 58 39 days of age.   Your baby is having at least 3 4 stools every 24 hours for the first 6 weeks. The stool should be soft and yellow.   Your baby should gain 4 7 ounces per week after he or she is 45 days old.   Your breasts feel softer after nursing.  REDUCING BREAST ENGORGEMENT  In the first week after your baby is born, you may experience signs of breast engorgement. When breasts are engorged, they feel heavy, warm, full, and may be tender to the touch. You can reduce  engorgement if you:   Nurse frequently, every 2 3 hours. Mothers who breastfeed early and often have fewer problems with engorgement.   Place light ice packs on your breasts for 10 20 minutes between feedings. This reduces swelling. Wrap the ice packs in a lightweight towel to protect your skin. Bags of frozen vegetables work well for this purpose.   Take a warm shower or apply warm, moist heat to your breast for 5 10 minutes just before each feeding. This increases circulation and helps the milk flow.   Gently massage your breast before and during the feeding. Using your finger tips, massage from the chest wall towards your nipple in a circular motion.   Make sure that the baby empties at least one breast at every feeding before switching sides.   Use a breast pump to empty the breasts if your baby is sleepy or not nursing well. You may also want to pump if you are returning to work oryou feel you are getting engorged.   Avoid bottle feeds, pacifiers, or supplemental feedings of water or juice in place of breastfeeding. Breast milk is all the food your baby needs. It is not necessary for your baby to have water or formula. In fact, to help your breasts make more milk, it is best not to give your baby supplemental feedings during the early weeks.   Be sure the baby is latched  on and positioned properly while breastfeeding.   Wear a supportive bra, avoiding underwire styles.   Eat a balanced diet with enough fluids.   Rest often, relax, and take your prenatal vitamins to prevent fatigue, stress, and anemia.  If you follow these suggestions, your engorgement should improve in 24 48 hours. If you are still experiencing difficulty, call your lactation consultant or caregiver.  CARING FOR YOURSELF Take care of your breasts  Bathe or shower daily.   Avoid using soap on your nipples.   Start feedings on your left breast at one feeding and on your right breast at the next  feeding.   You will notice an increase in your milk supply 2 5 days after delivery. You may feel some discomfort from engorgement, which makes your breasts very firm and often tender. Engorgement "peaks" out within 24 48 hours. In the meantime, apply warm moist towels to your breasts for 5 10 minutes before feeding. Gentle massage and expression of some milk before feeding will soften your breasts, making it easier for your baby to latch on.   Wear a well-fitting nursing bra, and air dry your nipples for a 3 after each feeding.   Only use cotton bra pads.   Only use pure lanolin on your nipples after nursing. You do not need to wash it off before feeding the baby again. Another option is to express a few drops of breast milk and gently massage it into your nipples.  Take care of yourself  Eat well-balanced meals and nutritious snacks.   Drinking milk, fruit juice, and water to satisfy your thirst (about 8 glasses a day).   Get plenty of rest.  Avoid foods that you notice affect the baby in a bad way.  SEEK MEDICAL CARE IF:   You have difficulty with breastfeeding and need help.   You have a hard, red, sore area on your breast that is accompanied by a fever.   Your baby is too sleepy to eat well or is having trouble sleeping.   Your baby is wetting less than 6 diapers a day, by 47 days of age.   Your baby's skin or white part of his or her eyes is more yellow than it was in the hospital.   You feel depressed.  Document Released: 01/17/2005 Document Revised: 07/19/2011 Document Reviewed: 04/17/2011 [facility] Patient Information 2013 [place], [place]. Yeast Infection of the Skin Some yeast on the skin is normal, but sometimes it causes an infection. If you have a yeast infection, it shows up as white or light brown patches on brown skin. You can see it better in the summer on tan skin. It causes light-colored holes in your suntan. It can happen on any area of the  body. This cannot be passed from person to person. HOME CARE  Scrub your skin daily with a dandruff shampoo. Your rash may take a couple weeks to get well.  Do not scratch or itch the rash. GET HELP RIGHT AWAY IF:   You get another infection from scratching. The skin may get warm, red, and may ooze fluid.  The infection does not seem to be getting better. MAKE SURE YOU:  Understand these instructions.  Will watch your condition.  Will get help right away if you are not doing well or get worse. Document Released: 12/31/2007 Document Revised: 04/11/2011 Document Reviewed: 12/31/2007 [facility] Patient Information 2013 [place], [place].

## 2012-05-31 ENCOUNTER — Telehealth: Payer: Self-pay

## 2012-05-31 DIAGNOSIS — B372 Candidiasis of skin and nail: Secondary | ICD-10-CM

## 2012-05-31 NOTE — Telephone Encounter (Signed)
Pt stated that she was prescribed nystatin cream that is not working could she have something else.  Called pt and left message to return the call to the clinics.

## 2012-06-01 MED ORDER — MICONAZOLE NITRATE 2 % EX CREA: TOPICAL_CREAM | Freq: Two times a day (BID) | CUTANEOUS | Status: DC

## 2012-06-01 NOTE — Telephone Encounter (Signed)
Med ordered for miconazole cream. Called patient, no answer- left message to call us back

## 2012-06-01 NOTE — Telephone Encounter (Signed)
Patient called back after receiving our voicemail. Informed patient I received her message and got her a different Rx for a different cream to try and sent that Rx to her [facility] pharmacy. Patient verbalized understanding and had no further questions

## 2012-06-07 ENCOUNTER — Other Ambulatory Visit: Payer: Self-pay | Admitting: Obstetrics and Gynecology

## 2012-06-07 ENCOUNTER — Encounter: Payer: Self-pay | Admitting: Obstetrics and Gynecology

## 2012-06-07 ENCOUNTER — Ambulatory Visit (INDEPENDENT_AMBULATORY_CARE_PROVIDER_SITE_OTHER): Payer: Medicaid Other | Admitting: Obstetrics and Gynecology

## 2012-06-07 VITALS — BP 119/74 | Wt 267.5 lb

## 2012-06-07 DIAGNOSIS — E669 Obesity, unspecified: Secondary | ICD-10-CM

## 2012-06-07 DIAGNOSIS — O26899 Other specified pregnancy related conditions, unspecified trimester: Secondary | ICD-10-CM

## 2012-06-07 DIAGNOSIS — O99210 Obesity complicating pregnancy, unspecified trimester: Secondary | ICD-10-CM

## 2012-06-07 DIAGNOSIS — J45909 Unspecified asthma, uncomplicated: Secondary | ICD-10-CM

## 2012-06-07 DIAGNOSIS — O210 Mild hyperemesis gravidarum: Secondary | ICD-10-CM

## 2012-06-07 LAB — POCT URINALYSIS DIP (DEVICE)
Bilirubin Urine: NEGATIVE
Glucose, UA: NEGATIVE mg/dL
Hgb urine dipstick: NEGATIVE
Ketones, ur: NEGATIVE mg/dL
Nitrite: NEGATIVE
Protein, ur: NEGATIVE mg/dL
Specific Gravity, Urine: 1.02 (ref 1.005–1.030)
Urobilinogen, UA: 0.2 mg/dL (ref 0.0–1.0)
pH: 7 (ref 5.0–8.0)

## 2012-06-07 MED ORDER — POLYETHYLENE GLYCOL 3350 17 GM/SCOOP PO POWD: 17.0000 g | Freq: Every day | ORAL | Status: DC

## 2012-06-07 NOTE — Progress Notes (Signed)
Patient doing well without complaints. FM/PTL precautions reviewed 

## 2012-06-07 NOTE — Progress Notes (Signed)
Pulse- [phone] 

## 2012-06-28 ENCOUNTER — Ambulatory Visit (INDEPENDENT_AMBULATORY_CARE_PROVIDER_SITE_OTHER): Payer: Medicaid Other | Admitting: Family Medicine

## 2012-06-28 VITALS — BP 118/78 | Wt 271.6 lb

## 2012-06-28 DIAGNOSIS — R110 Nausea: Secondary | ICD-10-CM

## 2012-06-28 DIAGNOSIS — O210 Mild hyperemesis gravidarum: Secondary | ICD-10-CM

## 2012-06-28 DIAGNOSIS — J45909 Unspecified asthma, uncomplicated: Secondary | ICD-10-CM

## 2012-06-28 DIAGNOSIS — Z23 Encounter for immunization: Secondary | ICD-10-CM

## 2012-06-28 DIAGNOSIS — O99519 Diseases of the respiratory system complicating pregnancy, unspecified trimester: Secondary | ICD-10-CM

## 2012-06-28 LAB — POCT URINALYSIS DIP (DEVICE)
Glucose, UA: 100 mg/dL — AB
Ketones, ur: NEGATIVE mg/dL
Protein, ur: 30 mg/dL — AB
Specific Gravity, Urine: 1.025 (ref 1.005–1.030)
Urobilinogen, UA: 0.2 mg/dL (ref 0.0–1.0)

## 2012-06-28 LAB — CBC
HCT: 34.2 % — ABNORMAL LOW (ref 36.0–46.0)
Hemoglobin: 11.7 g/dL — ABNORMAL LOW (ref 12.0–15.0)
MCHC: 34.2 g/dL (ref 30.0–36.0)
RDW: 13.5 % (ref 11.5–15.5)
WBC: 14.7 10*3/uL — ABNORMAL HIGH (ref 4.0–10.5)

## 2012-06-28 LAB — SYPHILIS: RPR W/REFLEX TO RPR TITER AND TREPONEMAL ANTIBODIES, TRADITIONAL SCREENING AND DIAGNOSIS ALGORITHM

## 2012-06-28 MED ORDER — TETANUS-DIPHTH-ACELL PERTUSSIS 5-2.5-18.5 LF-MCG/0.5 IM SUSP
0.5000 mL | Freq: Once | INTRAMUSCULAR | Status: AC
  Administered 2012-06-28: 0.5 mL via INTRAMUSCULAR

## 2012-06-28 NOTE — Patient Instructions (Signed)
Pregnancy - Third Trimester The third trimester of pregnancy (the last 3 months) is a period of the most rapid growth for you and your baby. The baby approaches a length of 20 inches and a weight of 6 to 10 pounds. The baby is adding on fat and getting ready for life outside your body. While inside, babies have periods of sleeping and waking, sucking thumbs, and hiccuping. You can often feel small contractions of the uterus. This is false labor. It is also called Braxton-Hicks contractions. This is like a practice for labor. The usual problems in this stage of pregnancy include more difficulty breathing, swelling of the hands and feet from water retention, and having to urinate more often because of the uterus and baby pressing on your bladder.  PRENATAL EXAMS  Blood work may continue to be done during prenatal exams. These tests are done to check on your health and the probable health of your baby. Blood work is used to follow your blood levels (hemoglobin). Anemia (low hemoglobin) is common during pregnancy. Iron and vitamins are given to help prevent this. You may also continue to be checked for diabetes. Some of the past blood tests may be done again.  The size of the uterus is measured during each visit. This makes sure your baby is growing properly according to your pregnancy dates.  Your blood pressure is checked every prenatal visit. This is to make sure you are not getting toxemia.  Your urine is checked every prenatal visit for infection, diabetes, and protein.  Your weight is checked at each visit. This is done to make sure gains are happening at the suggested rate and that you and your baby are growing normally.  Sometimes, an ultrasound is performed to confirm the position and the proper growth and development of the baby. This is a test done that bounces harmless sound waves off the baby so your caregiver can more accurately determine a due date.  Discuss the type of pain medicine and  anesthesia you will have during your labor and delivery.  Discuss the possibility and anesthesia if a cesarean section might be necessary.  Inform your caregiver if there is any mental or physical violence at home. Sometimes, a specialized non-stress test, contraction stress test, and biophysical profile are done to make sure the baby is not having a problem. Checking the amniotic fluid surrounding the baby is called an amniocentesis. The amniotic fluid is removed by sticking a needle into the belly (abdomen). This is sometimes done near the end of pregnancy if an early delivery is required. In this case, it is done to help make sure the baby's lungs are mature enough for the baby to live outside of the womb. If the lungs are not mature and it is unsafe to deliver the baby, an injection of cortisone medicine is given to the mother 1 to 2 days before the delivery. This helps the baby's lungs mature and makes it safer to deliver the baby. CHANGES OCCURING IN THE THIRD TRIMESTER OF PREGNANCY Your body goes through many changes during pregnancy. They vary from person to person. Talk to your caregiver about changes you notice and are concerned about.  During the last trimester, you have probably had an increase in your appetite. It is normal to have cravings for certain foods. This varies from person to person and pregnancy to pregnancy.  You may begin to get stretch marks on your hips, abdomen, and breasts. These are normal changes in the body   during pregnancy. There are no exercises or medicines to take which prevent this change.  Constipation may be treated with a stool softener or adding bulk to your diet. Drinking lots of fluids, fiber in vegetables, fruits, and whole grains are helpful.  Exercising is also helpful. If you have been very active up until your pregnancy, most of these activities can be continued during your pregnancy. If you have been less active, it is helpful to start an exercise  program such as walking. Consult your caregiver before starting exercise programs.  Avoid all smoking, alcohol, non-prescribed drugs, herbs and "street drugs" during your pregnancy. These chemicals affect the formation and growth of the baby. Avoid chemicals throughout the pregnancy to ensure the delivery of a healthy infant.  Backache, varicose veins, and hemorrhoids may develop or get worse.  You will tire more easily in the third trimester, which is normal.  The baby's movements may be stronger and more often.  You may become short of breath easily.  Your belly button may stick out.  A yellow discharge may leak from your breasts called colostrum.  You may have a bloody mucus discharge. This usually occurs a few days to a week before labor begins. HOME CARE INSTRUCTIONS   Keep your caregiver's appointments. Follow your caregiver's instructions regarding medicine use, exercise, and diet.  During pregnancy, you are providing food for you and your baby. Continue to eat regular, well-balanced meals. Choose foods such as meat, fish, milk and other low fat dairy products, vegetables, fruits, and whole-grain breads and cereals. Your caregiver will tell you of the ideal weight gain.  A physical sexual relationship may be continued throughout pregnancy if there are no other problems such as early (premature) leaking of amniotic fluid from the membranes, vaginal bleeding, or belly (abdominal) pain.  Exercise regularly if there are no restrictions. Check with your caregiver if you are unsure of the safety of your exercises. Greater weight gain will occur in the last 2 trimesters of pregnancy. Exercising helps:  Control your weight.  Get you in shape for labor and delivery.  You lose weight after you deliver.  Rest a lot with legs elevated, or as needed for leg cramps or low back pain.  Wear a good support or jogging bra for breast tenderness during pregnancy. This may help if worn during  sleep. Pads or tissues may be used in the bra if you are leaking colostrum.  Do not use hot tubs, steam rooms, or saunas.  Wear your seat belt when driving. This protects you and your baby if you are in an accident.  Avoid raw meat, cat litter boxes and soil used by cats. These carry germs that can cause birth defects in the baby.  It is easier to leak urine during pregnancy. Tightening up and strengthening the pelvic muscles will help with this problem. You can practice stopping your urination while you are going to the bathroom. These are the same muscles you need to strengthen. It is also the muscles you would use if you were trying to stop from passing gas. You can practice tightening these muscles up 10 times a set and repeating this about 3 times per day. Once you know what muscles to tighten up, do not perform these exercises during urination. It is more likely to cause an infection by backing up the urine.  Ask for help if you have financial, counseling, or nutritional needs during pregnancy. Your caregiver will be able to offer counseling for these   needs as well as refer you for other special needs.  Make a list of emergency phone numbers and have them available.  Plan on getting help from family or friends when you go home from the hospital.  Make a trial run to the hospital.  Take prenatal classes with the father to understand, practice, and ask questions about the labor and delivery.  Prepare the baby's room or nursery.  Do not travel out of the city unless it is absolutely necessary and with the advice of your caregiver.  Wear only low or no heal shoes to have better balance and prevent falling. MEDICINES AND DRUG USE IN PREGNANCY  Take prenatal vitamins as directed. The vitamin should contain 1 milligram of folic acid. Keep all vitamins out of reach of children. Only a couple vitamins or tablets containing iron may be fatal to a baby or young child when ingested.  Avoid use  of all medicines, including herbs, over-the-counter medicines, not prescribed or suggested by your caregiver. Only take over-the-counter or prescription medicines for pain, discomfort, or fever as directed by your caregiver. Do not use aspirin, ibuprofen or naproxen unless approved by your caregiver.  Let your caregiver also know about herbs you may be using.  Alcohol is related to a number of birth defects. This includes fetal alcohol syndrome. All alcohol, in any form, should be avoided completely. Smoking will cause low birth rate and premature babies.  Illegal drugs are very harmful to the baby. They are absolutely forbidden. A baby born to an addicted mother will be addicted at birth. The baby will go through the same withdrawal an adult does. SEEK MEDICAL CARE IF: You have any concerns or worries during your pregnancy. It is better to call with your questions if you feel they cannot wait, rather than worry about them. SEEK IMMEDIATE MEDICAL CARE IF:   An unexplained oral temperature above 102 F (38.9 C) develops, or as your caregiver suggests.  You have leaking of fluid from the vagina. If leaking membranes are suspected, take your temperature and tell your caregiver of this when you call.  There is vaginal spotting, bleeding or passing clots. Tell your caregiver of the amount and how many pads are used.  You develop a bad smelling vaginal discharge with a change in the color from clear to white.  You develop vomiting that lasts more than 24 hours.  You develop chills or fever.  You develop shortness of breath.  You develop burning on urination.  You loose more than 2 pounds of weight or gain more than 2 pounds of weight or as suggested by your caregiver.  You notice sudden swelling of your face, hands, and feet or legs.  You develop belly (abdominal) pain. Round ligament discomfort is a common non-cancerous (benign) cause of abdominal pain in pregnancy. Your caregiver still  must evaluate you.  You develop a severe headache that does not go away.  You develop visual problems, blurred or double vision.  If you have not felt your baby move for more than 1 hour. If you think the baby is not moving as much as usual, eat something with sugar in it and lie down on your left side for an hour. The baby should move at least 4 to 5 times per hour. Call right away if your baby moves less than that.  You fall, are in a car accident, or any kind of trauma.  There is mental or physical violence at home. Document Released: 01/11/2001   Document Revised: 10/12/2011 Document Reviewed: 07/16/2008 [employer] Patient Information 2014 [employer].  Breastfeeding A change in hormones during your pregnancy causes growth of your breast tissue and an increase in number and size of milk ducts. The hormone prolactin allows proteins, sugars, and fats from your blood supply to make breast milk in your milk-producing glands. The hormone progesterone prevents breast milk from being released before the birth of your baby. After the birth of your baby, your progesterone level decreases allowing breast milk to be released. Thoughts of your baby, as well as his or her sucking or crying, can stimulate the release of milk from the milk-producing glands. Deciding to breastfeed (nurse) is one of the best choices you can make for you and your baby. The information that follows gives a brief review of the benefits, as well as other important skills to know about breastfeeding. BENEFITS OF BREASTFEEDING For your baby  The first milk (colostrum) helps your baby's digestive system function better.   There are antibodies in your milk that help your baby fight off infections.   Your baby has a lower incidence of asthma, allergies, and sudden infant death syndrome (SIDS).   The nutrients in breast milk are better for your baby than infant formulas.  Breast milk improves your baby's brain development.    Your baby will have less gas, colic, and constipation.  Your baby is less likely to develop other conditions, such as childhood obesity, asthma, or diabetes mellitus. For you  Breastfeeding helps develop a very special bond between you and your baby.   Breastfeeding is convenient, always available at the correct temperature, and costs nothing.   Breastfeeding helps to burn calories and helps you lose the weight gained during pregnancy.   Breastfeeding makes your uterus contract back down to normal size faster and slows bleeding following delivery.   Breastfeeding mothers have a lower risk of developing osteoporosis or breast or ovarian cancer later in life.  BREASTFEEDING FREQUENCY  A healthy, full-term baby may breastfeed as often as every hour or space his or her feedings to every 3 hours. Breastfeeding frequency will vary from baby to baby.   Newborns should be fed no less than every 2 3 hours during the day and every 4 5 hours during the night. You should breastfeed a minimum of 8 feedings in a 24 hour period.  Awaken your baby to breastfeed if it has been 3 4 hours since the last feeding.  Breastfeed when you feel the need to reduce the fullness of your breasts or when your newborn shows signs of hunger. Signs that your baby may be hungry include:  Increased alertness or activity.  Stretching.  Movement of the head from side to side.  Movement of the head and opening of the mouth when the corner of the mouth or cheek is stroked (rooting).  Increased sucking sounds, smacking lips, cooing, sighing, or squeaking.  Hand-to-mouth movements.  Increased sucking of fingers or hands.  Fussing.  Intermittent crying.  Signs of extreme hunger will require calming and consoling before you try to feed your baby. Signs of extreme hunger may include:  Restlessness.  A loud, strong cry.  Screaming.  Frequent feeding will help you make more milk and will help prevent  problems, such as sore nipples and engorgement of the breasts.  BREASTFEEDING   Whether lying down or sitting, be sure that the baby's abdomen is facing your abdomen.   Support your breast with 4 fingers under your breast   and your thumb above your nipple. Make sure your fingers are well away from your nipple and your baby's mouth.   Stroke your baby's lips gently with your finger or nipple.   When your baby's mouth is open wide enough, place all of your nipple and as much of the colored area around your nipple (areola) as possible into your baby's mouth.  More areola should be visible above his or her upper lip than below his or her lower lip.  Your baby's tongue should be between his or her lower gum and your breast.  Ensure that your baby's mouth is correctly positioned around the nipple (latched). Your baby's lips should create a seal on your breast.  Signs that your baby has effectively latched onto your nipple include:  Tugging or sucking without pain.  Swallowing heard between sucks.  Absent click or smacking sound.  Muscle movement above and in front of his or her ears with sucking.  Your baby must suck about 2 3 minutes in order to get your milk. Allow your baby to feed on each breast as long as he or she wants. Nurse your baby until he or she unlatches or falls asleep at the first breast, then offer the second breast.  Signs that your baby is full and satisfied include:  A gradual decrease in the number of sucks or complete cessation of sucking.  Falling asleep.  Extension or relaxation of his or her body.  Retention of a small amount of milk in his or her mouth.  Letting go of your breast by himself or herself.  Signs of effective breastfeeding in you include:  Breasts that have increased firmness, weight, and size prior to feeding.  Breasts that are softer after nursing.  Increased milk volume, as well as a change in milk consistency and color by the 5th  day of breastfeeding.  Breast fullness relieved by breastfeeding.  Nipples are not sore, cracked, or bleeding.  If needed, break the suction by putting your finger into the corner of your baby's mouth and sliding your finger between his or her gums. Then, remove your breast from his or her mouth.  It is common for babies to spit up a small amount after a feeding.  Babies often swallow air during feeding. This can make babies fussy. Burping your baby between breasts can help with this.  Vitamin D supplements are recommended for babies who get only breast milk.  Avoid using a pacifier during your baby's first 4 6 weeks.  Avoid supplemental feedings of water, formula, or juice in place of breastfeeding. Breast milk is all the food your baby needs. It is not necessary for your baby to have water or formula. Your breasts will make more milk if supplemental feedings are avoided during the early weeks. HOW TO TELL WHETHER YOUR BABY IS GETTING ENOUGH BREAST MILK Wondering whether or not your baby is getting enough milk is a common concern among mothers. You can be assured that your baby is getting enough milk if:   Your baby is actively sucking and you hear swallowing.   Your baby seems relaxed and satisfied after a feeding.   Your baby nurses at least 8 12 times in a 24 hour time period.  During the first 3 5 days of age:  Your baby is wetting at least 3 5 diapers in a 24 hour period. The urine should be clear and pale yellow.  Your baby is having at least 3 4 stools in   a 24 hour period. The stool should be soft and yellow.  At 5 7 days of age, your baby is having at least 3 6 stools in a 24 hour period. The stool should be seedy and yellow by 5 days of age.  Your baby has a weight loss less than 7 10% during the first 3 days of age.  Your baby does not lose weight after 3 7 days of age.  Your baby gains 4 7 ounces each week after he or she is 4 days of age.  Your baby gains weight  by 5 days of age and is back to birth weight within 2 weeks. ENGORGEMENT In the first week after your baby is born, you may experience extremely full breasts (engorgement). When engorged, your breasts may feel heavy, warm, or tender to the touch. Engorgement peaks within 24 48 hours after delivery of your baby.  Engorgement may be reduced by:  Continuing to breastfeed.  Increasing the frequency of breastfeeding.  Taking warm showers or applying warm, moist heat to your breasts just before each feeding. This increases circulation and helps the milk flow.   Gently massaging your breast before and during the feedings. With your fingertips, massage from your chest wall towards your nipple in a circular motion.   Ensuring that your baby empties at least one breast at every feeding. It also helps to start the next feeding on the opposite breast.   Expressing breast milk by hand or by using a breast pump to empty the breasts if your baby is sleepy, or not nursing well. You may also want to express milk if you are returning to work oryou feel you are getting engorged.  Ensuring your baby is latched on and positioned properly while breastfeeding. If you follow these suggestions, your engorgement should improve in 24 48 hours. If you are still experiencing difficulty, call your lactation consultant or caregiver.  CARING FOR YOURSELF Take care of your breasts.  Bathe or shower daily.   Avoid using soap on your nipples.   Wear a supportive bra. Avoid wearing underwire style bras.  Air dry your nipples for a 3 4minutes after each feeding.   Use only cotton bra pads to absorb breast milk leakage. Leaking of breast milk between feedings is normal.   Use only pure lanolin on your nipples after nursing. You do not need to wash it off before feeding your baby again. Another option is to express a few drops of breast milk and gently massage that milk into your nipples.  Continue breast  self-awareness checks. Take care of yourself.  Eat healthy foods. Alternate 3 meals with 3 snacks.  Avoid foods that you notice affect your baby in a bad way.  Drink milk, fruit juice, and water to satisfy your thirst (about 8 glasses a day).   Rest often, relax, and take your prenatal vitamins to prevent fatigue, stress, and anemia.  Avoid chewing and smoking tobacco.  Avoid alcohol and drug use.  Take over-the-counter and prescribed medicine only as directed by your caregiver or pharmacist. You should always check with your caregiver or pharmacist before taking any new medicine, vitamin, or herbal supplement.  Know that pregnancy is possible while breastfeeding. If desired, talk to your caregiver about family planning and safe birth control methods that may be used while breastfeeding. SEEK MEDICAL CARE IF:   You feel like you want to stop breastfeeding or have become frustrated with breastfeeding.  You have painful breasts or nipples.    Your nipples are cracked or bleeding.  Your breasts are red, tender, or warm.  You have a swollen area on either breast.  You have a fever or chills.  You have nausea or vomiting.  You have drainage from your nipples.  Your breasts do not become full before feedings by the 5th day after delivery.  You feel sad and depressed.  Your baby is too sleepy to eat well.  Your baby is having trouble sleeping.   Your baby is wetting less than 3 diapers in a 24 hour period.  Your baby has less than 3 stools in a 24 hour period.  Your baby's skin or the white part of his or her eyes becomes more yellow.   Your baby is not gaining weight by 5 days of age. MAKE SURE YOU:   Understand these instructions.  Will watch your condition.  Will get help right away if you are not doing well or get worse. Document Released: 01/17/2005 Document Revised: 10/12/2011 Document Reviewed: 08/24/2011 [employer] Patient Information 2014 [employer].  

## 2012-06-28 NOTE — Progress Notes (Signed)
TDaP today 28 wk labs

## 2012-06-28 NOTE — Progress Notes (Signed)
Pulse: 90 28 week labs today

## 2012-06-28 NOTE — Assessment & Plan Note (Signed)
Doing well 

## 2012-06-29 LAB — GLUCOSE TOLERANCE, 1 HOUR (50G) W/O FASTING: Glucose, 1 Hour GTT: 128 mg/dL (ref 70–140)

## 2012-07-12 ENCOUNTER — Ambulatory Visit (INDEPENDENT_AMBULATORY_CARE_PROVIDER_SITE_OTHER): Payer: Medicaid Other | Admitting: Obstetrics & Gynecology

## 2012-07-12 ENCOUNTER — Encounter: Payer: Self-pay | Admitting: Obstetrics & Gynecology

## 2012-07-12 VITALS — BP 117/82 | Temp 98.4°F | Wt 276.2 lb

## 2012-07-12 DIAGNOSIS — O210 Mild hyperemesis gravidarum: Secondary | ICD-10-CM

## 2012-07-12 DIAGNOSIS — R110 Nausea: Secondary | ICD-10-CM

## 2012-07-12 LAB — POCT URINALYSIS DIP (DEVICE)
Bilirubin Urine: NEGATIVE
Hgb urine dipstick: NEGATIVE
Nitrite: NEGATIVE
Specific Gravity, Urine: 1.02 (ref 1.005–1.030)
pH: 7 (ref 5.0–8.0)

## 2012-07-12 MED ORDER — POLYETHYLENE GLYCOL 3350 17 GM/SCOOP PO POWD: 17.0000 g | Freq: Every day | ORAL | Status: DC

## 2012-07-12 MED ORDER — PANTOPRAZOLE SODIUM 40 MG PO TBEC: 40.0000 mg | DELAYED_RELEASE_TABLET | Freq: Every day | ORAL | Status: DC

## 2012-07-12 NOTE — Progress Notes (Signed)
Pulse- 92 Patient reports lower abdominal/pelvic pressure

## 2012-07-12 NOTE — Progress Notes (Signed)
No problems except reflux, will Rx Protonix

## 2012-07-12 NOTE — Patient Instructions (Signed)

## 2012-07-19 ENCOUNTER — Encounter: Payer: Self-pay | Admitting: *Deleted

## 2012-07-26 ENCOUNTER — Ambulatory Visit (INDEPENDENT_AMBULATORY_CARE_PROVIDER_SITE_OTHER): Payer: Medicaid Other | Admitting: Obstetrics and Gynecology

## 2012-07-26 ENCOUNTER — Encounter: Payer: Self-pay | Admitting: Obstetrics and Gynecology

## 2012-07-26 VITALS — BP 121/76 | Wt 278.2 lb

## 2012-07-26 DIAGNOSIS — J45909 Unspecified asthma, uncomplicated: Secondary | ICD-10-CM

## 2012-07-26 DIAGNOSIS — O99213 Obesity complicating pregnancy, third trimester: Secondary | ICD-10-CM

## 2012-07-26 DIAGNOSIS — O99891 Other specified diseases and conditions complicating pregnancy: Secondary | ICD-10-CM

## 2012-07-26 DIAGNOSIS — O26899 Other specified pregnancy related conditions, unspecified trimester: Secondary | ICD-10-CM

## 2012-07-26 DIAGNOSIS — O210 Mild hyperemesis gravidarum: Secondary | ICD-10-CM

## 2012-07-26 DIAGNOSIS — E669 Obesity, unspecified: Secondary | ICD-10-CM

## 2012-07-26 LAB — POCT URINALYSIS DIP (DEVICE)
Hgb urine dipstick: NEGATIVE
Ketones, ur: NEGATIVE mg/dL
Protein, ur: NEGATIVE mg/dL
pH: 7 (ref 5.0–8.0)

## 2012-07-26 NOTE — Progress Notes (Signed)
Pulse: 88

## 2012-07-26 NOTE — Progress Notes (Signed)
Patient doing well without complaints. FM/PTL precautions reviewed. Patient reports improvement in acid reflux and is not using Protonix on a daily basis.

## 2012-08-02 ENCOUNTER — Encounter (HOSPITAL_COMMUNITY): Payer: Self-pay | Admitting: *Deleted

## 2012-08-02 ENCOUNTER — Inpatient Hospital Stay (HOSPITAL_COMMUNITY)
Admission: AD | Admit: 2012-08-02 | Discharge: 2012-08-02 | Disposition: A | Discharge status: Home / Self Care | Payer: Medicaid Other | Source: Ambulatory Visit | Attending: Obstetrics & Gynecology | Admitting: Obstetrics & Gynecology

## 2012-08-02 DIAGNOSIS — W19XXXA Unspecified fall, initial encounter: Secondary | ICD-10-CM

## 2012-08-02 DIAGNOSIS — O99519 Diseases of the respiratory system complicating pregnancy, unspecified trimester: Secondary | ICD-10-CM

## 2012-08-02 DIAGNOSIS — J45909 Unspecified asthma, uncomplicated: Secondary | ICD-10-CM

## 2012-08-02 DIAGNOSIS — W010XXA Fall on same level from slipping, tripping and stumbling without subsequent striking against object, initial encounter: Secondary | ICD-10-CM | POA: Insufficient documentation

## 2012-08-02 DIAGNOSIS — O99891 Other specified diseases and conditions complicating pregnancy: Secondary | ICD-10-CM | POA: Insufficient documentation

## 2012-08-02 DIAGNOSIS — Y92009 Unspecified place in unspecified non-institutional (private) residence as the place of occurrence of the external cause: Secondary | ICD-10-CM | POA: Insufficient documentation

## 2012-08-02 LAB — URINALYSIS, ROUTINE W REFLEX MICROSCOPIC
Bilirubin Urine: NEGATIVE
Glucose, UA: NEGATIVE mg/dL
Hgb urine dipstick: NEGATIVE
Ketones, ur: NEGATIVE mg/dL
Nitrite: NEGATIVE
pH: 8 (ref 5.0–8.0)

## 2012-08-02 NOTE — MAU Provider Note (Signed)
History     CSN: [id number]  Arrival date and time: 08/02/12 1449   None     Chief Complaint  Patient presents with  . Fall  . Back Pain   HPI 27yo [id number] at [redacted]w[redacted]d presents after a fall. She tripped over a cord and fell forward, but broke her fall with both hands and does not think she landed on her abdomen at all. She has no abdominal pain and does not feel contractions. No loss of fluid and no bloody discharge. Since the fall she has had some minor discomfort in her lower back that has not gotten worse, but it does not really cause her much concern. She has continued to feel fetal movements.   Past Medical History  Diagnosis Date  . Asthma   . PCOS (polycystic ovarian syndrome)     Past Surgical History  Procedure Laterality Date  . Mouth surgery    . Lung surgery      Family History  Problem Relation Age of Onset  . Diabetes Mother   . Cancer Maternal Aunt   . Hypertension Maternal Grandmother   . Hypertension Maternal Grandfather   . Cancer Maternal Grandfather   . Hypertension Paternal Grandmother   . Hypertension Paternal Grandfather     History  Substance Use Topics  . Smoking status: Never Smoker   . Smokeless tobacco: Never Used  . Alcohol Use: No    Allergies:  Allergies  Allergen Reactions  . Cefaclor Other (See Comments)    "ceclor"= childhood allergy  . Sulfonamide Derivatives Other (See Comments)    Sulfa= childhood allergy    Prescriptions prior to admission  Medication Sig Dispense Refill  . pantoprazole (PROTONIX) 40 MG tablet Take 1 tablet (40 mg total) by mouth daily.  30 tablet  3  . polyethylene glycol powder (MIRALAX) powder Take 17 g by mouth daily.  255 g  2  . Prenatal Vit-Fe Fumarate-FA (PRENATAL MULTIVITAMIN) TABS Take 1 tablet by mouth at bedtime.       Marland Kitchen albuterol (PROVENTIL HFA;VENTOLIN HFA) 108 (90 BASE) MCG/ACT inhaler Inhale 2 puffs into the lungs every 6 (six) hours as needed for wheezing. For wheezing  18 g  2    ROS  negative except as mentioned above.  Physical Exam   Blood pressure 119/67, pulse 91, temperature 98.5 F (36.9 C), resp. rate 18, height 5' 4.5" (1.638 m), weight 127.37 kg (280 lb 12.8 oz), last menstrual period 10/04/2011, SpO2 100.00%.  Physical Exam General appearance: alert and no distress Head: Normocephalic, without obvious abnormality, atraumatic Back: no tenderness to palpation Lungs: clear to auscultation bilaterally Heart: regular rate and rhythm and S1, S2 normal Abdomen: gravid, non-tender to palpation Extremities: no edema, redness or tenderness in the calves or thighs  EFM: 130bpm, mod var, accels present, no decels  MAU Course  Procedures Results for orders placed during the hospital encounter of 08/02/12 (from the past 24 hour(s))  URINALYSIS, ROUTINE W REFLEX MICROSCOPIC     Status: None   Collection Time    08/02/12  3:45 PM      Result Value Range   Color, Urine YELLOW  YELLOW   APPearance CLEAR  CLEAR   Specific Gravity, Urine 1.020  1.005 - 1.030   pH 8.0  5.0 - 8.0   Glucose, UA NEGATIVE  NEGATIVE mg/dL   Hgb urine dipstick NEGATIVE  NEGATIVE   Bilirubin Urine NEGATIVE  NEGATIVE   Ketones, ur NEGATIVE  NEGATIVE mg/dL   Protein,  ur NEGATIVE  NEGATIVE mg/dL   Urobilinogen, UA 0.2  0.0 - 1.0 mg/dL   Nitrite NEGATIVE  NEGATIVE   Leukocytes, UA NEGATIVE  NEGATIVE   MDM Fetal monitoring reassuring, will monitor for at least 4 hours and then re-evaluate.  Assessment and Plan  Not in labor, fetal monitoring stable Stable for discharge  Tawni Carnes 08/02/2012, 4:40 PM   Evaluation and management procedures were performed by Resident physician under my supervision/collaboration. Chart reviewed, patient examined by me and I agree with management and plan. D/W Dr. Despina Hidden. EFM 3 hrs. No UCs noted.  Danae Orleans, CNM 08/03/2012 12:00 PM

## 2012-08-02 NOTE — MAU Note (Signed)
Patient states she tripped and fell forward and caught herself with her hands, not feel like she hit her abdomen. Happened about 2 hours ago. Reports good fetal movement, no bleeding or leaking. Having some mild tightening and low back pain.

## 2012-08-09 ENCOUNTER — Ambulatory Visit (INDEPENDENT_AMBULATORY_CARE_PROVIDER_SITE_OTHER): Payer: Medicaid Other | Admitting: Obstetrics & Gynecology

## 2012-08-09 VITALS — BP 124/89 | Temp 98.0°F | Wt 278.4 lb

## 2012-08-09 DIAGNOSIS — J45909 Unspecified asthma, uncomplicated: Secondary | ICD-10-CM

## 2012-08-09 LAB — POCT URINALYSIS DIP (DEVICE)
Bilirubin Urine: NEGATIVE
Ketones, ur: NEGATIVE mg/dL
Protein, ur: 30 mg/dL — AB
Specific Gravity, Urine: 1.02 (ref 1.005–1.030)

## 2012-08-09 NOTE — Progress Notes (Signed)
P=92, c./ o worsening pressure in lower back/pelvic. C/o discomfort in vaginal area- denies itching, discharge

## 2012-08-09 NOTE — Patient Instructions (Signed)

## 2012-08-09 NOTE — Progress Notes (Signed)
No nausea or heartburn. Wet prep today.

## 2012-08-10 LAB — WET PREP, GENITAL
Clue Cells Wet Prep HPF POC: NONE SEEN
Trich, Wet Prep: NONE SEEN
Yeast Wet Prep HPF POC: NONE SEEN

## 2012-08-10 LAB — OB RESULTS CONSOLE GC/CHLAMYDIA: Chlamydia: NEGATIVE

## 2012-08-10 LAB — GC/CHLAMYDIA PROBE AMP: CT Probe RNA: NEGATIVE

## 2012-08-23 ENCOUNTER — Ambulatory Visit (INDEPENDENT_AMBULATORY_CARE_PROVIDER_SITE_OTHER): Payer: Medicaid Other | Admitting: Obstetrics and Gynecology

## 2012-08-23 VITALS — BP 125/71 | Temp 99.0°F | Wt 284.0 lb

## 2012-08-23 DIAGNOSIS — O99210 Obesity complicating pregnancy, unspecified trimester: Secondary | ICD-10-CM

## 2012-08-23 DIAGNOSIS — R82998 Other abnormal findings in urine: Secondary | ICD-10-CM

## 2012-08-23 DIAGNOSIS — R8271 Bacteriuria: Secondary | ICD-10-CM

## 2012-08-23 LAB — POCT URINALYSIS DIP (DEVICE)
Bilirubin Urine: NEGATIVE
Hgb urine dipstick: NEGATIVE
Nitrite: NEGATIVE
Protein, ur: NEGATIVE mg/dL
Urobilinogen, UA: 0.2 mg/dL (ref 0.0–1.0)
pH: 6 (ref 5.0–8.0)

## 2012-08-23 NOTE — Patient Instructions (Signed)
Contraception Choices  Contraception (birth control) is the use of any methods or devices to prevent pregnancy. Below are some methods to help avoid pregnancy.  HORMONAL METHODS   · Contraceptive implant. This is a thin, plastic tube containing progesterone hormone. It does not contain estrogen hormone. Your caregiver inserts the tube in the inner part of the upper arm. The tube can remain in place for up to 3 years. After 3 years, the implant must be removed. The implant prevents the ovaries from releasing an egg (ovulation), thickens the cervical mucus which prevents sperm from entering the uterus, and thins the lining of the inside of the uterus.  · Progesterone-only injections. These injections are given every 3 months by your caregiver to prevent pregnancy. This synthetic progesterone hormone stops the ovaries from releasing eggs. It also thickens cervical mucus and changes the uterine lining. This makes it harder for sperm to survive in the uterus.  · Birth control pills. These pills contain estrogen and progesterone hormone. They work by stopping the egg from forming in the ovary (ovulation). Birth control pills are prescribed by a caregiver. Birth control pills can also be used to treat heavy periods.  · Minipill. This type of birth control pill contains only the progesterone hormone. They are taken every day of each month and must be prescribed by your caregiver.  · Birth control patch. The patch contains hormones similar to those in birth control pills. It must be changed once a week and is prescribed by a caregiver.  · Vaginal ring. The ring contains hormones similar to those in birth control pills. It is left in the vagina for 3 weeks, removed for 1 week, and then a new one is put back in place. The patient must be comfortable inserting and removing the ring from the vagina. A caregiver's prescription is necessary.  · Emergency contraception. Emergency contraceptives prevent pregnancy after unprotected  sexual intercourse. This pill can be taken right after sex or up to 5 days after unprotected sex. It is most effective the sooner you take the pills after having sexual intercourse. Emergency contraceptive pills are available without a prescription. Check with your pharmacist. Do not use emergency contraception as your only form of birth control.  BARRIER METHODS   · Female condom. This is a thin sheath (latex or rubber) that is worn over the penis during sexual intercourse. It can be used with spermicide to increase effectiveness.  · Female condom. This is a soft, loose-fitting sheath that is put into the vagina before sexual intercourse.  · Diaphragm. This is a soft, latex, dome-shaped barrier that must be fitted by a caregiver. It is inserted into the vagina, along with a spermicidal jelly. It is inserted before intercourse. The diaphragm should be left in the vagina for 6 to 8 hours after intercourse.  · Cervical cap. This is a round, soft, latex or plastic cup that fits over the cervix and must be fitted by a caregiver. The cap can be left in place for up to 48 hours after intercourse.  · Sponge. This is a soft, circular piece of polyurethane foam. The sponge has spermicide in it. It is inserted into the vagina after wetting it and before sexual intercourse.  · Spermicides. These are chemicals that kill or block sperm from entering the cervix and uterus. They come in the form of creams, jellies, suppositories, foam, or tablets. They do not require a prescription. They are inserted into the vagina with an applicator before having sexual intercourse.   The process must be repeated every time you have sexual intercourse.  INTRAUTERINE CONTRACEPTION  · Intrauterine device (IUD). This is a T-shaped device that is put in a woman's uterus during a menstrual period to prevent pregnancy. There are 2 types:  · Copper IUD. This type of IUD is wrapped in copper wire and is placed inside the uterus. Copper makes the uterus and  fallopian tubes produce a fluid that kills sperm. It can stay in place for 10 years.  · Hormone IUD. This type of IUD contains the hormone progestin (synthetic progesterone). The hormone thickens the cervical mucus and prevents sperm from entering the uterus, and it also thins the uterine lining to prevent implantation of a fertilized egg. The hormone can weaken or kill the sperm that get into the uterus. It can stay in place for 5 years.  PERMANENT METHODS OF CONTRACEPTION  · Female tubal ligation. This is when the woman's fallopian tubes are surgically sealed, tied, or blocked to prevent the egg from traveling to the uterus.  · Female sterilization. This is when the female has the tubes that carry sperm tied off (vasectomy). This blocks sperm from entering the vagina during sexual intercourse. After the procedure, the man can still ejaculate fluid (semen).  NATURAL PLANNING METHODS  · Natural family planning. This is not having sexual intercourse or using a barrier method (condom, diaphragm, cervical cap) on days the woman could become pregnant.  · Calendar method. This is keeping track of the length of each menstrual cycle and identifying when you are fertile.  · Ovulation method. This is avoiding sexual intercourse during ovulation.  · Symptothermal method. This is avoiding sexual intercourse during ovulation, using a thermometer and ovulation symptoms.  · Post-ovulation method. This is timing sexual intercourse after you have ovulated.  Regardless of which type or method of contraception you choose, it is important that you use condoms to protect against the transmission of sexually transmitted diseases (STDs). Talk with your caregiver about which form of contraception is most appropriate for you.  Document Released: 01/17/2005 Document Revised: 04/11/2011 Document Reviewed: 05/26/2010  [employer]® Patient Information ©2014 [employer].

## 2012-08-23 NOTE — Progress Notes (Signed)
Doing well. RLP and contraception discussed. Mod LE and frequency, no dysuria> C&S.

## 2012-08-23 NOTE — Progress Notes (Signed)
Pulse 99 C/o pressure in lower back and pelvic.

## 2012-08-23 NOTE — Addendum Note (Signed)
Addended by: Franchot Mimes on: 08/23/2012 09:58 AM   Modules accepted: Orders

## 2012-08-25 LAB — CULTURE, OB URINE

## 2012-09-06 ENCOUNTER — Encounter: Payer: Self-pay | Admitting: Obstetrics and Gynecology

## 2012-09-07 ENCOUNTER — Telehealth: Payer: Self-pay | Admitting: General Practice

## 2012-09-07 NOTE — Telephone Encounter (Signed)
Patient called and left a message stating that she has been having problems trying to get an appt scheduled here and was recently told it would be 2 weeks before we could see her and she hasn't been seen since 6/26 and is 37w pregnant and would like a call back. Called patient back stating I received her message about needed an appt and got her put on the schedule for Monday the 11th at 8. Patient verbalized understanding and had no further questions

## 2012-09-10 ENCOUNTER — Encounter: Payer: Self-pay | Admitting: Obstetrics and Gynecology

## 2012-09-10 ENCOUNTER — Ambulatory Visit (INDEPENDENT_AMBULATORY_CARE_PROVIDER_SITE_OTHER): Payer: Medicaid Other | Admitting: Obstetrics and Gynecology

## 2012-09-10 VITALS — BP 117/81 | Temp 97.4°F | Wt 291.5 lb

## 2012-09-10 DIAGNOSIS — O99210 Obesity complicating pregnancy, unspecified trimester: Secondary | ICD-10-CM

## 2012-09-10 DIAGNOSIS — J4520 Mild intermittent asthma, uncomplicated: Secondary | ICD-10-CM

## 2012-09-10 DIAGNOSIS — J45909 Unspecified asthma, uncomplicated: Secondary | ICD-10-CM

## 2012-09-10 DIAGNOSIS — Z3493 Encounter for supervision of normal pregnancy, unspecified, third trimester: Secondary | ICD-10-CM

## 2012-09-10 DIAGNOSIS — O99213 Obesity complicating pregnancy, third trimester: Secondary | ICD-10-CM

## 2012-09-10 LAB — POCT URINALYSIS DIP (DEVICE)
Bilirubin Urine: NEGATIVE
Glucose, UA: NEGATIVE mg/dL
Ketones, ur: NEGATIVE mg/dL
Nitrite: NEGATIVE
Specific Gravity, Urine: >=1.03 (ref 1.005–1.030)
pH: 6 (ref 5.0–8.0)

## 2012-09-10 NOTE — Progress Notes (Signed)
Patient doing well without complaints. Labor precautions/FM reviewed

## 2012-09-10 NOTE — Progress Notes (Signed)
Pulse- 87 Patient reports lower abdominal pressure & back pain

## 2012-09-11 ENCOUNTER — Inpatient Hospital Stay (HOSPITAL_COMMUNITY)
Admission: AD | Admit: 2012-09-11 | Discharge: 2012-09-11 | Disposition: A | Discharge status: Home / Self Care | Payer: Medicaid Other | Source: Ambulatory Visit | Attending: Obstetrics and Gynecology | Admitting: Obstetrics and Gynecology

## 2012-09-11 DIAGNOSIS — M545 Low back pain, unspecified: Secondary | ICD-10-CM | POA: Insufficient documentation

## 2012-09-11 DIAGNOSIS — O99519 Diseases of the respiratory system complicating pregnancy, unspecified trimester: Secondary | ICD-10-CM

## 2012-09-11 DIAGNOSIS — J45909 Unspecified asthma, uncomplicated: Secondary | ICD-10-CM

## 2012-09-11 DIAGNOSIS — O479 False labor, unspecified: Secondary | ICD-10-CM | POA: Insufficient documentation

## 2012-09-11 NOTE — H&P (Addendum)
Kaitlyn Walton is a 27 y.o. female [phone] at 44.0 EGA presenting for labor evaluation from suspect ROM beginning yesterday.  Describes thin, clear and watery discharge since 8am yesterday after cervix check at prenatal appointment.  Denies ctx, but significant low back pain, 7/10 since 9-10pm last night.  Reports lots of lower pelvic pressure for a couple days. Maternal Medical History:  Reason for admission: Rupture of membranes and nausea.   Fetal activity: Perceived fetal activity is normal.   Last perceived fetal movement was within the past hour.    Prenatal complications: no prenatal complications Prenatal Complications - Diabetes: none.    OB History   Grav Para Term Preterm Abortions TAB SAB Ect Mult Living   4 2 2  0 1 0 1 0 0 2     Past Medical History  Diagnosis Date  . Asthma   . PCOS (polycystic ovarian syndrome)    Past Surgical History  Procedure Laterality Date  . Mouth surgery    . Lung surgery     Family History: family history includes Cancer in her maternal aunt and maternal grandfather; Diabetes in her mother; and Hypertension in her maternal grandfather, maternal grandmother, paternal grandfather, and paternal grandmother. Social History:  reports that she has never smoked. She has never used smokeless tobacco. She reports that she does not drink alcohol or use illicit drugs.   Prenatal Transfer Tool  Maternal Diabetes: No Genetic Screening: Normal Maternal Ultrasounds/Referrals: Normal Fetal Ultrasounds or other Referrals:  None Maternal Substance Abuse:  No Significant Maternal Medications:  Meds include: Other: Albuterol Significant Maternal Lab Results:  None Other Comments:  GBS Unknown, Plans to breastfeed and considering Mirena.  Wants Nubain for pain management, will consider epidural.  Review of Systems  Constitutional: Negative for fever and chills.  Eyes:       Denies vision changes.  Respiratory: Negative for shortness of breath.    Cardiovascular: Negative for chest pain, palpitations and leg swelling.  Gastrointestinal: Positive for nausea. Negative for vomiting, abdominal pain, diarrhea and constipation.  Musculoskeletal: Positive for back pain.       C/o low back pain at 7/10 pain since 9-10pm yesterday.  Neurological: Negative for headaches.      Blood pressure 123/64, pulse 87, temperature 98.5 F (36.9 C), temperature source Oral, resp. rate 18, height 5\' 3"  (1.6 m), weight 132.45 kg (292 lb), last menstrual period 10/04/2011.  Maternal Exam:  Cervix: not evaluated.   Fetal Exam Fetal Monitor Review: Mode: hand-held doppler probe.   Baseline rate: 142.     SSE: visually unchanged. No LOF with coughing. NEg for ferning  Physical Exam  Constitutional: She is oriented to person, place, and time. She appears well-developed and well-nourished.  Obese  HENT:  Head: Normocephalic and atraumatic.  Eyes: EOM are normal. No scleral icterus.  Cardiovascular: Normal rate, regular rhythm and normal heart sounds.   Respiratory: Effort normal and breath sounds normal.  GI: Soft. There is no tenderness.  Gravid  Neurological: She is alert and oriented to person, place, and time.  Skin: Skin is warm and dry.  Psychiatric: She has a normal mood and affect. Her behavior is normal. Judgment and thought content normal.    Prenatal labs: ABO, Rh: O/POS/-- (01/20 1138) Antibody: NEG (01/20 1138) Rubella: 3.62 (01/20 1138) RPR: NON REAC (05/29 1147)  HBsAg: NEGATIVE (01/20 1138)  HIV: NON REACTIVE (01/20 1138)  GBS:   unknown  Assessment/Plan: 27 yo G4P2012 at 38.0 EGA here for evaluation  of ROM.  With negative ferning will discharge home with labor precuations.   Garnette Czech 09/11/2012, 2:48 PM  I spoke with and examined patient and agree with PA-S's note and plan of care.  D/C pt home with PTL precautions. Back pain stable and not contractions. Encourage hydration, stretching and tylenol  PRN.  Tawana Scale, MD Ob Fellow 09/11/2012 3:23 PM

## 2012-09-11 NOTE — MAU Note (Signed)
Is having back pain.  Constant, with periods of increased intensity.  Was at dr's yesterday, was having low abd and low back pain- instructed to come here if it got more intense.  Was 2 cm yesterday.

## 2012-09-16 ENCOUNTER — Encounter (HOSPITAL_COMMUNITY): Payer: Self-pay

## 2012-09-16 ENCOUNTER — Inpatient Hospital Stay (HOSPITAL_COMMUNITY)
Admission: AD | Admit: 2012-09-16 | Discharge: 2012-09-18 | DRG: 775 | Disposition: A | Discharge status: Home / Self Care | Payer: Medicaid Other | Source: Ambulatory Visit | Attending: Obstetrics and Gynecology | Admitting: Obstetrics and Gynecology

## 2012-09-16 DIAGNOSIS — Z22330 Carrier of Group B streptococcus: Secondary | ICD-10-CM

## 2012-09-16 DIAGNOSIS — J45909 Unspecified asthma, uncomplicated: Secondary | ICD-10-CM

## 2012-09-16 DIAGNOSIS — O99892 Other specified diseases and conditions complicating childbirth: Secondary | ICD-10-CM | POA: Diagnosis present

## 2012-09-16 LAB — CBC
MCHC: 33.5 g/dL (ref 30.0–36.0)
Platelets: 271 10*3/uL (ref 150–400)
RDW: 14.2 % (ref 11.5–15.5)
WBC: 16.6 10*3/uL — ABNORMAL HIGH (ref 4.0–10.5)

## 2012-09-16 LAB — POCT FERN TEST: POCT Fern Test: POSITIVE

## 2012-09-16 MED ORDER — FLEET ENEMA 7-19 GM/118ML RE ENEM: 1.0000 | ENEMA | RECTAL | Status: DC | PRN

## 2012-09-16 MED ORDER — OXYCODONE-ACETAMINOPHEN 5-325 MG PO TABS: 1.0000 | ORAL_TABLET | ORAL | Status: DC | PRN

## 2012-09-16 MED ORDER — LIDOCAINE HCL (PF) 1 % IJ SOLN
30.0000 mL | INTRAMUSCULAR | Status: DC | PRN
  Filled 2012-09-16 (×2): qty 30

## 2012-09-16 MED ORDER — LACTATED RINGERS IV SOLN
INTRAVENOUS | Status: DC
  Administered 2012-09-17: 04:00:00 via INTRAVENOUS

## 2012-09-16 MED ORDER — ZOLPIDEM TARTRATE 5 MG PO TABS: 5.0000 mg | ORAL_TABLET | Freq: Every evening | ORAL | Status: DC | PRN

## 2012-09-16 MED ORDER — PENICILLIN G POTASSIUM 5000000 UNITS IJ SOLR
5.0000 10*6.[IU] | Freq: Once | INTRAVENOUS | Status: AC
  Administered 2012-09-16: 5 10*6.[IU] via INTRAVENOUS
  Filled 2012-09-16: qty 5

## 2012-09-16 MED ORDER — OXYTOCIN 40 UNITS IN LACTATED RINGERS INFUSION - SIMPLE MED
1.0000 m[IU]/min | INTRAVENOUS | Status: DC
  Filled 2012-09-16: qty 1000

## 2012-09-16 MED ORDER — ONDANSETRON HCL 4 MG/2ML IJ SOLN: 4.0000 mg | Freq: Four times a day (QID) | INTRAMUSCULAR | Status: DC | PRN

## 2012-09-16 MED ORDER — IBUPROFEN 600 MG PO TABS: 600.0000 mg | ORAL_TABLET | Freq: Four times a day (QID) | ORAL | Status: DC | PRN

## 2012-09-16 MED ORDER — LACTATED RINGERS IV SOLN
500.0000 mL | INTRAVENOUS | Status: DC | PRN
Start: 2012-09-16 — End: 2012-09-17

## 2012-09-16 MED ORDER — PENICILLIN G POTASSIUM 5000000 UNITS IJ SOLR
2.5000 10*6.[IU] | INTRAMUSCULAR | Status: DC
  Administered 2012-09-17: 2.5 10*6.[IU] via INTRAVENOUS
  Filled 2012-09-16 (×4): qty 2.5

## 2012-09-16 MED ORDER — OXYTOCIN BOLUS FROM INFUSION
500.0000 mL | INTRAVENOUS | Status: DC
  Administered 2012-09-17: 500 mL via INTRAVENOUS

## 2012-09-16 MED ORDER — CITRIC ACID-SODIUM CITRATE 334-500 MG/5ML PO SOLN: 30.0000 mL | ORAL | Status: DC | PRN

## 2012-09-16 MED ORDER — ALBUTEROL SULFATE HFA 108 (90 BASE) MCG/ACT IN AERS
2.0000 | INHALATION_SPRAY | Freq: Four times a day (QID) | RESPIRATORY_TRACT | Status: DC | PRN
  Filled 2012-09-16: qty 6.7

## 2012-09-16 MED ORDER — ACETAMINOPHEN 325 MG PO TABS: 650.0000 mg | ORAL_TABLET | ORAL | Status: DC | PRN

## 2012-09-16 MED ORDER — OXYTOCIN 40 UNITS IN LACTATED RINGERS INFUSION - SIMPLE MED
62.5000 mL/h | INTRAVENOUS | Status: DC
  Administered 2012-09-17: 62.5 mL/h via INTRAVENOUS

## 2012-09-16 MED ORDER — TERBUTALINE SULFATE 1 MG/ML IJ SOLN: 0.2500 mg | Freq: Once | INTRAMUSCULAR | Status: AC | PRN

## 2012-09-16 MED ORDER — NALBUPHINE SYRINGE 5 MG/0.5 ML
5.0000 mg | INJECTION | INTRAMUSCULAR | Status: DC | PRN
  Administered 2012-09-17: 5 mg via INTRAVENOUS
  Filled 2012-09-16 (×2): qty 0.5

## 2012-09-16 NOTE — MAU Note (Signed)
Pt states leaking clear fluid x 1.5 hrs. Have several small gushes. Some painful contractions.

## 2012-09-16 NOTE — H&P (Signed)
Kaitlyn Walton is a 27 y.o. female [phone] with IUP at [redacted]w[redacted]d presenting for ROM/early labor. Pt states she has been having irregular, every 4-8 minutes contractions, associated with no vaginal bleeding.  Membranes are ruptured at 1900 with clear fluid, with active fetal movement.   [facility] at [facility] since 8 wks  Prenatal History/Complications:  Past Medical History: Past Medical History  Diagnosis Date  . Asthma   . PCOS (polycystic ovarian syndrome)     Past Surgical History: Past Surgical History  Procedure Laterality Date  . Mouth surgery    . Lung surgery      Obstetrical History: OB History   Grav Para Term Preterm Abortions TAB SAB Ect Mult Living   4 2 2  0 1 0 1 0 0 2        Social History: History   Social History  . Marital Status: Single    Spouse Name: N/A    Number of Children: N/A  . Years of Education: N/A   Social History Main Topics  . Smoking status: Never Smoker   . Smokeless tobacco: Never Used  . Alcohol Use: No  . Drug Use: No  . Sexual Activity: Yes   Other Topics Concern  . None   Social History Narrative  . None    Family History: Family History  Problem Relation Age of Onset  . Diabetes Mother   . Cancer Maternal Aunt   . Hypertension Maternal Grandmother   . Hypertension Maternal Grandfather   . Cancer Maternal Grandfather   . Hypertension Paternal Grandmother   . Hypertension Paternal Grandfather     Allergies: Allergies  Allergen Reactions  . Cefaclor Other (See Comments)    "ceclor"= childhood allergy  . Sulfonamide Derivatives Other (See Comments)    Sulfa= childhood allergy    Prescriptions prior to admission  Medication Sig Dispense Refill  . albuterol (PROVENTIL HFA;VENTOLIN HFA) 108 (90 BASE) MCG/ACT inhaler Inhale 2 puffs into the lungs every 6 (six) hours as needed for wheezing. For wheezing  18 g  2  . polyethylene glycol (MIRALAX / GLYCOLAX) packet Take 17 g by mouth daily.      . Prenatal Vit-Fe  Fumarate-FA (PRENATAL MULTIVITAMIN) TABS Take 1 tablet by mouth at bedtime.          Review of Systems   Constitutional: Negative for fever and chills Eyes: Negative for visual disturbances Respiratory: Negative for shortness of breath, dyspnea Cardiovascular: Negative for chest pain or palpitations  Gastrointestinal: Negative for vomiting, diarrhea and constipation Genitourinary: Negative for dysuria and urgency Musculoskeletal: Negative for back pain, joint pain, myalgias  Neurological: Negative for dizziness and headaches  Temperature 99.1 F (37.3 C), temperature source Oral, resp. rate 20, height 5\' 4"  (1.626 m), weight 134.446 kg (296 lb 6.4 oz), last menstrual period 10/04/2011. General appearance: alert, cooperative and no distress Lungs: clear to auscultation bilaterally Heart: regular rate and rhythm Abdomen: soft, non-tender; bowel sounds normal.  EFW difficult to assess d/t body habitus Pelvic: SSE:  Clear fluid pooling behind cx; fern + Extremities: Homans sign is negative, no sign of DVT DTR's 2+ Presentation: cephalic Fetal monitoringBaseline: 150 bpm, Variability: Good {> 6 bpm), Accelerations: Reactive and Decelerations: Absent Uterine activity  Started having contractions around the time her water broke.  Now q 2-4 minutes Dilation: 3 Effacement (%): 80 Station: -2 Exam by:: Rodena Piety Dishmon CNM   Prenatal labs: ABO, Rh: O/POS/-- (01/20 1138) Antibody: NEG (01/20 1138) Rubella:  immune RPR: NON REAC (  05/29 1147)  HBsAg: NEGATIVE (01/20 1138)  HIV: NON REACTIVE (01/20 1138)  GBS: Positive (08/12 0000)  1 hr Glucola 128 Genetic screening  normal Anatomy US normal   Assessment: Kaitlyn Walton is a 27 y.o. [phone] with an IUP at [redacted]w[redacted]d presenting for ROM, early labor  Plan: Start GBS prophylaxis Will augment with pitocin if does not start labor 4-8 hrs s/p first PCN infusion (pt requests hold off if possible because she wants to try labor  without an epidural)  CRESENZO-DISHMAN,Gloristine Turrubiates 09/16/2012, 10:48 PM

## 2012-09-17 ENCOUNTER — Encounter (HOSPITAL_COMMUNITY): Payer: Self-pay | Admitting: Anesthesiology

## 2012-09-17 ENCOUNTER — Inpatient Hospital Stay (HOSPITAL_COMMUNITY): Payer: Medicaid Other | Admitting: Anesthesiology

## 2012-09-17 ENCOUNTER — Encounter (HOSPITAL_COMMUNITY): Payer: Self-pay | Admitting: *Deleted

## 2012-09-17 DIAGNOSIS — Z22330 Carrier of Group B streptococcus: Secondary | ICD-10-CM

## 2012-09-17 DIAGNOSIS — O9989 Other specified diseases and conditions complicating pregnancy, childbirth and the puerperium: Secondary | ICD-10-CM

## 2012-09-17 LAB — TYPE AND SCREEN
ABO/RH(D): O POS
Antibody Screen: NEGATIVE

## 2012-09-17 MED ORDER — FENTANYL 2.5 MCG/ML BUPIVACAINE 1/10 % EPIDURAL INFUSION (WH - ANES)
INTRAMUSCULAR | Status: DC | PRN
  Administered 2012-09-17: 14 mL/h via EPIDURAL

## 2012-09-17 MED ORDER — BISACODYL 10 MG RE SUPP: 10.0000 mg | Freq: Every day | RECTAL | Status: DC | PRN

## 2012-09-17 MED ORDER — WITCH HAZEL-GLYCERIN EX PADS: 1.0000 "application " | MEDICATED_PAD | CUTANEOUS | Status: DC | PRN

## 2012-09-17 MED ORDER — BENZOCAINE-MENTHOL 20-0.5 % EX AERO: 1.0000 "application " | INHALATION_SPRAY | CUTANEOUS | Status: DC | PRN

## 2012-09-17 MED ORDER — OXYTOCIN 40 UNITS IN LACTATED RINGERS INFUSION - SIMPLE MED: 62.5000 mL/h | INTRAVENOUS | Status: DC | PRN

## 2012-09-17 MED ORDER — FERROUS SULFATE 325 (65 FE) MG PO TABS
325.0000 mg | ORAL_TABLET | Freq: Two times a day (BID) | ORAL | Status: DC
Start: 2012-09-17 — End: 2012-09-18
  Administered 2012-09-17 – 2012-09-18 (×2): 325 mg via ORAL
  Filled 2012-09-17 (×2): qty 1

## 2012-09-17 MED ORDER — PRENATAL MULTIVITAMIN CH
1.0000 | ORAL_TABLET | Freq: Every day | ORAL | Status: DC
  Administered 2012-09-17 – 2012-09-18 (×2): 1 via ORAL
  Filled 2012-09-17 (×2): qty 1

## 2012-09-17 MED ORDER — EPHEDRINE 5 MG/ML INJ
10.0000 mg | INTRAVENOUS | Status: DC | PRN
  Administered 2012-09-17: 10 mg via INTRAVENOUS
  Filled 2012-09-17: qty 2

## 2012-09-17 MED ORDER — IBUPROFEN 600 MG PO TABS
600.0000 mg | ORAL_TABLET | Freq: Four times a day (QID) | ORAL | Status: DC
  Administered 2012-09-17 – 2012-09-18 (×5): 600 mg via ORAL
  Filled 2012-09-17 (×4): qty 1

## 2012-09-17 MED ORDER — METHYLERGONOVINE MALEATE 0.2 MG/ML IJ SOLN: 0.2000 mg | INTRAMUSCULAR | Status: DC | PRN

## 2012-09-17 MED ORDER — FENTANYL 2.5 MCG/ML BUPIVACAINE 1/10 % EPIDURAL INFUSION (WH - ANES)
14.0000 mL/h | INTRAMUSCULAR | Status: DC | PRN
  Filled 2012-09-17: qty 125

## 2012-09-17 MED ORDER — LACTATED RINGERS IV SOLN
500.0000 mL | Freq: Once | INTRAVENOUS | Status: AC
  Administered 2012-09-17: 500 mL via INTRAVENOUS

## 2012-09-17 MED ORDER — DIPHENHYDRAMINE HCL 25 MG PO CAPS: 25.0000 mg | ORAL_CAPSULE | Freq: Four times a day (QID) | ORAL | Status: DC | PRN

## 2012-09-17 MED ORDER — ONDANSETRON HCL 4 MG PO TABS: 4.0000 mg | ORAL_TABLET | ORAL | Status: DC | PRN

## 2012-09-17 MED ORDER — METHYLERGONOVINE MALEATE 0.2 MG PO TABS: 0.2000 mg | ORAL_TABLET | ORAL | Status: DC | PRN

## 2012-09-17 MED ORDER — PHENYLEPHRINE 40 MCG/ML (10ML) SYRINGE FOR IV PUSH (FOR BLOOD PRESSURE SUPPORT)
80.0000 ug | PREFILLED_SYRINGE | INTRAVENOUS | Status: DC | PRN
  Filled 2012-09-17: qty 2

## 2012-09-17 MED ORDER — TETANUS-DIPHTH-ACELL PERTUSSIS 5-2.5-18.5 LF-MCG/0.5 IM SUSP: 0.5000 mL | Freq: Once | INTRAMUSCULAR | Status: DC

## 2012-09-17 MED ORDER — LIDOCAINE HCL (PF) 1 % IJ SOLN
INTRAMUSCULAR | Status: DC | PRN
  Administered 2012-09-17 (×2): 4 mL

## 2012-09-17 MED ORDER — ONDANSETRON HCL 4 MG/2ML IJ SOLN: 4.0000 mg | INTRAMUSCULAR | Status: DC | PRN

## 2012-09-17 MED ORDER — DIBUCAINE 1 % RE OINT: 1.0000 "application " | TOPICAL_OINTMENT | RECTAL | Status: DC | PRN

## 2012-09-17 MED ORDER — MEASLES, MUMPS & RUBELLA VAC ~~LOC~~ INJ
0.5000 mL | INJECTION | Freq: Once | SUBCUTANEOUS | Status: DC
  Filled 2012-09-17: qty 0.5

## 2012-09-17 MED ORDER — DIPHENHYDRAMINE HCL 50 MG/ML IJ SOLN: 12.5000 mg | INTRAMUSCULAR | Status: DC | PRN

## 2012-09-17 MED ORDER — SIMETHICONE 80 MG PO CHEW: 80.0000 mg | CHEWABLE_TABLET | ORAL | Status: DC | PRN

## 2012-09-17 MED ORDER — PHENYLEPHRINE 40 MCG/ML (10ML) SYRINGE FOR IV PUSH (FOR BLOOD PRESSURE SUPPORT)
80.0000 ug | PREFILLED_SYRINGE | INTRAVENOUS | Status: DC | PRN
  Filled 2012-09-17: qty 2
  Filled 2012-09-17: qty 5

## 2012-09-17 MED ORDER — LANOLIN HYDROUS EX OINT: TOPICAL_OINTMENT | CUTANEOUS | Status: DC | PRN

## 2012-09-17 MED ORDER — ZOLPIDEM TARTRATE 5 MG PO TABS: 5.0000 mg | ORAL_TABLET | Freq: Every evening | ORAL | Status: DC | PRN

## 2012-09-17 MED ORDER — EPHEDRINE 5 MG/ML INJ
10.0000 mg | INTRAVENOUS | Status: DC | PRN
  Filled 2012-09-17: qty 2
  Filled 2012-09-17: qty 4

## 2012-09-17 MED ORDER — FLEET ENEMA 7-19 GM/118ML RE ENEM: 1.0000 | ENEMA | Freq: Every day | RECTAL | Status: DC | PRN

## 2012-09-17 MED ORDER — OXYCODONE-ACETAMINOPHEN 5-325 MG PO TABS
1.0000 | ORAL_TABLET | ORAL | Status: DC | PRN
  Administered 2012-09-17: 1 via ORAL
  Administered 2012-09-17 – 2012-09-18 (×4): 2 via ORAL
  Filled 2012-09-17 (×2): qty 1
  Filled 2012-09-17 (×3): qty 2

## 2012-09-17 MED ORDER — SENNOSIDES-DOCUSATE SODIUM 8.6-50 MG PO TABS
2.0000 | ORAL_TABLET | Freq: Every day | ORAL | Status: DC
  Administered 2012-09-17: 2 via ORAL

## 2012-09-17 NOTE — Anesthesia Preprocedure Evaluation (Signed)
Anesthesia Evaluation  Patient identified by MRN, date of birth, ID band Patient awake    Reviewed: Allergy & Precautions, H&P , NPO status , Patient's Chart, lab work & pertinent test results  Airway Mallampati: III TM Distance: >3 FB Neck ROM: Full    Dental   Pulmonary asthma ,  breath sounds clear to auscultation        Cardiovascular negative cardio ROS  Rhythm:Regular Rate:Normal     Neuro/Psych negative neurological ROS  negative psych ROS   GI/Hepatic negative GI ROS, Neg liver ROS,   Endo/Other  negative endocrine ROSMorbid obesity  Renal/GU negative Renal ROS     Musculoskeletal negative musculoskeletal ROS (+)   Abdominal   Peds  Hematology negative hematology ROS (+)   Anesthesia Other Findings   Reproductive/Obstetrics negative OB ROS                           Anesthesia Physical Anesthesia Plan  ASA: III  Anesthesia Plan: Epidural   Post-op Pain Management:    Induction:   Airway Management Planned:   Additional Equipment:   Intra-op Plan:   Post-operative Plan:   Informed Consent: I have reviewed the patients History and Physical, chart, labs and discussed the procedure including the risks, benefits and alternatives for the proposed anesthesia with the patient or authorized representative who has indicated his/her understanding and acceptance.     Plan Discussed with:   Anesthesia Plan Comments:         Anesthesia Quick Evaluation

## 2012-09-17 NOTE — Progress Notes (Signed)
   Cana Mignano Slutsky is a 27 y.o. [phone] at [redacted]w[redacted]d  admitted for active labor, rupture of membranes  Subjective: Conmfortable with epidural..Requests not to have pitocin right now  Objective: BP 134/39  Pulse 88  Temp(Src) 97.9 F (36.6 C) (Oral)  Resp 20  Ht 5\' 4"  (1.626 m)  Wt 132.45 kg (292 lb)  BMI 50.1 kg/m2  SpO2 99%  LMP 10/04/2011    FHT:  FHR: 150 bpm, variability: moderate,  accelerations:  Present,  decelerations:  Present occ mild variable UC:   regular, every 2-3 minutes SVE:   Dilation: 6 Effacement (%): 90 Station: -2 Exam by:: foley,rn Labs: Lab Results  Component Value Date   WBC 16.6* 09/16/2012   HGB 12.2 09/16/2012   HCT 36.4 09/16/2012   MCV 81.8 09/16/2012   PLT 271 09/16/2012    Assessment / Plan: Spontaneous labor, progressing normally  Labor: Progressing normally Fetal Wellbeing:  Category I Pain Control:  epidural Anticipated MOD:  NSVD  CRESENZO-DISHMAN,Marlisa Caridi 09/17/2012, 5:13 AM

## 2012-09-17 NOTE — Progress Notes (Signed)
   Kaitlyn Walton is a 27 y.o. [phone] at [redacted]w[redacted]d  admitted for active labor, rupture of membranes  Subjective: Doing well with nubain  Objective: BP 122/64  Pulse 89  Temp(Src) 98.1 F (36.7 C) (Oral)  Resp 20  Ht 5\' 4"  (1.626 m)  Wt 132.45 kg (292 lb)  BMI 50.1 kg/m2  LMP 10/04/2011    FHT:  FHR: 150 bpm, variability: moderate,  accelerations:  Present,  decelerations:  Present occ mild variable UC:   regular, every 2-3 minutes SVE:   Dilation: 5 Effacement (%): 80 Station: -2 Exam by:: c.[email or web address],rnc Labs: Lab Results  Component Value Date   WBC 16.6* 09/16/2012   HGB 12.2 09/16/2012   HCT 36.4 09/16/2012   MCV 81.8 09/16/2012   PLT 271 09/16/2012    Assessment / Plan: Spontaneous labor, progressing normally  Labor: Progressing normally Fetal Wellbeing:  Category I Pain Control:  Nubain Anticipated MOD:  NSVD  CRESENZO-DISHMAN,Jahquez Steffler 09/17/2012, 2:00 AM

## 2012-09-17 NOTE — Anesthesia Procedure Notes (Signed)
Epidural Patient location during procedure: OB Start time: 09/17/2012 3:50 AM End time: 09/17/2012 4:05 AM  Staffing Anesthesiologist: Lewie Loron R  Preanesthetic Checklist Completed: patient identified, pre-op evaluation, timeout performed, IV checked, risks and benefits discussed and monitors and equipment checked  Epidural Patient position: sitting Prep: site prepped and draped and DuraPrep Patient monitoring: heart rate, continuous pulse ox and blood pressure Approach: midline Injection technique: LOR air and LOR saline  Needle:  Needle type: Tuohy  Needle gauge: 17 G Needle length: 9 cm Needle insertion depth: 9 cm Catheter type: closed end flexible Catheter size: 19 Gauge Catheter at skin depth: 15 cm Test dose: negative  Assessment Sensory level: T8 Events: blood not aspirated, injection not painful, no injection resistance, negative IV test and no paresthesia  Additional Notes Reason for block:procedure for pain

## 2012-09-17 NOTE — H&P (Signed)
Attestation of Attending Supervision of Advanced Practitioner (CNM/NP): Evaluation and management procedures were performed by the Advanced Practitioner under my supervision and collaboration. I have reviewed the Advanced Practitioner's note and chart, and I agree with the management and plan.  Brooklyn Jeff H. 8:33 AM   

## 2012-09-18 ENCOUNTER — Encounter: Payer: Self-pay | Admitting: Obstetrics & Gynecology

## 2012-09-18 ENCOUNTER — Encounter: Payer: Self-pay | Admitting: Family Medicine

## 2012-09-18 MED ORDER — IBUPROFEN 600 MG PO TABS: 600.0000 mg | ORAL_TABLET | Freq: Four times a day (QID) | ORAL | Status: DC | PRN

## 2012-09-18 NOTE — Anesthesia Postprocedure Evaluation (Signed)
  Anesthesia Post-op Note  Patient: Kaitlyn Walton  Procedure(s) Performed: * No procedures listed *  Patient Location: Mother/Baby  Anesthesia Type:Epidural  Level of Consciousness: sedated  Airway and Oxygen Therapy: Patient Spontanous Breathing  Post-op Pain: none  Post-op Assessment: Post-op Vital signs reviewed and Patient's Cardiovascular Status Stable  Post-op Vital Signs: Reviewed and stable  Complications: No apparent anesthesia complications

## 2012-09-18 NOTE — Progress Notes (Signed)
Ur chart review completed.  

## 2012-09-18 NOTE — Discharge Summary (Signed)
Obstetric Discharge Summary Reason for Admission: rupture of membranes Prenatal Procedures: none Intrapartum Procedures: spontaneous vaginal delivery Postpartum Procedures: none Complications-Operative and Postpartum: none Hemoglobin  Date Value Range Status  09/16/2012 12.2  12.0 - 15.0 g/dL Final     HCT  Date Value Range Status  09/16/2012 36.4  36.0 - 46.0 % Final    Physical Exam:  General: alert, cooperative and no distress Lochia: appropriate Uterine Fundus: difficult to feel secondary to body habitus Incision: n/a DVT Evaluation: No evidence of DVT seen on physical exam. Negative Homan's sign. No cords or calf tenderness. No significant calf/ankle edema.  Hospital Course: Kaitlyn Walton is a 27 y.o. [id number] at [redacted]w[redacted]d who was admitted to the hospital after presenting for rupture of membranes. Her labor progressed without any labor augmentation and successfully vaginally delivered a healthy viable boy at 7:04am on 09/17/2012. Pt is breast feeding and is unsure of what method of contraception she will use but is leaning towards Mirena. She will follow up with low risk clinic at [facility] in 6 weeks.   Discharge Diagnoses: Term Pregnancy-delivered  Discharge Information: Date: 09/18/2012 Activity: pelvic rest Diet: routine Medications: PNV and Ibuprofen Condition: stable Instructions: refer to practice specific booklet Discharge to: home   Newborn Data: Live born female  Birth Weight: 7 lb 6.7 oz (3365 g) APGAR: 8, 9  Home with mother per pediatrician.  I have seen and examined this patient and agree with above documentation in the resident's note.   Rulon Abide, M.D. [facility] Fellow 09/18/2012 9:11 AM

## 2012-09-18 NOTE — Progress Notes (Signed)
Post Partum Day 1 Subjective: no complaints, up ad lib, voiding, tolerating PO and + flatus  Objective: Blood pressure 131/74, pulse 83, temperature 98.3 F (36.8 C), temperature source Oral, resp. rate 18, height 5\' 4"  (1.626 m), weight 292 lb (132.45 kg), last menstrual period 10/04/2011, SpO2 99.00%, unknown if currently breastfeeding.  Physical Exam:  General: alert, cooperative and no distress Lochia: appropriate Uterine Fundus: difficult to feel secondary to body habitus Incision: n/a DVT Evaluation: No evidence of DVT seen on physical exam. Negative Homan's sign. No cords or calf tenderness.   Recent Labs  09/16/12 2300  HGB 12.2  HCT 36.4    Assessment/Plan: Breastfeeding and Contraception thinks likely Mirena, but unsure. Aware of options available to her. Likely discharge today if baby is discharged.   LOS: 2 days   Latrelle Dodrill, MD  09/18/2012, 7:26 AM   I have seen and examined this patient and agree with above documentation in the resident's note.   Rulon Abide, M.D. [facility] Fellow 09/18/2012 9:07 AM

## 2012-09-19 NOTE — Discharge Summary (Signed)
Attestation of Attending Supervision of Advanced Practitioner (CNM/NP): Evaluation and management procedures were performed by the Advanced Practitioner under my supervision and collaboration.  I have reviewed the Advanced Practitioner's note and chart, and I agree with the management and plan.  Dante Cooter 09/19/2012 9:12 AM   

## 2012-09-20 ENCOUNTER — Encounter: Payer: Self-pay | Admitting: Obstetrics and Gynecology

## 2012-09-21 ENCOUNTER — Ambulatory Visit (HOSPITAL_COMMUNITY): Payer: Medicaid Other

## 2012-09-25 ENCOUNTER — Ambulatory Visit (HOSPITAL_COMMUNITY): Admission: RE | Admit: 2012-09-25 | Payer: Medicaid Other | Source: Ambulatory Visit

## 2012-10-18 ENCOUNTER — Ambulatory Visit (INDEPENDENT_AMBULATORY_CARE_PROVIDER_SITE_OTHER): Payer: Medicaid Other | Admitting: Obstetrics & Gynecology

## 2012-12-06 ENCOUNTER — Other Ambulatory Visit: Payer: Self-pay

## 2012-12-14 ENCOUNTER — Ambulatory Visit (INDEPENDENT_AMBULATORY_CARE_PROVIDER_SITE_OTHER): Payer: Medicaid Other | Admitting: Obstetrics & Gynecology

## 2012-12-14 ENCOUNTER — Encounter: Payer: Self-pay | Admitting: Obstetrics & Gynecology

## 2012-12-14 ENCOUNTER — Other Ambulatory Visit (HOSPITAL_COMMUNITY)
Admission: RE | Admit: 2012-12-14 | Discharge: 2012-12-14 | Disposition: A | Discharge status: Home / Self Care | Payer: Medicaid Other | Source: Ambulatory Visit | Attending: Obstetrics & Gynecology | Admitting: Obstetrics & Gynecology

## 2012-12-14 DIAGNOSIS — Z124 Encounter for screening for malignant neoplasm of cervix: Secondary | ICD-10-CM | POA: Insufficient documentation

## 2012-12-14 DIAGNOSIS — Z1151 Encounter for screening for human papillomavirus (HPV): Secondary | ICD-10-CM | POA: Insufficient documentation

## 2012-12-14 DIAGNOSIS — R87619 Unspecified abnormal cytological findings in specimens from cervix uteri: Secondary | ICD-10-CM | POA: Insufficient documentation

## 2012-12-14 NOTE — Patient Instructions (Signed)

## 2012-12-14 NOTE — Addendum Note (Signed)
Addended by: Sherre Lain A on: 12/14/2012 11:50 AM   Modules accepted: Orders

## 2012-12-14 NOTE — Progress Notes (Signed)
Patient ID: Kaitlyn Walton, female   DOB: 06/15/1985, 27 y.o.   MRN: [id number] Subjective:     Kaitlyn Walton is a 27 y.o. female who presents for a postpartum visit. She is 10 weeks postpartum following a spontaneous vaginal delivery. I have fully reviewed the prenatal and intrapartum course. The delivery was at term. Outcome: spontaneous vaginal delivery. Postpartum course has been uncomplicated. Baby's course has been uncomplicated. Baby is feeding by bottle. Bleeding thin lochia. Bowel function is normal. Bladder function is normal. Patient is sexually active. Contraception method is condoms. Postpartum depression screening: negative.  The following portions of the patient's history were reviewed and updated as appropriate: allergies, current medications, past family history, past medical history, past social history, past surgical history and problem list.  Review of Systems A comprehensive review of systems was negative.   Objective:    BP 132/85  Pulse 94  Ht 5\' 4"  (1.626 m)  Wt 281 lb 3.2 oz (127.551 kg)  BMI 48.24 kg/m2  LMP 12/12/2012  Breastfeeding? No  General:  alert and no distress           Abdomen: soft, non-tender; bowel sounds normal; no masses,  no organomegaly and obese   Vulva:  normal  Vagina: normal vagina  Cervix:  no cervical motion tenderness  Corpus: normal size, contour, position, consistency, mobility, non-tender  Adnexa:  normal adnexa  Rectal Exam: Not performed.        Assessment:     10 weeks postpartum exam. Pap smear done at today's visit.   Plan:    1. Contraception: condoms currently 2. Sprintec 1 po q day 3. Follow up in: 1 year or as needed.  4. F/u PAP and cx

## 2013-01-04 ENCOUNTER — Ambulatory Visit (HOSPITAL_COMMUNITY)
Admission: RE | Admit: 2013-01-04 | Discharge: 2013-01-04 | Disposition: A | Discharge status: Home / Self Care | Payer: Medicaid Other | Source: Ambulatory Visit | Attending: Internal Medicine | Admitting: Internal Medicine

## 2013-01-04 ENCOUNTER — Other Ambulatory Visit (HOSPITAL_COMMUNITY): Payer: Self-pay | Admitting: Internal Medicine

## 2013-01-04 DIAGNOSIS — M545 Low back pain, unspecified: Secondary | ICD-10-CM | POA: Insufficient documentation

## 2013-03-06 ENCOUNTER — Encounter: Payer: Self-pay | Admitting: Dietician

## 2013-03-06 ENCOUNTER — Encounter: Payer: Medicaid Other | Attending: Internal Medicine | Admitting: Dietician

## 2013-03-06 VITALS — Ht 64.0 in | Wt 293.2 lb

## 2013-03-06 DIAGNOSIS — Z713 Dietary counseling and surveillance: Secondary | ICD-10-CM | POA: Insufficient documentation

## 2013-03-06 DIAGNOSIS — E669 Obesity, unspecified: Secondary | ICD-10-CM | POA: Insufficient documentation

## 2013-03-06 DIAGNOSIS — Z6841 Body Mass Index (BMI) 40.0 and over, adult: Secondary | ICD-10-CM | POA: Insufficient documentation

## 2013-03-06 NOTE — Patient Instructions (Addendum)
Goals:  Keep following "Plate Method" for portion control  Limit carbohydrate 1-2 servings/meal   Keep choosing more whole grains, lean protein, low-fat dairy, and fruits/non-starchy vegetables.   Continue >30 min of physical activity daily  Keep drinking lots of water!  Prepare snacks and on-the-go meals with a protein + a carbohydrate  Take time during breaks at school to enjoy a meal or snack.  Avoid meal-skipping. Eat 3 meals a day + small snacks if you are hungry between meals. Take the time to pre-prepare your foods 1-2 times a week.  -Meals: Wrap with [place] and lettuce or leftovers  -Snacks: veggies, cheese, nuts, fruit, yogurt, hummus  Take care of YOU! girl's nights, napping if possible, bubble baths, accepting help when you can, listening to music

## 2013-03-06 NOTE — Progress Notes (Signed)
  Medical Nutrition Therapy:  Appt start time: 0800 end time:  0845.   Assessment:  Kaitlyn Walton is here today with concerns about her weight. In the past she has tried exercising and counting calories. She tried HCG drops and an extreme 500-calorie eating plan, which she reports worked for a while. Kaitlyn Walton lost 50 pounds on this diet and then gained 30 pounds with her pregnancy. She then gained 30 more pounds after pregnancy. She is no longer breastfeeding.  Kaitlyn Walton reports that she sometimes skips meals until dinnertime, as she has a hectic schedule. 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 She has 53 kids: a 29 year old, 29 year old, and 505 month old. Her significant other helps with cooking and preparing foods, and she performs the grocery shopping. Her snacks include cheese, celery, carrots, spinach-artichoke dip, tzatziki dip, fruit: strawberries and grapes, bananas in the middle of the night, mini sweet peppers, cucumbers, and deli Malawiturkey. She does not eat fast food or drink sodas. The family rarely has chips and cookies. For dinner they might have baked meat with broccoli and rice and crock-pot foods. The patient reports that the skips lunch typically and sometimes may not eat at all until nighttime. Generally doesn't snack at night after dinner. The family eats together, sometimes with the TV on. Kaitlyn Walton states that she trusts her hunger signals but prefers structure and "boundaries" regarding foods and diet. She is thinking of beginning the "Leptin Diet."  Preferred Learning Style:   No preference indicated   Learning Readiness:   Not ready   MEDICATIONS: see list  Usual physical activity: Gym 2x a week: cardio, weight machines and free weights; walking when it's warm  Estimated energy needs: 1500-2000 calories   Progress Towards Goal(s):  No progress.   Nutritional Diagnosis:  [place]-3.3 Overweight/obesity related to poor dietary habits  as evidenced by BMI 50.4.    Intervention:  Nutrition education provided.  Teaching Method Utilized:  Auditory   Handouts given during visit include:  MyPlate  15 g carb + protein snacks  Barriers to learning/adherence to lifestyle change: schedule  Demonstrated degree of understanding via:  Teach Back   Monitoring/Evaluation:  Dietary intake, exercise, and body weight in 2 month(s).

## 2013-05-08 ENCOUNTER — Encounter: Payer: Medicaid Other | Attending: Internal Medicine | Admitting: Dietician

## 2013-05-08 VITALS — Ht 64.0 in | Wt 274.8 lb

## 2013-05-08 DIAGNOSIS — Z713 Dietary counseling and surveillance: Secondary | ICD-10-CM | POA: Insufficient documentation

## 2013-05-08 DIAGNOSIS — E669 Obesity, unspecified: Secondary | ICD-10-CM

## 2013-05-08 DIAGNOSIS — Z6841 Body Mass Index (BMI) 40.0 and over, adult: Secondary | ICD-10-CM | POA: Insufficient documentation

## 2013-05-08 NOTE — Progress Notes (Signed)
  Medical Nutrition Therapy:  Appt start time: 0800 end time:  0815.   Follow-up:   Kaitlyn Walton returns today having lost 19 pounds in the last 2 months. She reports feeling weight loss in her clothes and having more energy. She states she has been eating a bigger "brunch" and snacking on celery and peanut butter and yogurt between classes. Kaitlyn Walton says she likes coming here because it holds her accountable. She seems very motivated and is trying to make healthy lifestyle changes for her whole family. Has small high-protein meals when she has to eat late at night. She is delegating more to her significant other to manage her stress at home.  Preferred Learning Style:   No preference indicated   Learning Readiness:   Ready   MEDICATIONS: see list  Usual physical activity: Walking as a family when it's warm  Estimated energy needs: 1500-2000 calories   Progress Towards Goal(s):  No progress.   Nutritional Diagnosis:  Kaitlyn Walton-3.3 Overweight/obesity related to poor dietary habits as evidenced by BMI 50.4.    Intervention:  Nutrition education provided.  Teaching Method Utilized:  Auditory   Barriers to learning/adherence to lifestyle change: schedule  Demonstrated degree of understanding via:  Teach Back   Monitoring/Evaluation:  Dietary intake, exercise, and body weight in 2 month(s).

## 2013-05-08 NOTE — Patient Instructions (Signed)
Goals:  Keep following "Plate Method" for portion control  Limit carbohydrate 1-2 servings/meal   Keep choosing more whole grains, lean protein, low-fat dairy, and fruits/non-starchy vegetables.   Continue >30 min of physical activity daily  Keep drinking lots of water!  Prepare snacks and on-the-go meals with a protein + a carbohydrate  Take time during breaks at school to enjoy a meal or snack.  Avoid meal-skipping. Eat 3 meals a day + small snacks if you are hungry between meals. Take the time to pre-prepare your foods 1-2 times a week.  -Meals: Wrap with [place] and lettuce or leftovers  -Snacks: veggies, cheese, nuts, fruit, yogurt, hummus  Take care of YOU! girl's nights, napping if possible, bubble baths, accepting help when you can, listening to music

## 2013-05-22 ENCOUNTER — Encounter: Payer: Self-pay | Admitting: Obstetrics

## 2013-06-11 ENCOUNTER — Ambulatory Visit (INDEPENDENT_AMBULATORY_CARE_PROVIDER_SITE_OTHER): Payer: Medicaid Other | Admitting: Obstetrics

## 2013-06-11 ENCOUNTER — Encounter: Payer: Self-pay | Admitting: Obstetrics

## 2013-06-11 VITALS — BP 142/89 | HR 80 | Temp 98.3°F | Ht 64.0 in | Wt 266.0 lb

## 2013-06-11 DIAGNOSIS — N926 Irregular menstruation, unspecified: Secondary | ICD-10-CM

## 2013-06-11 DIAGNOSIS — Z3009 Encounter for other general counseling and advice on contraception: Secondary | ICD-10-CM

## 2013-06-11 DIAGNOSIS — N939 Abnormal uterine and vaginal bleeding, unspecified: Secondary | ICD-10-CM

## 2013-06-11 LAB — POCT URINE PREGNANCY: Preg Test, Ur: NEGATIVE

## 2013-06-11 MED ORDER — NORGESTIMATE-ETH ESTRADIOL 0.25-35 MG-MCG PO TABS: 1.0000 | ORAL_TABLET | Freq: Every day | ORAL | Status: DC

## 2013-06-12 ENCOUNTER — Encounter: Payer: Self-pay | Admitting: Obstetrics

## 2013-06-12 LAB — GC/CHLAMYDIA PROBE AMP
CT Probe RNA: NEGATIVE
GC Probe RNA: NEGATIVE

## 2013-06-12 LAB — WET PREP BY MOLECULAR PROBE
Candida species: NEGATIVE
Gardnerella vaginalis: POSITIVE — AB
Trichomonas vaginosis: NEGATIVE

## 2013-06-12 NOTE — Progress Notes (Signed)
Patient ID: Kaitlyn Walton, female   DOB: 05/02/1985, 28 y.o.   MRN: [id number]  Chief Complaint  Patient presents with  . Menstrual Problem    HPI Kaitlyn Walton is a 28 y.o. female.  H/o irregular cycles.  HPI  Past Medical History  Diagnosis Date  . Asthma   . PCOS (polycystic ovarian syndrome)   . Obesity     Past Surgical History  Procedure Laterality Date  . Mouth surgery    . Lung surgery      Family History  Problem Relation Age of Onset  . Diabetes Mother   . Cancer Maternal Aunt   . Hypertension Maternal Grandmother   . Hypertension Maternal Grandfather   . Cancer Maternal Grandfather   . Hypertension Paternal Grandmother   . Hypertension Paternal Grandfather   . Asthma Other   . GI problems Other   . Hyperlipidemia Other   . Obesity Other   . Stroke Other   . Sleep apnea Other     Social History History  Substance Use Topics  . Smoking status: Never Smoker   . Smokeless tobacco: Never Used  . Alcohol Use: No    Allergies  Allergen Reactions  . Cefaclor Other (See Comments)    "ceclor"= childhood allergy  . Sulfonamide Derivatives Other (See Comments)    Sulfa= childhood allergy    Current Outpatient Prescriptions  Medication Sig Dispense Refill  . albuterol (PROVENTIL HFA;VENTOLIN HFA) 108 (90 BASE) MCG/ACT inhaler Inhale 2 puffs into the lungs every 6 (six) hours as needed for wheezing. For wheezing  18 g  2  . ibuprofen (ADVIL,MOTRIN) 600 MG tablet Take 1 tablet (600 mg total) by mouth every 6 (six) hours as needed for pain.  30 tablet  0  . norgestimate-ethinyl estradiol (ORTHO-CYCLEN,SPRINTEC,PREVIFEM) 0.25-35 MG-MCG tablet Take 1 tablet by mouth daily.  1 Package  11   No current facility-administered medications for this visit.    Review of Systems Review of Systems Constitutional: negative for fatigue and weight loss Respiratory: negative for cough and wheezing Cardiovascular: negative for chest pain, fatigue and  palpitations Gastrointestinal: negative for abdominal pain and change in bowel habits Genitourinary:negative Integument/breast: negative for nipple discharge Musculoskeletal:negative for myalgias Neurological: negative for gait problems and tremors Behavioral/Psych: negative for abusive relationship, depression Endocrine: negative for temperature intolerance     Blood pressure 142/89, pulse 80, temperature 98.3 F (36.8 C), height 5\' 4"  (1.626 m), weight 266 lb (120.657 kg).  Physical Exam Physical Exam:  Deferred  100% of 10 min visit spent on counseling and coordination of care.   Data Reviewed Labs  Assessment    Irregular periods. Obesity PCOS  Has plans for future fertility.    Plan    OCP's and Metformin recommended.  She agreed to [employer]'s. Diet and exercise recommended.   Orders Placed This Encounter  Procedures  . WET PREP BY MOLECULAR PROBE  . GC/Chlamydia Probe Amp  . POCT urine pregnancy   Meds ordered this encounter  Medications  . norgestimate-ethinyl estradiol (ORTHO-CYCLEN,SPRINTEC,PREVIFEM) 0.25-35 MG-MCG tablet    Sig: Take 1 tablet by mouth daily.    Dispense:  1 Package    Refill:  11    Need to obtain previous records.   Brock Badharles A Kolyn Rozario 06/12/2013, 11:50 AM

## 2013-06-18 ENCOUNTER — Other Ambulatory Visit: Payer: Self-pay | Admitting: *Deleted

## 2013-06-18 DIAGNOSIS — B9689 Other specified bacterial agents as the cause of diseases classified elsewhere: Secondary | ICD-10-CM

## 2013-06-18 DIAGNOSIS — N760 Acute vaginitis: Principal | ICD-10-CM

## 2013-06-18 MED ORDER — METRONIDAZOLE 500 MG PO TABS: 500.0000 mg | ORAL_TABLET | Freq: Two times a day (BID) | ORAL | Status: DC

## 2013-07-08 ENCOUNTER — Ambulatory Visit: Payer: Self-pay | Admitting: Dietician

## 2013-07-10 ENCOUNTER — Telehealth: Payer: Self-pay | Admitting: *Deleted

## 2013-07-10 NOTE — Telephone Encounter (Signed)
Patient is requesting a Rx for Metformin for POCS to take in addition to her OCP. 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 Patient has decided to try again.

## 2013-07-12 NOTE — Telephone Encounter (Signed)
Patient should make appt.

## 2013-07-16 NOTE — Telephone Encounter (Signed)
[facility]

## 2013-07-26 ENCOUNTER — Ambulatory Visit (INDEPENDENT_AMBULATORY_CARE_PROVIDER_SITE_OTHER): Payer: Medicaid Other | Admitting: Obstetrics

## 2013-07-26 ENCOUNTER — Encounter: Payer: Self-pay | Admitting: Obstetrics

## 2013-07-26 VITALS — BP 121/83 | HR 83 | Temp 98.5°F | Ht 64.0 in | Wt 263.0 lb

## 2013-07-26 DIAGNOSIS — E669 Obesity, unspecified: Secondary | ICD-10-CM

## 2013-07-26 DIAGNOSIS — N939 Abnormal uterine and vaginal bleeding, unspecified: Secondary | ICD-10-CM

## 2013-07-26 DIAGNOSIS — N926 Irregular menstruation, unspecified: Secondary | ICD-10-CM

## 2013-07-26 DIAGNOSIS — E282 Polycystic ovarian syndrome: Secondary | ICD-10-CM

## 2013-07-26 MED ORDER — METFORMIN HCL ER 500 MG PO TB24: ORAL_TABLET | ORAL | Status: DC

## 2013-07-27 ENCOUNTER — Encounter: Payer: Self-pay | Admitting: Obstetrics

## 2013-07-27 DIAGNOSIS — E669 Obesity, unspecified: Secondary | ICD-10-CM | POA: Insufficient documentation

## 2013-07-27 DIAGNOSIS — E282 Polycystic ovarian syndrome: Secondary | ICD-10-CM | POA: Insufficient documentation

## 2013-07-27 NOTE — Progress Notes (Addendum)
Patient ID: Kaitlyn Walton, female   DOB: 02/18/1985, 28 y.o.   MRN: [id number]  Chief Complaint  Patient presents with  . Follow-up    Medication Managment     HPI Kaitlyn Walton is a 28 y.o. female.  H/O AUB, obesity and PCOS.  Metformin recommended on last visit and patient now agrees to start it. HPI  Past Medical History  Diagnosis Date  . Asthma   . PCOS (polycystic ovarian syndrome)   . Obesity     Past Surgical History  Procedure Laterality Date  . Mouth surgery    . Lung surgery      Family History  Problem Relation Age of Onset  . Diabetes Mother   . Cancer Maternal Aunt   . Hypertension Maternal Grandmother   . Hypertension Maternal Grandfather   . Cancer Maternal Grandfather   . Hypertension Paternal Grandmother   . Hypertension Paternal Grandfather   . Asthma Other   . GI problems Other   . Hyperlipidemia Other   . Obesity Other   . Stroke Other   . Sleep apnea Other     Social History History  Substance Use Topics  . Smoking status: Never Smoker   . Smokeless tobacco: Never Used  . Alcohol Use: No    Allergies  Allergen Reactions  . Cefaclor Other (See Comments)    "ceclor"= childhood allergy  . Sulfonamide Derivatives Other (See Comments)    Sulfa= childhood allergy    Current Outpatient Prescriptions  Medication Sig Dispense Refill  . albuterol (PROVENTIL HFA;VENTOLIN HFA) 108 (90 BASE) MCG/ACT inhaler Inhale 2 puffs into the lungs every 6 (six) hours as needed for wheezing. For wheezing  18 g  2  . ibuprofen (ADVIL,MOTRIN) 600 MG tablet Take 1 tablet (600 mg total) by mouth every 6 (six) hours as needed for pain.  30 tablet  0  . norgestimate-ethinyl estradiol (ORTHO-CYCLEN,SPRINTEC,PREVIFEM) 0.25-35 MG-MCG tablet Take 1 tablet by mouth daily.  1 Package  11  . metFORMIN (GLUCOPHAGE XR) 500 MG 24 hr tablet Take 3 tablets ( 1500mg  ) po daily with supper.  90 tablet  11   No current facility-administered medications for this  visit.    Review of Systems Review of Systems Constitutional: negative for fatigue and weight loss Respiratory: negative for cough and wheezing Cardiovascular: negative for chest pain, fatigue and palpitations Gastrointestinal: negative for abdominal pain and change in bowel habits Genitourinary:negative Integument/breast: negative for nipple discharge Musculoskeletal:negative for myalgias Neurological: negative for gait problems and tremors Behavioral/Psych: negative for abusive relationship, depression Endocrine: negative for temperature intolerance     Blood pressure 121/83, pulse 83, temperature 98.5 F (36.9 C), height 5\' 4"  (1.626 m), weight 263 lb (119.296 kg), last menstrual period 07/12/2013.  Physical Exam Physical Exam:  Deferred.  Consult only.  100% of 10 min visit spent on counseling and coordination of care.   Data Reviewed Labs  Assessment    PCOS Obesity AUB     Plan    Metformin XR Rx. Continue OCP's Diet and exercise recommended.  No orders of the defined types were placed in this encounter.   Meds ordered this encounter  Medications  . metFORMIN (GLUCOPHAGE XR) 500 MG 24 hr tablet    Sig: Take 3 tablets ( 1500mg  ) po daily with supper.    Dispense:  90 tablet    Refill:  11      HARPER,CHARLES A 07/27/2013, 5:41 AM

## 2013-08-14 NOTE — Telephone Encounter (Signed)
[id number] - 4:25pm - brm   - Patient has appt  In September and is ok with meds

## 2013-10-14 ENCOUNTER — Ambulatory Visit: Payer: Medicaid Other | Admitting: Obstetrics

## 2013-12-02 ENCOUNTER — Encounter: Payer: Self-pay | Admitting: Obstetrics

## 2014-06-18 ENCOUNTER — Encounter (HOSPITAL_COMMUNITY): Payer: Self-pay

## 2014-06-18 ENCOUNTER — Inpatient Hospital Stay (HOSPITAL_COMMUNITY): Payer: Medicaid Other | Admitting: Anesthesiology

## 2014-06-18 ENCOUNTER — Inpatient Hospital Stay (HOSPITAL_COMMUNITY)
Admission: EM | Admit: 2014-06-18 | Discharge: 2014-06-21 | DRG: 777 | Disposition: A | Discharge status: Home / Self Care | Payer: Medicaid Other | Attending: Obstetrics | Admitting: Obstetrics

## 2014-06-18 ENCOUNTER — Encounter (HOSPITAL_COMMUNITY)
Admission: EM | Disposition: A | Discharge status: Home / Self Care | Payer: Self-pay | Source: Home / Self Care | Attending: Obstetrics

## 2014-06-18 ENCOUNTER — Emergency Department (HOSPITAL_COMMUNITY): Payer: Medicaid Other

## 2014-06-18 DIAGNOSIS — E282 Polycystic ovarian syndrome: Secondary | ICD-10-CM | POA: Diagnosis present

## 2014-06-18 DIAGNOSIS — O001 Tubal pregnancy: Principal | ICD-10-CM | POA: Diagnosis present

## 2014-06-18 DIAGNOSIS — O009 Unspecified ectopic pregnancy without intrauterine pregnancy: Secondary | ICD-10-CM

## 2014-06-18 DIAGNOSIS — J45909 Unspecified asthma, uncomplicated: Secondary | ICD-10-CM | POA: Diagnosis present

## 2014-06-18 DIAGNOSIS — E669 Obesity, unspecified: Secondary | ICD-10-CM | POA: Diagnosis present

## 2014-06-18 DIAGNOSIS — Z79899 Other long term (current) drug therapy: Secondary | ICD-10-CM

## 2014-06-18 DIAGNOSIS — Z881 Allergy status to other antibiotic agents status: Secondary | ICD-10-CM | POA: Diagnosis not present

## 2014-06-18 DIAGNOSIS — Z882 Allergy status to sulfonamides status: Secondary | ICD-10-CM

## 2014-06-18 DIAGNOSIS — R102 Pelvic and perineal pain: Secondary | ICD-10-CM

## 2014-06-18 DIAGNOSIS — Z6841 Body Mass Index (BMI) 40.0 and over, adult: Secondary | ICD-10-CM | POA: Diagnosis not present

## 2014-06-18 DIAGNOSIS — O00109 Unspecified tubal pregnancy without intrauterine pregnancy: Secondary | ICD-10-CM | POA: Diagnosis present

## 2014-06-18 DIAGNOSIS — N939 Abnormal uterine and vaginal bleeding, unspecified: Secondary | ICD-10-CM

## 2014-06-18 HISTORY — PX: LAPAROTOMY: SHX154

## 2014-06-18 LAB — CBC WITH DIFFERENTIAL/PLATELET
BASOS ABS: 0.1 10*3/uL (ref 0.0–0.1)
Basophils Relative: 1 % (ref 0–1)
EOS PCT: 7 % — AB (ref 0–5)
Eosinophils Absolute: 0.8 10*3/uL — ABNORMAL HIGH (ref 0.0–0.7)
HEMATOCRIT: 36.9 % (ref 36.0–46.0)
HEMOGLOBIN: 11.8 g/dL — AB (ref 12.0–15.0)
LYMPHS ABS: 3 10*3/uL (ref 0.7–4.0)
LYMPHS PCT: 29 % (ref 12–46)
MCH: 28.4 pg (ref 26.0–34.0)
MCHC: 32 g/dL (ref 30.0–36.0)
MCV: 88.7 fL (ref 78.0–100.0)
MONO ABS: 0.6 10*3/uL (ref 0.1–1.0)
MONOS PCT: 6 % (ref 3–12)
NEUTROS ABS: 5.8 10*3/uL (ref 1.7–7.7)
Neutrophils Relative %: 57 % (ref 43–77)
Platelets: 340 10*3/uL (ref 150–400)
RBC: 4.16 MIL/uL (ref 3.87–5.11)
RDW: 13 % (ref 11.5–15.5)
WBC: 10.3 10*3/uL (ref 4.0–10.5)

## 2014-06-18 LAB — COMPREHENSIVE METABOLIC PANEL
ALT: 30 U/L (ref 14–54)
ANION GAP: 6 (ref 5–15)
AST: 20 U/L (ref 15–41)
Albumin: 3.8 g/dL (ref 3.5–5.0)
Alkaline Phosphatase: 55 U/L (ref 38–126)
BILIRUBIN TOTAL: 0.5 mg/dL (ref 0.3–1.2)
BUN: 13 mg/dL (ref 6–20)
CHLORIDE: 106 mmol/L (ref 101–111)
CO2: 27 mmol/L (ref 22–32)
CREATININE: 0.61 mg/dL (ref 0.44–1.00)
Calcium: 8.8 mg/dL — ABNORMAL LOW (ref 8.9–10.3)
GLUCOSE: 133 mg/dL — AB (ref 65–99)
Potassium: 4 mmol/L (ref 3.5–5.1)
Sodium: 139 mmol/L (ref 135–145)
Total Protein: 6.9 g/dL (ref 6.5–8.1)

## 2014-06-18 LAB — WET PREP, GENITAL
Clue Cells Wet Prep HPF POC: NONE SEEN
Trich, Wet Prep: NONE SEEN
YEAST WET PREP: NONE SEEN

## 2014-06-18 LAB — URINALYSIS, ROUTINE W REFLEX MICROSCOPIC
BILIRUBIN URINE: NEGATIVE
GLUCOSE, UA: NEGATIVE mg/dL
KETONES UR: NEGATIVE mg/dL
Leukocytes, UA: NEGATIVE
NITRITE: NEGATIVE
PH: 8.5 — AB (ref 5.0–8.0)
Protein, ur: NEGATIVE mg/dL
SPECIFIC GRAVITY, URINE: 1.02 (ref 1.005–1.030)
Urobilinogen, UA: 0.2 mg/dL (ref 0.0–1.0)

## 2014-06-18 LAB — URINE MICROSCOPIC-ADD ON

## 2014-06-18 LAB — LIPASE, BLOOD: LIPASE: 23 U/L (ref 22–51)

## 2014-06-18 LAB — POC URINE PREG, ED: Preg Test, Ur: POSITIVE — AB

## 2014-06-18 LAB — HCG, QUANTITATIVE, PREGNANCY: HCG, BETA CHAIN, QUANT, S: 12312 m[IU]/mL — AB (ref <5)

## 2014-06-18 SURGERY — LAPAROTOMY
Anesthesia: General

## 2014-06-18 MED ORDER — LACTATED RINGERS IV BOLUS (SEPSIS): 1000.0000 mL | Freq: Once | INTRAVENOUS | Status: DC

## 2014-06-18 MED ORDER — PROPOFOL INFUSION 10 MG/ML OPTIME: INTRAVENOUS | Status: DC | PRN

## 2014-06-18 MED ORDER — LIDOCAINE HCL (CARDIAC) 20 MG/ML IV SOLN
INTRAVENOUS | Status: AC
  Filled 2014-06-18: qty 5

## 2014-06-18 MED ORDER — FENTANYL CITRATE (PF) 100 MCG/2ML IJ SOLN
INTRAMUSCULAR | Status: DC | PRN
  Administered 2014-06-18 (×2): 100 ug via INTRAVENOUS
  Administered 2014-06-18: 50 ug via INTRAVENOUS

## 2014-06-18 MED ORDER — GLYCOPYRROLATE 0.2 MG/ML IJ SOLN
INTRAMUSCULAR | Status: DC | PRN
  Administered 2014-06-18: .8 mg via INTRAVENOUS

## 2014-06-18 MED ORDER — CITRIC ACID-SODIUM CITRATE 334-500 MG/5ML PO SOLN
30.0000 mL | Freq: Once | ORAL | Status: AC
  Administered 2014-06-18: 30 mL via ORAL
  Filled 2014-06-18: qty 15

## 2014-06-18 MED ORDER — SUCCINYLCHOLINE CHLORIDE 20 MG/ML IJ SOLN
INTRAMUSCULAR | Status: AC
  Filled 2014-06-18: qty 10

## 2014-06-18 MED ORDER — PANTOPRAZOLE SODIUM 40 MG PO TBEC
40.0000 mg | DELAYED_RELEASE_TABLET | Freq: Every day | ORAL | Status: DC
  Administered 2014-06-19 – 2014-06-21 (×3): 40 mg via ORAL
  Filled 2014-06-18 (×3): qty 1

## 2014-06-18 MED ORDER — SODIUM CHLORIDE 0.9 % IV BOLUS (SEPSIS)
500.0000 mL | Freq: Once | INTRAVENOUS | Status: AC
  Administered 2014-06-18: 500 mL via INTRAVENOUS

## 2014-06-18 MED ORDER — HYDROMORPHONE HCL 1 MG/ML IJ SOLN
INTRAMUSCULAR | Status: AC
  Administered 2014-06-18: 0.5 mg via INTRAVENOUS
  Filled 2014-06-18: qty 1

## 2014-06-18 MED ORDER — GLYCOPYRROLATE 0.2 MG/ML IJ SOLN
INTRAMUSCULAR | Status: AC
  Filled 2014-06-18: qty 3

## 2014-06-18 MED ORDER — KETOROLAC TROMETHAMINE 30 MG/ML IJ SOLN
30.0000 mg | Freq: Once | INTRAMUSCULAR | Status: AC
  Administered 2014-06-18: 30 mg via INTRAVENOUS

## 2014-06-18 MED ORDER — MORPHINE SULFATE 4 MG/ML IJ SOLN
4.0000 mg | Freq: Once | INTRAMUSCULAR | Status: AC
Start: 2014-06-18 — End: 2014-06-18
  Administered 2014-06-18: 4 mg via INTRAVENOUS
  Filled 2014-06-18: qty 1

## 2014-06-18 MED ORDER — KETOROLAC TROMETHAMINE 30 MG/ML IJ SOLN
INTRAMUSCULAR | Status: AC
Start: 2014-06-18 — End: 2014-06-19
  Filled 2014-06-18: qty 1

## 2014-06-18 MED ORDER — LACTATED RINGERS IV SOLN
INTRAVENOUS | Status: DC | PRN
  Administered 2014-06-18 (×2): via INTRAVENOUS

## 2014-06-18 MED ORDER — FENTANYL CITRATE (PF) 250 MCG/5ML IJ SOLN
INTRAMUSCULAR | Status: AC
  Filled 2014-06-18: qty 5

## 2014-06-18 MED ORDER — KETOROLAC TROMETHAMINE 30 MG/ML IJ SOLN: 30.0000 mg | Freq: Four times a day (QID) | INTRAMUSCULAR | Status: DC

## 2014-06-18 MED ORDER — NEOSTIGMINE METHYLSULFATE 10 MG/10ML IV SOLN
INTRAVENOUS | Status: DC | PRN
  Administered 2014-06-18: 4 mg via INTRAVENOUS

## 2014-06-18 MED ORDER — KETOROLAC TROMETHAMINE 30 MG/ML IJ SOLN
30.0000 mg | Freq: Four times a day (QID) | INTRAMUSCULAR | Status: DC
  Administered 2014-06-19 – 2014-06-21 (×8): 30 mg via INTRAVENOUS
  Filled 2014-06-18 (×9): qty 1

## 2014-06-18 MED ORDER — HYDROMORPHONE HCL 1 MG/ML IJ SOLN
INTRAMUSCULAR | Status: AC
  Filled 2014-06-18: qty 1

## 2014-06-18 MED ORDER — ONDANSETRON HCL 4 MG/2ML IJ SOLN
4.0000 mg | Freq: Once | INTRAMUSCULAR | Status: AC
  Administered 2014-06-18: 4 mg via INTRAVENOUS
  Filled 2014-06-18: qty 2

## 2014-06-18 MED ORDER — HYDROMORPHONE HCL 1 MG/ML IJ SOLN
1.0000 mg | Freq: Once | INTRAMUSCULAR | Status: AC
  Administered 2014-06-18 (×2): 1 mg via INTRAVENOUS

## 2014-06-18 MED ORDER — ROCURONIUM BROMIDE 100 MG/10ML IV SOLN
INTRAVENOUS | Status: AC
Start: 2014-06-18 — End: 2014-06-18
  Filled 2014-06-18: qty 1

## 2014-06-18 MED ORDER — MEPERIDINE HCL 25 MG/ML IJ SOLN: 6.2500 mg | INTRAMUSCULAR | Status: DC | PRN

## 2014-06-18 MED ORDER — NALOXONE HCL 0.4 MG/ML IJ SOLN: 0.4000 mg | INTRAMUSCULAR | Status: DC | PRN

## 2014-06-18 MED ORDER — MIDAZOLAM HCL 5 MG/5ML IJ SOLN
INTRAMUSCULAR | Status: DC | PRN
  Administered 2014-06-18: 2 mg via INTRAVENOUS

## 2014-06-18 MED ORDER — SUCCINYLCHOLINE CHLORIDE 20 MG/ML IJ SOLN
INTRAMUSCULAR | Status: DC | PRN
  Administered 2014-06-18: 100 mg via INTRAVENOUS

## 2014-06-18 MED ORDER — LACTATED RINGERS IV SOLN
INTRAVENOUS | Status: DC
Start: 2014-06-18 — End: 2014-06-21
  Administered 2014-06-19: 01:00:00 via INTRAVENOUS

## 2014-06-18 MED ORDER — NEOSTIGMINE METHYLSULFATE 10 MG/10ML IV SOLN
INTRAVENOUS | Status: AC
  Filled 2014-06-18: qty 1

## 2014-06-18 MED ORDER — ONDANSETRON HCL 4 MG/2ML IJ SOLN
INTRAMUSCULAR | Status: DC | PRN
  Administered 2014-06-18: 4 mg via INTRAVENOUS

## 2014-06-18 MED ORDER — CEFAZOLIN SODIUM-DEXTROSE 2-3 GM-% IV SOLR
INTRAVENOUS | Status: AC
  Filled 2014-06-18: qty 50

## 2014-06-18 MED ORDER — DEXAMETHASONE SODIUM PHOSPHATE 10 MG/ML IJ SOLN
INTRAMUSCULAR | Status: DC | PRN
  Administered 2014-06-18: 4 mg via INTRAVENOUS

## 2014-06-18 MED ORDER — SODIUM CHLORIDE 0.9 % IJ SOLN: 9.0000 mL | INTRAMUSCULAR | Status: DC | PRN

## 2014-06-18 MED ORDER — DIPHENHYDRAMINE HCL 50 MG/ML IJ SOLN
12.5000 mg | Freq: Four times a day (QID) | INTRAMUSCULAR | Status: DC | PRN
  Administered 2014-06-19: 12.5 mg via INTRAVENOUS
  Filled 2014-06-18: qty 1

## 2014-06-18 MED ORDER — LACTATED RINGERS IV BOLUS (SEPSIS)
125.0000 mL | Freq: Once | INTRAVENOUS | Status: AC
Start: 2014-06-18 — End: 2014-06-18
  Administered 2014-06-18: 125 mL via INTRAVENOUS

## 2014-06-18 MED ORDER — ROCURONIUM BROMIDE 100 MG/10ML IV SOLN
INTRAVENOUS | Status: DC | PRN
  Administered 2014-06-18: 5 mg via INTRAVENOUS
  Administered 2014-06-18: 45 mg via INTRAVENOUS

## 2014-06-18 MED ORDER — DIPHENHYDRAMINE HCL 12.5 MG/5ML PO ELIX
12.5000 mg | ORAL_SOLUTION | Freq: Four times a day (QID) | ORAL | Status: DC | PRN
  Administered 2014-06-19 – 2014-06-21 (×3): 12.5 mg via ORAL
  Filled 2014-06-18 (×3): qty 5

## 2014-06-18 MED ORDER — FAMOTIDINE IN NACL 20-0.9 MG/50ML-% IV SOLN
20.0000 mg | Freq: Once | INTRAVENOUS | Status: AC
  Administered 2014-06-18: 20 mg via INTRAVENOUS
  Filled 2014-06-18: qty 50

## 2014-06-18 MED ORDER — ONDANSETRON HCL 4 MG PO TABS: 4.0000 mg | ORAL_TABLET | Freq: Four times a day (QID) | ORAL | Status: DC | PRN

## 2014-06-18 MED ORDER — HYDROMORPHONE 0.3 MG/ML IV SOLN
INTRAVENOUS | Status: DC
  Administered 2014-06-18: 23:00:00 via INTRAVENOUS
  Administered 2014-06-19: 2.4 mg via INTRAVENOUS
  Administered 2014-06-19: 1.5 mg via INTRAVENOUS
  Filled 2014-06-18: qty 25

## 2014-06-18 MED ORDER — ONDANSETRON HCL 4 MG/2ML IJ SOLN: 4.0000 mg | Freq: Four times a day (QID) | INTRAMUSCULAR | Status: DC | PRN

## 2014-06-18 MED ORDER — MIDAZOLAM HCL 2 MG/2ML IJ SOLN
INTRAMUSCULAR | Status: AC
  Filled 2014-06-18: qty 2

## 2014-06-18 MED ORDER — AZITHROMYCIN 250 MG PO TABS: 2000.0000 mg | ORAL_TABLET | Freq: Once | ORAL | Status: DC

## 2014-06-18 MED ORDER — PROPOFOL 10 MG/ML IV BOLUS
INTRAVENOUS | Status: AC
  Filled 2014-06-18: qty 20

## 2014-06-18 MED ORDER — MORPHINE SULFATE 4 MG/ML IJ SOLN
4.0000 mg | Freq: Once | INTRAMUSCULAR | Status: AC
  Administered 2014-06-18: 4 mg via INTRAVENOUS
  Filled 2014-06-18: qty 1

## 2014-06-18 MED ORDER — CEFAZOLIN SODIUM-DEXTROSE 2-3 GM-% IV SOLR
INTRAVENOUS | Status: DC | PRN
  Administered 2014-06-18: 2 g via INTRAVENOUS

## 2014-06-18 MED ORDER — METOCLOPRAMIDE HCL 5 MG/ML IJ SOLN: 10.0000 mg | Freq: Once | INTRAMUSCULAR | Status: DC | PRN

## 2014-06-18 MED ORDER — PROPOFOL 10 MG/ML IV BOLUS
INTRAVENOUS | Status: DC | PRN
  Administered 2014-06-18: 200 mg via INTRAVENOUS

## 2014-06-18 MED ORDER — HYDROMORPHONE HCL 1 MG/ML IJ SOLN
0.2500 mg | INTRAMUSCULAR | Status: DC | PRN
  Administered 2014-06-18 (×4): 0.5 mg via INTRAVENOUS

## 2014-06-18 MED ORDER — GENTAMICIN SULFATE 40 MG/ML IJ SOLN: 240.0000 mg | Freq: Once | INTRAMUSCULAR | Status: DC

## 2014-06-18 MED ORDER — LIDOCAINE HCL (CARDIAC) 20 MG/ML IV SOLN
INTRAVENOUS | Status: DC | PRN
  Administered 2014-06-18 (×2): 50 mg via INTRAVENOUS

## 2014-06-18 MED ORDER — ONDANSETRON HCL 4 MG/2ML IJ SOLN
INTRAMUSCULAR | Status: AC
  Filled 2014-06-18: qty 2

## 2014-06-18 MED ORDER — SIMETHICONE 80 MG PO CHEW: 80.0000 mg | CHEWABLE_TABLET | Freq: Four times a day (QID) | ORAL | Status: DC | PRN

## 2014-06-18 MED ORDER — GLYCOPYRROLATE 0.2 MG/ML IJ SOLN
INTRAMUSCULAR | Status: AC
  Filled 2014-06-18: qty 1

## 2014-06-18 SURGICAL SUPPLY — 16 items
DRSG OPSITE POSTOP 4X10 (GAUZE/BANDAGES/DRESSINGS) ×4 IMPLANT
GLOVE BIO SURGEON STRL SZ7.5 (GLOVE) ×8 IMPLANT
GLOVE BIO SURGEON STRL SZ8 (GLOVE) ×4 IMPLANT
GLOVE BIOGEL PI IND STRL 7.0 (GLOVE) ×6 IMPLANT
GLOVE BIOGEL PI INDICATOR 7.0 (GLOVE) ×6
GOWN SURGICAL XLG (GOWNS) ×12 IMPLANT
PACK C SECTION WH (CUSTOM PROCEDURE TRAY) ×4 IMPLANT
RTRCTR C-SECT PINK 25CM LRG (MISCELLANEOUS) ×4 IMPLANT
STAPLER VISISTAT 35W (STAPLE) ×4 IMPLANT
SUT MNCRL AB 4-0 PS2 18 (SUTURE) ×4 IMPLANT
SUT PLAIN 2 0 XLH (SUTURE) ×4 IMPLANT
SUT VIC AB 0 CT1 27 (SUTURE) ×4
SUT VIC AB 0 CT1 27XBRD ANBCTR (SUTURE) ×4 IMPLANT
SUT VIC AB 0 CT1 36 (SUTURE) ×12 IMPLANT
SUT VIC AB 0 CTX 36 (SUTURE) ×4
SUT VIC AB 0 CTX36XBRD ANBCTRL (SUTURE) ×4 IMPLANT

## 2014-06-18 NOTE — Anesthesia Preprocedure Evaluation (Signed)
Anesthesia Evaluation  Patient identified by MRN, date of birth, ID band Patient awake    Reviewed: Allergy & Precautions, NPO status , Patient's Chart, lab work & pertinent test results  Airway Mallampati: II  TM Distance: >3 FB Neck ROM: Full    Dental  (+) Missing, Chipped,    Pulmonary asthma ,  breath sounds clear to auscultation  Pulmonary exam normal       Cardiovascular negative cardio ROS Normal cardiovascular examRhythm:Regular Rate:Normal     Neuro/Psych negative neurological ROS  negative psych ROS   GI/Hepatic negative GI ROS, Neg liver ROS,   Endo/Other  Morbid obesityPCOS  Renal/GU negative Renal ROS  negative genitourinary   Musculoskeletal negative musculoskeletal ROS (+)   Abdominal (+) + obese,   Peds  Hematology   Anesthesia Other Findings   Reproductive/Obstetrics Ectopic pregnancy                             Anesthesia Physical Anesthesia Plan  ASA: III and emergent  Anesthesia Plan: General   Post-op Pain Management:    Induction: Intravenous, Rapid sequence and Cricoid pressure planned  Airway Management Planned: Oral ETT  Additional Equipment:   Intra-op Plan:   Post-operative Plan: Extubation in OR  Informed Consent: I have reviewed the patients History and Physical, chart, labs and discussed the procedure including the risks, benefits and alternatives for the proposed anesthesia with the patient or authorized representative who has indicated his/her understanding and acceptance.   Dental advisory given  Plan Discussed with: CRNA, Anesthesiologist and Surgeon  Anesthesia Plan Comments:         Anesthesia Quick Evaluation

## 2014-06-18 NOTE — Anesthesia Postprocedure Evaluation (Signed)
  Anesthesia Post-op Note  Patient: Kaitlyn Walton  Procedure(s) Performed: Procedure(s): LAPAROTOMY with removal of Left ectopic pregnancy  Patient Location: PACU  Anesthesia Type: General  Level of Consciousness: awake and alert   Airway and Oxygen Therapy: Patient Spontanous Breathing  Post-op Pain: mild  Post-op Assessment: Post-op Vital signs reviewed, Patient's Cardiovascular Status Stable, Respiratory Function Stable, Patent Airway and No signs of Nausea or vomiting  Last Vitals:  Filed Vitals:   06/18/14 2327  BP:   Pulse:   Temp:   Resp: 18    Post-op Vital Signs: stable   Complications: No apparent anesthesia complications

## 2014-06-18 NOTE — H&P (Signed)
 @  Kaitlyn Walton is an 29 y.o. female. Patient transferred from [facility] with ruptured left ectopic tubal pregnancy.  Ultrasound revealed an 8 cm mass in left adnexa with a moderate amount of free fluid.  Quantitative beta HCG ~ 12, 000.  Patient is clinically stable.  Pertinent Gynecological History: Menses: flow is moderate Bleeding: no intermenstrual bleeding Contraception: IUD DES exposure: denies Blood transfusions: none Sexually transmitted diseases: no past history Previous GYN Procedures: none  Last mammogram: n/a Date: n/a Last pap: normal Date: 2014 OB History: G5, P3   Menstrual History:  Patient's last menstrual period was 05/24/2014.    Past Medical History  Diagnosis Date  . Asthma   . PCOS (polycystic ovarian syndrome)   . Obesity     Past Surgical History  Procedure Laterality Date  . Mouth surgery    . Lung surgery      Family History  Problem Relation Age of Onset  . Diabetes Mother   . Cancer Maternal Aunt   . Hypertension Maternal Grandmother   . Hypertension Maternal Grandfather   . Cancer Maternal Grandfather   . Hypertension Paternal Grandmother   . Hypertension Paternal Grandfather   . Asthma Other   . GI problems Other   . Hyperlipidemia Other   . Obesity Other   . Stroke Other   . Sleep apnea Other     Social History:  reports that she has never smoked. She has never used smokeless tobacco. She reports that she does not drink alcohol or use illicit drugs.  Allergies:  Allergies  Allergen Reactions  . Cefaclor Other (See Comments)    "ceclor"= childhood allergy  . Sulfonamide Derivatives Other (See Comments)    Sulfa= childhood allergy    Prescriptions prior to admission  Medication Sig Dispense Refill Last Dose  . albuterol (PROVENTIL HFA;VENTOLIN HFA) 108 (90 BASE) MCG/ACT inhaler Inhale 2 puffs into the lungs every 6 (six) hours as needed for wheezing. For wheezing 18 g 2 Past Week at Unknown time  .  albuterol (PROVENTIL) (2.5 MG/3ML) 0.083% nebulizer solution Take 2.5 mg by nebulization every 6 (six) hours as needed for wheezing or shortness of breath.   06/17/2014 at Unknown time  . Aspirin-Salicylamide-Caffeine (BC HEADACHE POWDER PO) Take 1 packet by mouth daily as needed (pain).   06/17/2014 at Unknown time  . ibuprofen (ADVIL,MOTRIN) 600 MG tablet Take 1 tablet (600 mg total) by mouth every 6 (six) hours as needed for pain. (Patient not taking: Reported on 06/18/2014) 30 tablet 0 Not Taking at Unknown time  . metFORMIN (GLUCOPHAGE XR) 500 MG 24 hr tablet Take 3 tablets (  ) po daily with supper. (Patient not taking: Reported on 06/18/2014) 90 tablet 11 Not Taking at Unknown time  . norgestimate-ethinyl estradiol (ORTHO-CYCLEN,SPRINTEC,PREVIFEM) 0.25-35 MG-MCG tablet Take 1 tablet by mouth daily. (Patient not taking: Reported on 06/18/2014) 1 Package 11 Not Taking at Unknown time    REVIEW OF SYSTEMS: A ROS was performed and pertinent positives and negatives are included in the history.  GENERAL: No fevers or chills. HEENT: No change in vision, no earache, sore throat or sinus congestion. NECK: No pain or stiffness. CARDIOVASCULAR: No chest pain or pressure. No palpitations. PULMONARY: No shortness of breath, cough or wheeze. GASTROINTESTINAL: No abdominal pain, nausea, vomiting or diarrhea, melena or bright red blood per rectum. GENITOURINARY: No urinary frequency, urgency, hesitancy or dysuria. MUSCULOSKELETAL: No joint or muscle pain, no back pain, no recent trauma. DERMATOLOGIC: No rash, no itching,  no lesions. ENDOCRINE: No polyuria, polydipsia, no heat or cold intolerance. No recent change in weight. HEMATOLOGICAL: No anemia or easy bruising or bleeding. NEUROLOGIC: No headache, seizures, numbness, tingling or weakness. PSYCHIATRIC: No depression, no loss of interest in normal activity or change in sleep pattern.     Blood pressure 132/76, pulse 84, temperature 98.1 F (36.7 C),  temperature source Oral, resp. rate 18, last menstrual period 05/24/2014, SpO2 98 %.  Physical Exam:  HEENT:unremarkable Neck:Supple, midline, no thyroid megaly, no carotid bruits Lungs:  Clear to auscultation no rhonchi's or wheezes Heart:Regular rate and rhythm, no murmurs or gallops Breast Exam:not done Abdomen:soft, non distended.  Tender Pelvic:BUS normal Vagina:no lesions Cervix: parous, no lesions Uterus: not appreciated due to obesity Adnexa: tender left adnexa Extremities: No cords, no edema   Results for orders placed or performed during the hospital encounter of 06/18/14 (from the past 24 hour(s))  CBC with Differential     Status: Abnormal   Collection Time: 06/18/14 10:28 AM  Result Value Ref Range   WBC 10.3 4.0 - 10.5 K/uL   RBC 4.16 3.87 - 5.11 MIL/uL   Hemoglobin 11.8 (L) 12.0 - 15.0 g/dL   HCT 16.136.9 09.636.0 - 04.546.0 %   MCV 88.7 78.0 - 100.0 fL   MCH 28.4 26.0 - 34.0 pg   MCHC 32.0 30.0 - 36.0 g/dL   RDW 40.913.0 81.111.5 - 91.415.5 %   Platelets 340 150 - 400 K/uL   Neutrophils Relative % 57 43 - 77 %   Neutro Abs 5.8 1.7 - 7.7 K/uL   Lymphocytes Relative 29 12 - 46 %   Lymphs Abs 3.0 0.7 - 4.0 K/uL   Monocytes Relative 6 3 - 12 %   Monocytes Absolute 0.6 0.1 - 1.0 K/uL   Eosinophils Relative 7 (H) 0 - 5 %   Eosinophils Absolute 0.8 (H) 0.0 - 0.7 K/uL   Basophils Relative 1 0 - 1 %   Basophils Absolute 0.1 0.0 - 0.1 K/uL  Comprehensive metabolic panel     Status: Abnormal   Collection Time: 06/18/14 10:28 AM  Result Value Ref Range   Sodium 139 135 - 145 mmol/L   Potassium 4.0 3.5 - 5.1 mmol/L   Chloride 106 101 - 111 mmol/L   CO2 27 22 - 32 mmol/L   Glucose, Bld 133 (H) 65 - 99 mg/dL   BUN 13 6 - 20 mg/dL   Creatinine, Ser 7.820.61 0.44 - 1.00 mg/dL   Calcium 8.8 (L) 8.9 - 10.3 mg/dL   Total Protein 6.9 6.5 - 8.1 g/dL   Albumin 3.8 3.5 - 5.0 g/dL   AST 20 15 - 41 U/L   ALT 30 14 - 54 U/L   Alkaline Phosphatase 55 38 - 126 U/L   Total Bilirubin 0.5 0.3 - 1.2  mg/dL   GFR calc non Af Amer >60 >60 mL/min   GFR calc Af Amer >60 >60 mL/min   Anion gap 6 5 - 15  Lipase, blood     Status: None   Collection Time: 06/18/14 10:28 AM  Result Value Ref Range   Lipase 23 22 - 51 U/L  Urinalysis, Routine w reflex microscopic     Status: Abnormal   Collection Time: 06/18/14 12:00 PM  Result Value Ref Range   Color, Urine YELLOW YELLOW   APPearance CLOUDY (A) CLEAR   Specific Gravity, Urine 1.020 1.005 - 1.030   pH 8.5 (H) 5.0 - 8.0   Glucose, UA NEGATIVE NEGATIVE  mg/dL   Hgb urine dipstick LARGE (A) NEGATIVE   Bilirubin Urine NEGATIVE NEGATIVE   Ketones, ur NEGATIVE NEGATIVE mg/dL   Protein, ur NEGATIVE NEGATIVE mg/dL   Urobilinogen, UA 0.2 0.0 - 1.0 mg/dL   Nitrite NEGATIVE NEGATIVE   Leukocytes, UA NEGATIVE NEGATIVE  Urine microscopic-add on     Status: Abnormal   Collection Time: 06/18/14 12:00 PM  Result Value Ref Range   Squamous Epithelial / LPF RARE RARE   WBC, UA 0-2 <3 WBC/hpf   RBC / HPF 11-20 <3 RBC/hpf   Bacteria, UA MANY (A) RARE   Urine-Other AMORPHOUS URATES/PHOSPHATES   Wet prep, genital     Status: Abnormal   Collection Time: 06/18/14 12:11 PM  Result Value Ref Range   Yeast Wet Prep HPF POC NONE SEEN NONE SEEN   Trich, Wet Prep NONE SEEN NONE SEEN   Clue Cells Wet Prep HPF POC NONE SEEN NONE SEEN   WBC, Wet Prep HPF POC MODERATE (A) NONE SEEN  POC urine preg, ED (not at [facility])     Status: Abnormal   Collection Time: 06/18/14 12:15 PM  Result Value Ref Range   Preg Test, Ur POSITIVE (A) NEGATIVE  hCG, quantitative, pregnancy     Status: Abnormal   Collection Time: 06/18/14 12:34 PM  Result Value Ref Range   hCG, Beta Chain, Quant, S 12312 (H) <5 mIU/mL  ABO/Rh     Status: None   Collection Time: 06/18/14 12:34 PM  Result Value Ref Range   ABO/RH(D) O POS     US Ob Comp Less 14 Wks  06/18/2014   CLINICAL DATA:  Pelvic tenderness, new discovery of pregnancy, evaluate the presence of a IUD as well as acute ovarian  pathology  EXAM: OBSTETRIC <14 WK [place] AND TRANSVAGINAL OB  US DOPPLER ULTRASOUND OF OVARIES  TECHNIQUE: Both transabdominal and transvaginal ultrasound examinations were performed for complete evaluation of the gestation as well as the maternal uterus, adnexal regions, and pelvic cul-de-sac. Transvaginal technique was performed to assess early pregnancy.  Color and duplex Doppler ultrasound was utilized to evaluate blood flow to the ovaries.  COMPARISON:  None.  FINDINGS: Intrauterine gestational sac: Not visualized  Yolk sac:  Not visualized  Embryo:  Not visualized  Cardiac Activity: Not visualized  Heart Rate: n/a bpm  Maternal uterus/adnexae:  Uterus: There is no endometrial fluid collection demonstrated. The endometrial stripe measures at least 12 mm.  The right ovary measures 2.6 x 3.1 x 1.8 cm and contains a cystic structure measuring 6 x 8 x 9 mm. Its rim is slightly hyperechoic ; it does not exhibit increased vascularity. Vascularity of the right ovary is normal.  The left ovary measures 3.7 x 2.2 x 2.9 cm and demonstrates normal vascularity. Associated with the left ovary in the left adnexal region is a complex appearing structure measuring 8.2 x 4.4 x 4.5 cm. It does not appear markedly hyper vascular but the patient is quite tender here.  IMPRESSION: 1. Complex left adnexal mass worrisome for tubo-ovarian abscess. But given the strongly positive beta HCG, ectopic pregnancy with surrounding hemorrhage may be responsible for the findings. 2. Normal appearing ovarian tissue is visible bilaterally with normal vascularity. There is no evidence of ovarian torsion. 3. No IUP is demonstrated.  No IUD is demonstrated either. 4. There is a moderate amount of free pelvic fluid. 5. These results were called by telephone at the time of interpretation on 06/18/2014 at 2:50 pm to Dr. France Ravens CAMPRUBI-SOMS ,  who verbally acknowledged these results.   Electronically Signed   By: David  SwazilandJordan M.D.   On: 06/18/2014 14:50    [place] Ob Transvaginal  06/18/2014   CLINICAL DATA:  Pelvic tenderness, new discovery of pregnancy, evaluate the presence of a IUD as well as acute ovarian pathology  EXAM: OBSTETRIC <14 WK US AND TRANSVAGINAL OB  US DOPPLER ULTRASOUND OF OVARIES  TECHNIQUE: Both transabdominal and transvaginal ultrasound examinations were performed for complete evaluation of the gestation as well as the maternal uterus, adnexal regions, and pelvic cul-de-sac. Transvaginal technique was performed to assess early pregnancy.  Color and duplex Doppler ultrasound was utilized to evaluate blood flow to the ovaries.  COMPARISON:  None.  FINDINGS: Intrauterine gestational sac: Not visualized  Yolk sac:  Not visualized  Embryo:  Not visualized  Cardiac Activity: Not visualized  Heart Rate: n/a bpm  Maternal uterus/adnexae:  Uterus: There is no endometrial fluid collection demonstrated. The endometrial stripe measures at least 12 mm.  The right ovary measures 2.6 x 3.1 x 1.8 cm and contains a cystic structure measuring 6 x 8 x 9 mm. Its rim is slightly hyperechoic ; it does not exhibit increased vascularity. Vascularity of the right ovary is normal.  The left ovary measures 3.7 x 2.2 x 2.9 cm and demonstrates normal vascularity. Associated with the left ovary in the left adnexal region is a complex appearing structure measuring 8.2 x 4.4 x 4.5 cm. It does not appear markedly hyper vascular but the patient is quite tender here.  IMPRESSION: 1. Complex left adnexal mass worrisome for tubo-ovarian abscess. But given the strongly positive beta HCG, ectopic pregnancy with surrounding hemorrhage may be responsible for the findings. 2. Normal appearing ovarian tissue is visible bilaterally with normal vascularity. There is no evidence of ovarian torsion. 3. No IUP is demonstrated.  No IUD is demonstrated either. 4. There is a moderate amount of free pelvic fluid. 5. These results were called by telephone at the time of interpretation on 06/18/2014  at 2:50 pm to Dr. France RavensMERCEDES CAMPRUBI-SOMS , who verbally acknowledged these results.   Electronically Signed   By: David  [place]Jordan M.D.   On: 06/18/2014 14:50   [place] Art/ven Flow Abd Pelv Doppler  06/18/2014   CLINICAL DATA:  Pelvic tenderness, new discovery of pregnancy, evaluate the presence of a IUD as well as acute ovarian pathology  EXAM: OBSTETRIC <14 WK US AND TRANSVAGINAL OB  US DOPPLER ULTRASOUND OF OVARIES  TECHNIQUE: Both transabdominal and transvaginal ultrasound examinations were performed for complete evaluation of the gestation as well as the maternal uterus, adnexal regions, and pelvic cul-de-sac. Transvaginal technique was performed to assess early pregnancy.  Color and duplex Doppler ultrasound was utilized to evaluate blood flow to the ovaries.  COMPARISON:  None.  FINDINGS: Intrauterine gestational sac: Not visualized  Yolk sac:  Not visualized  Embryo:  Not visualized  Cardiac Activity: Not visualized  Heart Rate: n/a bpm  Maternal uterus/adnexae:  Uterus: There is no endometrial fluid collection demonstrated. The endometrial stripe measures at least 12 mm.  The right ovary measures 2.6 x 3.1 x 1.8 cm and contains a cystic structure measuring 6 x 8 x 9 mm. Its rim is slightly hyperechoic ; it does not exhibit increased vascularity. Vascularity of the right ovary is normal.  The left ovary measures 3.7 x 2.2 x 2.9 cm and demonstrates normal vascularity. Associated with the left ovary in the left adnexal region is a complex appearing structure measuring 8.2 x 4.4  x 4.5 cm. It does not appear markedly hyper vascular but the patient is quite tender here.  IMPRESSION: 1. Complex left adnexal mass worrisome for tubo-ovarian abscess. But given the strongly positive beta HCG, ectopic pregnancy with surrounding hemorrhage may be responsible for the findings. 2. Normal appearing ovarian tissue is visible bilaterally with normal vascularity. There is no evidence of ovarian torsion. 3. No IUP is demonstrated.   No IUD is demonstrated either. 4. There is a moderate amount of free pelvic fluid. 5. These results were called by telephone at the time of interpretation on 06/18/2014 at 2:50 pm to Dr. France Ravens CAMPRUBI-SOMS , who verbally acknowledged these results.   Electronically Signed   By: David  [place] M.D.   On: 06/18/2014 14:50    Assessment/Plan: Left, tubal ectopic pregnancy, ruptured with moderate amount of free fluid on ultrasound.  Hemodynamically stable.  Will proceed with exploratory laparotomy and removal of ectopic pregnancy.  HARPER,CHARLES A 06/18/2014, 6:55 PM

## 2014-06-18 NOTE — ED Provider Notes (Signed)
CSN: [id number]     Arrival date & time 06/18/14  0945 History   First MD Initiated Contact with Patient 06/18/14 [phone]     Chief Complaint  Patient presents with  . Abdominal Pain     (Consider location/radiation/quality/duration/timing/severity/associated sxs/prior Treatment) HPI Comments: Kaitlyn Walton is a 29 y.o. female with a PMHx of PCOS, asthma, and obesity, who presents to the ED with complaints of gradual onset abdominal pain 2 weeks which has worsened over the last few days. She states that this pain feels similar to her prior PCOS-related pain therefore she made an appt with Dr. Clearance Coots her OBGYN for tomorrow morning, but the pain was more severe today and she didn't feel she could wait any longer. She rates her pain as 8/10 sharp intermittent pain which is nonradiating from the LUQ/epigastrum and lower abdomen, worse with movement, improved slightly with BC powders and ibuprofen. She states she doesn't have the pain daily, but today it has been more constant. Associated symptoms include nausea. Additionally she states that she's been on her menstrual cycle for the last 3.5wks (since 05/24/14). She states that she has very irregular menses, and reports that her vaginal bleeding is currently bright red, without clots, using 2-3 pads/day without soaking through, and is consistent with her typical menses. Denies fevers, chills, CP, SOB, vomiting, diarrhea, constipation, melena, hematochezia, rectal pain, vaginal pain/discharge, hematuria, dysuria, flank/back pain, myalgias, arthralgias, numbness, weakness, paresthesias, or rashes. Denies recent travel, sick contacts, abx use, frequent NSAIDs, EtOH use, or suspicious food intake. Denies any prior abd surgeries. Sexually active with 1 female partner unprotected.   Patient is a 29 y.o. female presenting with abdominal pain. The history is provided by the patient. No language interpreter was used.  Abdominal Pain Pain location:  LUQ, LLQ, RLQ  and suprapubic Pain quality: sharp   Pain radiates to:  Does not radiate Pain severity:  Moderate Onset quality:  Gradual Duration:  2 weeks Timing:  Intermittent Progression:  Worsening Chronicity:  Recurrent Context: not alcohol use, not recent illness, not recent travel, not sick contacts and not suspicious food intake   Relieved by:  NSAIDs Worsened by:  Movement Ineffective treatments:  None tried Associated symptoms: nausea and vaginal bleeding   Associated symptoms: no chest pain, no chills, no constipation, no diarrhea, no dysuria, no fever, no flatus, no hematemesis, no hematochezia, no hematuria, no melena, no shortness of breath, no vaginal discharge and no vomiting   Risk factors: obesity     Past Medical History  Diagnosis Date  . Asthma   . PCOS (polycystic ovarian syndrome)   . Obesity    Past Surgical History  Procedure Laterality Date  . Mouth surgery    . Lung surgery     Family History  Problem Relation Age of Onset  . Diabetes Mother   . Cancer Maternal Aunt   . Hypertension Maternal Grandmother   . Hypertension Maternal Grandfather   . Cancer Maternal Grandfather   . Hypertension Paternal Grandmother   . Hypertension Paternal Grandfather   . Asthma Other   . GI problems Other   . Hyperlipidemia Other   . Obesity Other   . Stroke Other   . Sleep apnea Other    History  Substance Use Topics  . Smoking status: Never Smoker   . Smokeless tobacco: Never Used  . Alcohol Use: No   OB History    Gravida Para Term Preterm AB TAB SAB Ectopic Multiple Living   4 3  3 0 1 0 1 0 0 3     Review of Systems  Constitutional: Negative for fever and chills.  Respiratory: Negative for shortness of breath.   Cardiovascular: Negative for chest pain.  Gastrointestinal: Positive for nausea and abdominal pain. Negative for vomiting, diarrhea, constipation, blood in stool, melena, hematochezia, rectal pain, flatus and hematemesis.  Genitourinary: Positive for  vaginal bleeding and menstrual problem (very irregular, ongoing since 05/24/14). Negative for dysuria, hematuria, flank pain, vaginal discharge and vaginal pain.  Musculoskeletal: Negative for myalgias, back pain and arthralgias.  Skin: Negative for color change.  Allergic/Immunologic: Negative for immunocompromised state.  Neurological: Negative for weakness and numbness.  Psychiatric/Behavioral: Negative for confusion.   10 Systems reviewed and are negative for acute change except as noted in the HPI.    Allergies  Cefaclor and Sulfonamide derivatives  Home Medications   Prior to Admission medications   Medication Sig Start Date End Date Taking? Authorizing Provider  albuterol (PROVENTIL HFA;VENTOLIN HFA) 108 (90 BASE) MCG/ACT inhaler Inhale 2 puffs into the lungs every 6 (six) hours as needed for wheezing. For wheezing 05/23/12   Reva Boresanya S Pratt, MD  ibuprofen (ADVIL,MOTRIN) 600 MG tablet Take 1 tablet (600 mg total) by mouth every 6 (six) hours as needed for pain. 09/18/12   Latrelle DodrillBrittany J McIntyre, MD  metFORMIN (GLUCOPHAGE XR) 500 MG 24 hr tablet Take 3 tablets ( 1500mg  ) po daily with supper. 07/26/13   Brock Badharles A Harper, MD  norgestimate-ethinyl estradiol (ORTHO-CYCLEN,SPRINTEC,PREVIFEM) 0.25-35 MG-MCG tablet Take 1 tablet by mouth daily. 06/11/13   Brock Badharles A Harper, MD   BP 138/119 mmHg  Pulse 104  Temp(Src) 98.1 F (36.7 C) (Oral)  Resp 17  SpO2 99%  LMP 06/18/2014 Physical Exam  Constitutional: She is oriented to person, place, and time. She appears well-developed and well-nourished.  Non-toxic appearance. She appears distressed.  Afebrile, nontoxic, appears uncomfortable. Mildly tachycardic on triage vitals which resolved during exam  HENT:  Head: Normocephalic and atraumatic.  Mouth/Throat: Oropharynx is clear and moist and mucous membranes are normal.  Eyes: Conjunctivae and EOM are normal. Right eye exhibits no discharge. Left eye exhibits no discharge.  Neck: Normal range of  motion. Neck supple.  Cardiovascular: Normal rate, regular rhythm, normal heart sounds and intact distal pulses.  Exam reveals no gallop and no friction rub.   No murmur heard. Pulmonary/Chest: Effort normal and breath sounds normal. No respiratory distress. She has no decreased breath sounds. She has no wheezes. She has no rhonchi. She has no rales.  Abdominal: Soft. Normal appearance and bowel sounds are normal. She exhibits no distension. There is tenderness in the right lower quadrant, suprapubic area, left upper quadrant and left lower quadrant. There is no rigidity, no rebound, no guarding, no CVA tenderness, no tenderness at McBurney's point and negative Murphy's sign.    Soft, morbidly obese but nondistended, +BS throughout, with diffuse lower abd TTP and some LUQ TTP, no r/g/r, neg murphy's, neg mcburney's, no CVA TTP   Genitourinary: Pelvic exam was performed with patient supine. There is no rash, tenderness or lesion on the right labia. There is no rash, tenderness or lesion on the left labia. Uterus is tender. Uterus is not deviated and not fixed. Cervix exhibits motion tenderness, discharge and friability. Right adnexum displays no mass, no tenderness and no fullness. Left adnexum displays tenderness. Left adnexum displays no mass and no fullness. There is bleeding in the vagina. No erythema or tenderness in the vagina. Vaginal discharge found.  Chaperone present for exam. No rashes, lesions, or tenderness to external genitalia. No erythema, injury, or tenderness to vaginal mucosa. Some old blood and mucoid vaginal discharge within vaginal vault. No adnexal masses or fullness but L adnexal tender to palpation. +CMT, +cervical friability, and mucoid discharge from cervical os which is closed. Uterus non-deviated, mobile, but diffusely TTP, and body habitus limits evaluation of size therefore unable to determine if enlarged or not.   Musculoskeletal: Normal range of motion.  Neurological: She  is alert and oriented to person, place, and time. She has normal strength. No sensory deficit.  Skin: Skin is warm, dry and intact. No rash noted.  Psychiatric: She has a normal mood and affect.  Nursing note and vitals reviewed.   ED Course  Procedures (including critical care time) Labs Review Labs Reviewed  WET PREP, GENITAL - Abnormal; Notable for the following:    WBC, Wet Prep HPF POC MODERATE (*)    All other components within normal limits  CBC WITH DIFFERENTIAL/PLATELET - Abnormal; Notable for the following:    Hemoglobin 11.8 (*)    Eosinophils Relative 7 (*)    Eosinophils Absolute 0.8 (*)    All other components within normal limits  COMPREHENSIVE METABOLIC PANEL - Abnormal; Notable for the following:    Glucose, Bld 133 (*)    Calcium 8.8 (*)    All other components within normal limits  URINALYSIS, ROUTINE W REFLEX MICROSCOPIC - Abnormal; Notable for the following:    APPearance CLOUDY (*)    pH 8.5 (*)    Hgb urine dipstick LARGE (*)    All other components within normal limits  HCG, QUANTITATIVE, PREGNANCY - Abnormal; Notable for the following:    hCG, Beta Chain, Quant, S 12312 (*)    All other components within normal limits  URINE MICROSCOPIC-ADD ON - Abnormal; Notable for the following:    Bacteria, UA MANY (*)    All other components within normal limits  POC URINE PREG, ED - Abnormal; Notable for the following:    Preg Test, Ur POSITIVE (*)    All other components within normal limits  URINE CULTURE  LIPASE, BLOOD  RPR  HIV ANTIBODY (ROUTINE TESTING)  ABO/RH  GC/CHLAMYDIA PROBE AMP ([place])    Imaging Review US Ob Comp Less 14 Wks  06/18/2014   CLINICAL DATA:  Pelvic tenderness, new discovery of pregnancy, evaluate the presence of a IUD as well as acute ovarian pathology  EXAM: OBSTETRIC <14 WK [place] AND TRANSVAGINAL OB  US DOPPLER ULTRASOUND OF OVARIES  TECHNIQUE: Both transabdominal and transvaginal ultrasound examinations were performed for  complete evaluation of the gestation as well as the maternal uterus, adnexal regions, and pelvic cul-de-sac. Transvaginal technique was performed to assess early pregnancy.  Color and duplex Doppler ultrasound was utilized to evaluate blood flow to the ovaries.  COMPARISON:  None.  FINDINGS: Intrauterine gestational sac: Not visualized  Yolk sac:  Not visualized  Embryo:  Not visualized  Cardiac Activity: Not visualized  Heart Rate: n/a bpm  Maternal uterus/adnexae:  Uterus: There is no endometrial fluid collection demonstrated. The endometrial stripe measures at least 12 mm.  The right ovary measures 2.6 x 3.1 x 1.8 cm and contains a cystic structure measuring 6 x 8 x 9 mm. Its rim is slightly hyperechoic ; it does not exhibit increased vascularity. Vascularity of the right ovary is normal.  The left ovary measures 3.7 x 2.2 x 2.9 cm and demonstrates normal vascularity. Associated with the  left ovary in the left adnexal region is a complex appearing structure measuring 8.2 x 4.4 x 4.5 cm. It does not appear markedly hyper vascular but the patient is quite tender here.  IMPRESSION: 1. Complex left adnexal mass worrisome for tubo-ovarian abscess. But given the strongly positive beta HCG, ectopic pregnancy with surrounding hemorrhage may be responsible for the findings. 2. Normal appearing ovarian tissue is visible bilaterally with normal vascularity. There is no evidence of ovarian torsion. 3. No IUP is demonstrated.  No IUD is demonstrated either. 4. There is a moderate amount of free pelvic fluid. 5. These results were called by telephone at the time of interpretation on 06/18/2014 at 2:50 pm to Dr. France Ravens CAMPRUBI-SOMS , who verbally acknowledged these results.   Electronically Signed   By: David  [place] M.D.   On: 06/18/2014 14:50   US Ob Transvaginal  06/18/2014   CLINICAL DATA:  Pelvic tenderness, new discovery of pregnancy, evaluate the presence of a IUD as well as acute ovarian pathology  EXAM: OBSTETRIC  <14 WK [place] AND TRANSVAGINAL OB  US DOPPLER ULTRASOUND OF OVARIES  TECHNIQUE: Both transabdominal and transvaginal ultrasound examinations were performed for complete evaluation of the gestation as well as the maternal uterus, adnexal regions, and pelvic cul-de-sac. Transvaginal technique was performed to assess early pregnancy.  Color and duplex Doppler ultrasound was utilized to evaluate blood flow to the ovaries.  COMPARISON:  None.  FINDINGS: Intrauterine gestational sac: Not visualized  Yolk sac:  Not visualized  Embryo:  Not visualized  Cardiac Activity: Not visualized  Heart Rate: n/a bpm  Maternal uterus/adnexae:  Uterus: There is no endometrial fluid collection demonstrated. The endometrial stripe measures at least 12 mm.  The right ovary measures 2.6 x 3.1 x 1.8 cm and contains a cystic structure measuring 6 x 8 x 9 mm. Its rim is slightly hyperechoic ; it does not exhibit increased vascularity. Vascularity of the right ovary is normal.  The left ovary measures 3.7 x 2.2 x 2.9 cm and demonstrates normal vascularity. Associated with the left ovary in the left adnexal region is a complex appearing structure measuring 8.2 x 4.4 x 4.5 cm. It does not appear markedly hyper vascular but the patient is quite tender here.  IMPRESSION: 1. Complex left adnexal mass worrisome for tubo-ovarian abscess. But given the strongly positive beta HCG, ectopic pregnancy with surrounding hemorrhage may be responsible for the findings. 2. Normal appearing ovarian tissue is visible bilaterally with normal vascularity. There is no evidence of ovarian torsion. 3. No IUP is demonstrated.  No IUD is demonstrated either. 4. There is a moderate amount of free pelvic fluid. 5. These results were called by telephone at the time of interpretation on 06/18/2014 at 2:50 pm to Dr. France Ravens CAMPRUBI-SOMS , who verbally acknowledged these results.   Electronically Signed   By: David  [place] M.D.   On: 06/18/2014 14:50   [place] Art/ven Flow Abd Pelv  Doppler  06/18/2014   CLINICAL DATA:  Pelvic tenderness, new discovery of pregnancy, evaluate the presence of a IUD as well as acute ovarian pathology  EXAM: OBSTETRIC <14 WK [place] AND TRANSVAGINAL OB  US DOPPLER ULTRASOUND OF OVARIES  TECHNIQUE: Both transabdominal and transvaginal ultrasound examinations were performed for complete evaluation of the gestation as well as the maternal uterus, adnexal regions, and pelvic cul-de-sac. Transvaginal technique was performed to assess early pregnancy.  Color and duplex Doppler ultrasound was utilized to evaluate blood flow to the ovaries.  COMPARISON:  None.  FINDINGS: Intrauterine gestational sac: Not visualized  Yolk sac:  Not visualized  Embryo:  Not visualized  Cardiac Activity: Not visualized  Heart Rate: n/a bpm  Maternal uterus/adnexae:  Uterus: There is no endometrial fluid collection demonstrated. The endometrial stripe measures at least 12 mm.  The right ovary measures 2.6 x 3.1 x 1.8 cm and contains a cystic structure measuring 6 x 8 x 9 mm. Its rim is slightly hyperechoic ; it does not exhibit increased vascularity. Vascularity of the right ovary is normal.  The left ovary measures 3.7 x 2.2 x 2.9 cm and demonstrates normal vascularity. Associated with the left ovary in the left adnexal region is a complex appearing structure measuring 8.2 x 4.4 x 4.5 cm. It does not appear markedly hyper vascular but the patient is quite tender here.  IMPRESSION: 1. Complex left adnexal mass worrisome for tubo-ovarian abscess. But given the strongly positive beta HCG, ectopic pregnancy with surrounding hemorrhage may be responsible for the findings. 2. Normal appearing ovarian tissue is visible bilaterally with normal vascularity. There is no evidence of ovarian torsion. 3. No IUP is demonstrated.  No IUD is demonstrated either. 4. There is a moderate amount of free pelvic fluid. 5. These results were called by telephone at the time of interpretation on 06/18/2014 at 2:50 pm to  Dr. France RavensMERCEDES CAMPRUBI-SOMS , who verbally acknowledged these results.   Electronically Signed   By: David  [place]and[place] M.D.   On: 06/18/2014 14:50     EKG Interpretation None      MDM   Final diagnoses:  Pelvic pain in female  Ectopic pregnancy  Vaginal bleeding    29 y.o. female here with 2wks of abd pain, mostly in epigastrum/LUQ and lower abdomen. Has hx of PCOS and thinks this feels similar. Has been having ongoing vaginal bleeding x3.5wks. Has appt with OBGYN tomorrow but felt pain was worsening and couldn't bear it. On exam, pain diffusely in lower abdomen, and some slight tenderness in LUQ, nonperitoneal. Will proceed with labs, and perform pelvic exam to decipher if pain is primarily pelvic or abdominal and decide on imaging at that time. Will give pain meds and nausea meds. Will reassess shortly.   12:12 PM VS improved with pain control. Pelvic performed, showing old blood in vault coming from cervix which is closed but friable and with some mucoid discharge. Diffuse uterine tenderness and L adnexal tenderness, +CMT, concerning for PID. Will treat empirically for GC/CT but pt is allergic to cephalosporins with unknown reaction, will use gentamicin 240mg  IM and 2g azithro. Will also get pelvic u/s. Still awaiting U/A and Upreg. CBC w/diff unremarkable aside from baseline anemia. CMP unremarkable. Lipase WNL.   12:24 PM Pt's Upreg returning showing pregnancy +. Will change orders, d/c gentamicin and hold off on empiric gc/ct tx, and get quant HCG and OB U/S. Her exam is concerning for PID therefore given this new pregnancy finding, could potentially need admission. Will reassess shortly.   2:50 PM Radiologist David SwazilandJordan calling, states that there is no IUP, there is a Complex left adnexal mass worrisome for tubo-ovarian abscess. But given the strongly positive beta HCG, ectopic pregnancy with surrounding hemorrhage may be responsible for the findings, no ring of fire or torsion. Beta  Quant HCG is 123[address] therefore very highly likely that this is ectopic pregnancy. Will consult OBGYN. VS stable, pt having more pain after U/S, given more pain meds and feeling better. Has been NPO since ~9am. Wet prep showing moderate WBCs but  otherwise unremarkable. U/A with neg nitrites, neg leuks, many bacteria, unclear if UTI vs vaginal contamination, sent for culture. ABO/Rh showing O+, no need for rhogam.   3:32 PM Dr. Clearance Coots agrees to accept pt for transfer, will proceed with this transfer now, EMTALA filed. Will monitor while she's here. Please see Dr. Verdell Carmine note for further documentation of care. Stable at this time.   BP 111/80 mmHg  Pulse 80  Temp(Src) 98.1 F (36.7 C) (Oral)  Resp 18  SpO2 98%  LMP 06/18/2014  Meds ordered this encounter  Medications  . ondansetron (ZOFRAN) injection 4 mg    Sig:   . morphine 4 MG/ML injection 4 mg    Sig:   . sodium chloride 0.9 % bolus 500 mL    Sig:   . morphine 4 MG/ML injection 4 mg    Sig:   . morphine 4 MG/ML injection 4 mg    Sig:      Nyellie Yetter Camprubi-Soms, PA-C 06/18/14 1533  Gerhard Munch, MD 06/18/14 1557

## 2014-06-18 NOTE — Transfer of Care (Signed)
Immediate Anesthesia Transfer of Care Note  Patient: Kaitlyn PavlovCristina R Berthold  Procedure(s) Performed: Procedure(s): LAPAROTOMY with removal of Left ectopic pregnancy  Patient Location: PACU  Anesthesia Type:General  Level of Consciousness: awake and oriented  Airway & Oxygen Therapy: Patient Spontanous Breathing and Patient connected to nasal cannula oxygen  Post-op Assessment: Report given to RN and Post -op Vital signs reviewed and stable  Post vital signs: Reviewed and stable  Last Vitals:  Filed Vitals:   06/18/14 1722  BP: 132/76  Pulse: 84  Temp:   Resp: 18    Complications: No apparent anesthesia complications

## 2014-06-18 NOTE — ED Notes (Signed)
Pt presents with c/o abdominal pain for approx a week and a half but has gotten worse over the last couple of days. Pt reports nausea but no vomiting or diarrhea. Pt denies being around anyone who has been sick.

## 2014-06-18 NOTE — MAU Note (Signed)
[facility].  Taken directly to room.

## 2014-06-18 NOTE — Anesthesia Procedure Notes (Signed)
Procedure Name: Intubation Date/Time: 06/18/2014 10:13 PM Performed by: Janeece AgeeWRAPE, Hubbard Seldon W Pre-anesthesia Checklist: Patient identified, Emergency Drugs available, Suction available, Patient being monitored and Timeout performed Patient Re-evaluated:Patient Re-evaluated prior to inductionOxygen Delivery Method: Circle system utilized Preoxygenation: Pre-oxygenation with 100% oxygen Intubation Type: IV induction, Rapid sequence and Cricoid Pressure applied Laryngoscope Size: Mac and 3 Grade View: Grade II Tube type: Oral Tube size: 7.0 mm Number of attempts: 1 Airway Equipment and Method: Stylet Placement Confirmation: ETT inserted through vocal cords under direct vision,  positive ETCO2 and breath sounds checked- equal and bilateral Secured at: 20 cm Tube secured with: Tape Dental Injury: Teeth and Oropharynx as per pre-operative assessment

## 2014-06-18 NOTE — Op Note (Signed)
06/18/2014  10:42 PM   PATIENT:  Kaitlyn Walton  29 y.o. female  PRE-OPERATIVE DIAGNOSIS:  ectopic pregnancy, tubal - left  POST-OPERATIVE DIAGNOSIS:  ectopic pregnancy, tubal - left  PROCEDURE:  Procedure(s): LAPAROTOMY with removal of Left ectopic pregnancy, tubal  SURGEON:  Surgeon(s) and Role:    * Brock Badharles A Harper, MD - Primary    * Lazaro ArmsLuther H Eure, MD - Assisting  PHYSICIAN ASSISTANT:   ASSISTANTS: none   ANESTHESIA:   general  EBL:  Total I/O In: 1300 [I.V.:1300] Out: 100 [Urine:50; Blood:50]  BLOOD ADMINISTERED:none  DRAINS: none   LOCAL MEDICATIONS USED:  NONE  SPECIMEN:  Source of Specimen:  Left fallopian tube with ectopic pregnancy, ruptured  DISPOSITION OF SPECIMEN:  PATHOLOGY  COUNTS:  YES   TOURNIQUET:  none  DICTATION: .Note written in EPIC: The patient brought to OR and general endotracheal anesthesia induced.  Abdomen prepped and draped in the supine position.  Time out done.  Pfannenstiel skin  incision made with the scalpel and extended down to the fascia.  The fascia nicked in the midline and the incision extended to the left and right with curved Mayo scissors.  Peritoneum digitally entered in the midline and extended to the left and right.  Moderate amount of heme and clots expelled.  The Alexis retractor placed in the incision and the bowel packed off, and the ectopic identified in the left distal fallopian tube.  The fallopian tube was clamped with a Kelly forcep beneath the ectopic, including the fimbria to the isthmic portion of the tube. The ruptured tube with the ectopic was then excised with Jen MowMetz and submitted to pathology.  A free tie of 0 Vicryl placed beneath the clamp, followed by a transfixion suture of 0 Vicryl above the knot.  Hemostasis was excellent.  The pelvic cavity then irrigated with saline and all clots removed.  The abdomen then closed as follows:  The peritoneum and fascia closed with 0 Vicryl, continuous fashion.   Subcutaneous tissue closed with 2-0 plain, and skin closed with staples.  Bandage applied.  Needle, sponge and instrument counts correct x 2.  Patient transported to RR in good condition.        PLAN OF CARE: Admit to inpatient   PATIENT DISPOSITION:  PACU - hemodynamically stable.   Delay start of Pharmacological VTE agent (>24hrs) due to surgical blood loss or risk of bleeding: not applicable

## 2014-06-19 ENCOUNTER — Encounter (HOSPITAL_COMMUNITY): Payer: Self-pay | Admitting: Obstetrics

## 2014-06-19 ENCOUNTER — Ambulatory Visit: Payer: Medicaid Other | Admitting: Obstetrics

## 2014-06-19 LAB — CBC
HCT: 30 % — ABNORMAL LOW (ref 36.0–46.0)
Hemoglobin: 9.8 g/dL — ABNORMAL LOW (ref 12.0–15.0)
MCH: 28.4 pg (ref 26.0–34.0)
MCHC: 32.7 g/dL (ref 30.0–36.0)
MCV: 87 fL (ref 78.0–100.0)
Platelets: 306 10*3/uL (ref 150–400)
RBC: 3.45 MIL/uL — ABNORMAL LOW (ref 3.87–5.11)
RDW: 13.2 % (ref 11.5–15.5)
WBC: 17.1 10*3/uL — ABNORMAL HIGH (ref 4.0–10.5)

## 2014-06-19 LAB — ABO/RH: ABO/RH(D): O POS

## 2014-06-19 LAB — GC/CHLAMYDIA PROBE AMP (~~LOC~~) NOT AT ARMC
Chlamydia: NEGATIVE
NEISSERIA GONORRHEA: NEGATIVE

## 2014-06-19 LAB — URINE CULTURE

## 2014-06-19 LAB — HIV ANTIBODY (ROUTINE TESTING W REFLEX): HIV SCREEN 4TH GENERATION: NONREACTIVE

## 2014-06-19 LAB — RPR: RPR: NONREACTIVE

## 2014-06-19 MED ORDER — MAGNESIUM HYDROXIDE 400 MG/5ML PO SUSP
30.0000 mL | Freq: Two times a day (BID) | ORAL | Status: AC
  Administered 2014-06-19 (×2): 30 mL via ORAL
  Filled 2014-06-19 (×2): qty 30

## 2014-06-19 MED ORDER — OXYCODONE HCL 5 MG PO TABS
10.0000 mg | ORAL_TABLET | ORAL | Status: DC | PRN
  Administered 2014-06-19 – 2014-06-21 (×11): 10 mg via ORAL
  Filled 2014-06-19 (×11): qty 2

## 2014-06-19 NOTE — Addendum Note (Signed)
Addendum  created 06/19/14 0747 by Yolonda KidaAlison L Sarika Baldini, CRNA   Modules edited: Notes Section   Notes Section:  File: [id number]

## 2014-06-19 NOTE — Anesthesia Postprocedure Evaluation (Signed)
  Anesthesia Post-op Note  Patient: Kaitlyn PavlovCristina R Walton  Procedure(s) Performed: Procedure(s): LAPAROTOMY with removal of Left ectopic pregnancy  Patient Location: [facility]  Anesthesia Type:General  Level of Consciousness: awake, alert , oriented and patient cooperative  Airway and Oxygen Therapy: Patient Spontanous Breathing and Patient connected to nasal cannula oxygen  Post-op Pain: mild  Post-op Assessment: Post-op Vital signs reviewed, Patient's Cardiovascular Status Stable, Respiratory Function Stable, Patent Airway and No signs of Nausea or vomiting  Post-op Vital Signs: Reviewed and stable  Last Vitals:  Filed Vitals:   06/19/14 0502  BP: 110/62  Pulse: 85  Temp: 37.2 C  Resp: 18    Complications: No apparent anesthesia complications

## 2014-06-19 NOTE — Progress Notes (Signed)
1 Day Post-Op Procedure(s): LAPAROTOMY with removal of Left ectopic pregnancy  Subjective: Patient reports incisional pain, tolerating PO and no problems voiding.    Objective: I have reviewed patient's vital signs, intake and output, medications and labs.  General: alert and no distress Resp: clear to auscultation bilaterally Cardio: regular rate and rhythm, S1, S2 normal, no murmur, click, rub or gallop GI: normal findings: soft, non-tender Extremities: extremities normal, atraumatic, no cyanosis or edema Vaginal Bleeding: none  Assessment: s/p Procedure(s): LAPAROTOMY with removal of Left ectopic pregnancy: stable and progressing well  Plan: Advance diet Encourage ambulation Advance to PO medication  LOS: 1 day    HARPER,CHARLES A 06/19/2014, 9:52 AM

## 2014-06-20 NOTE — Progress Notes (Addendum)
2 Days Post-Op Procedure(s): LAPAROTOMY with removal of Left ectopic pregnancy  Subjective: Patient reports - flatus.  Voiding well, up and ambulating.  Tolerating diet.  Objective: I have reviewed patient's vital signs, intake and output, medications and labs.  General: alert and no distress Resp: clear to auscultation bilaterally Cardio: regular rate and rhythm, S1, S2 normal, no murmur, click, rub or gallop GI: normal findings: soft, non-tender Extremities: extremities normal, atraumatic, no cyanosis or edema Vaginal Bleeding: none  Assessment: s/p Procedure(s): LAPAROTOMY with removal of Left ectopic pregnancy: stable, progressing well and tolerating diet  Plan: Advance diet Encourage ambulation  LOS: 2 days    HARPER,CHARLES A 06/20/2014, 8:48 AM

## 2014-06-21 MED ORDER — VITAFOL PO TABS: 1.0000 | ORAL_TABLET | Freq: Every morning | ORAL | Status: DC

## 2014-06-21 MED ORDER — OXYCODONE HCL 10 MG PO TABS: 10.0000 mg | ORAL_TABLET | ORAL | Status: DC | PRN

## 2014-06-21 MED ORDER — IBUPROFEN 800 MG PO TABS: 800.0000 mg | ORAL_TABLET | Freq: Three times a day (TID) | ORAL | Status: DC | PRN

## 2014-06-21 NOTE — Progress Notes (Signed)
Pt verbalizes understanding of d/c instructions, medications, follow up appts, when to seek medical care and belongings policy. IV was removed without complications. Pts family is here and will be driving her home. Sheryn BisonGordon, Deven Audi Warner

## 2014-06-21 NOTE — Progress Notes (Signed)
Discharge instructions complete. Pt understood all instructions and did not have any questions. Pt pushed via wheelchair and discharged home to family.  

## 2014-06-21 NOTE — Progress Notes (Addendum)
Pt complains that just recently after ambulating in room she felt hot, dizzy and nauseous. Pt is resting now and denies these complaints. VS assessed. BP 94/46 in left upper arm with large cuff. BP reassessed in lower arm with small cuff and is P 84, BP 110/47 at 11:30am. Sitting at 11:32am BP=121/63, P= 82 , Standing at 11:34am BP=106/67   P=92. D. Harper made aware of these complaints and that pt just passed a small clot. NO changes in orders, pt is still OK to d/c per MD   Sheryn BisonGordon, Ashritha Desrosiers Warner

## 2014-06-21 NOTE — Progress Notes (Signed)
3 Days Post-Op Procedure(s): LAPAROTOMY with removal of Left ectopic pregnancy  Subjective: Patient reports tolerating PO, + flatus, + BM and no problems voiding.    Objective: I have reviewed patient's vital signs, intake and output, medications and labs.  General: alert and no distress Resp: clear to auscultation bilaterally Cardio: regular rate and rhythm, S1, S2 normal, no murmur, click, rub or gallop GI: normal findings: soft, non-tender and incision C, D, I. Extremities: extremities normal, atraumatic, no cyanosis or edema Vaginal Bleeding: none  Assessment: s/p Procedure(s): LAPAROTOMY with removal of Left ectopic pregnancy: stable, progressing well and tolerating diet  Plan: Discharge home  F/U in office in 2 weeks.  LOS: 3 days    Madyx Delfin A 06/21/2014, 7:42 AM

## 2014-06-21 NOTE — Discharge Summary (Signed)
Physician Discharge Summary  Patient ID: Kaitlyn Walton MRN: [id number] DOB/AGE: 30/03/1985 30 y.o.  Admit date: 06/18/2014 Discharge date: 06/21/2014  Admission Diagnoses:  Discharge Diagnoses:  Active Problems:   Ectopic pregnancy, tubal   Discharged Condition: good  Hospital Course: Admitted with left tubal ectopic pregnancy.  Underwent left salpingectomy without complications.  Discharged home in good condition.  Consults: None  Significant Diagnostic Studies: labs: CBC and radiology: Ultrasound: Left tubal ectopic  Treatments: IV hydration, analgesia: oxycodone and surgery: Left salpingectomy  Discharge Exam: Blood pressure 90/43, pulse 92, temperature 98.4 F (36.9 C), temperature source Oral, resp. rate 16, height 5\' 4"  (1.626 m), weight 270 lb (122.471 kg), last menstrual period 05/24/2014, SpO2 100 %. General appearance: alert and no distress Resp: clear to auscultation bilaterally Cardio: regular rate and rhythm, S1, S2 normal, no murmur, click, rub or gallop GI: normal findings: no scars, striae, dilated veins, rashes, or lesions Extremities: extremities normal, atraumatic, no cyanosis or edema Incision/Wound: Clean. Dry. And Nontender.  Disposition: 01-Home or Self Care  Discharge Instructions    Diet - low sodium heart healthy    Complete by:  As directed      Increase activity slowly    Complete by:  As directed             Medication List    STOP taking these medications        norgestimate-ethinyl estradiol 0.25-35 MG-MCG tablet  Commonly known as:  ORTHO-CYCLEN,SPRINTEC,PREVIFEM      TAKE these medications        albuterol 108 (90 BASE) MCG/ACT inhaler  Commonly known as:  PROVENTIL HFA;VENTOLIN HFA  Inhale 2 puffs into the lungs every 6 (six) hours as needed for wheezing. For wheezing     BC HEADACHE POWDER PO  Take 1 packet by mouth daily as needed (pain).     ibuprofen 600 MG tablet  Commonly known as:  ADVIL,MOTRIN  Take 1  tablet (600 mg total) by mouth every 6 (six) hours as needed for pain.     metFORMIN 500 MG 24 hr tablet  Commonly known as:  GLUCOPHAGE XR  Take 3 tablets ( 1500mg  ) po daily with supper.     Oxycodone HCl 10 MG Tabs  Take 1 tablet (10 mg total) by mouth every 4 (four) hours as needed for moderate pain or severe pain.           Follow-up Information    Follow up with HARPER,CHARLES A, MD In 2 weeks.   Specialty:  Obstetrics and Gynecology   Contact information:   [address] [place] [id number] [phone]       Signed: Brock BadHARPER,CHARLES A 06/21/2014, 7:48 AM

## 2014-06-21 NOTE — Plan of Care (Signed)
Problem: Phase III Progression Outcomes Goal: Remove staples if indicated/incision care Outcome: Completed/Met Date Met:  06/21/14 Steri strips applied per Dr. Jacelyn Grip request.

## 2014-06-24 ENCOUNTER — Other Ambulatory Visit: Payer: Self-pay | Admitting: *Deleted

## 2014-06-24 DIAGNOSIS — O00109 Unspecified tubal pregnancy without intrauterine pregnancy: Secondary | ICD-10-CM

## 2014-06-24 MED ORDER — CONCEPT OB 130-92.4-1 MG PO CAPS: 1.0000 | ORAL_CAPSULE | Freq: Every day | ORAL | Status: DC

## 2014-06-26 ENCOUNTER — Other Ambulatory Visit: Payer: Self-pay | Admitting: Obstetrics

## 2014-06-26 DIAGNOSIS — G8918 Other acute postprocedural pain: Secondary | ICD-10-CM

## 2014-06-26 MED ORDER — HYDROMORPHONE HCL 2 MG PO TABS: 2.0000 mg | ORAL_TABLET | ORAL | Status: DC | PRN

## 2014-06-26 MED ORDER — HYDROMORPHONE HCL 2 MG PO TABS: 1.0000 mg | ORAL_TABLET | ORAL | Status: DC | PRN

## 2014-07-01 ENCOUNTER — Telehealth: Payer: Self-pay | Admitting: *Deleted

## 2014-07-01 ENCOUNTER — Other Ambulatory Visit: Payer: Self-pay | Admitting: Obstetrics

## 2014-07-01 DIAGNOSIS — G8918 Other acute postprocedural pain: Secondary | ICD-10-CM

## 2014-07-01 MED ORDER — HYDROMORPHONE HCL 4 MG PO TABS: 4.0000 mg | ORAL_TABLET | ORAL | Status: DC | PRN

## 2014-07-01 NOTE — Telephone Encounter (Signed)
Patient is calling for a pain medication refill. 2:08 Patient states Dr Clearance Cootsharper changed her pain medication last week. She is presently taking 2 pills- it seems to work better that way. Bowels are normal, no urinary complaints, and incision is clean and dry per patient. She has a follow up appointment 6/8. She is requesting a refill. Told patient I would check with her provider and see what he recommends.

## 2014-07-02 ENCOUNTER — Encounter (HOSPITAL_COMMUNITY): Payer: Self-pay | Admitting: *Deleted

## 2014-07-02 ENCOUNTER — Inpatient Hospital Stay (HOSPITAL_COMMUNITY)
Admission: AD | Admit: 2014-07-02 | Discharge: 2014-07-03 | Disposition: A | Discharge status: Home / Self Care | Payer: Medicaid Other | Source: Ambulatory Visit | Attending: Obstetrics | Admitting: Obstetrics

## 2014-07-02 ENCOUNTER — Inpatient Hospital Stay (HOSPITAL_COMMUNITY): Payer: Medicaid Other

## 2014-07-02 DIAGNOSIS — R102 Pelvic and perineal pain unspecified side: Secondary | ICD-10-CM

## 2014-07-02 DIAGNOSIS — G8918 Other acute postprocedural pain: Secondary | ICD-10-CM

## 2014-07-02 DIAGNOSIS — R1030 Lower abdominal pain, unspecified: Secondary | ICD-10-CM | POA: Diagnosis present

## 2014-07-02 LAB — CBC WITH DIFFERENTIAL/PLATELET
Basophils Absolute: 0.1 10*3/uL (ref 0.0–0.1)
Basophils Relative: 1 % (ref 0–1)
Eosinophils Absolute: 0.8 10*3/uL — ABNORMAL HIGH (ref 0.0–0.7)
Eosinophils Relative: 7 % — ABNORMAL HIGH (ref 0–5)
HEMATOCRIT: 35.1 % — AB (ref 36.0–46.0)
Hemoglobin: 11.2 g/dL — ABNORMAL LOW (ref 12.0–15.0)
LYMPHS ABS: 4.3 10*3/uL — AB (ref 0.7–4.0)
LYMPHS PCT: 37 % (ref 12–46)
MCH: 27.9 pg (ref 26.0–34.0)
MCHC: 31.9 g/dL (ref 30.0–36.0)
MCV: 87.3 fL (ref 78.0–100.0)
MONO ABS: 0.9 10*3/uL (ref 0.1–1.0)
MONOS PCT: 8 % (ref 3–12)
NEUTROS ABS: 5.5 10*3/uL (ref 1.7–7.7)
NEUTROS PCT: 47 % (ref 43–77)
Platelets: 463 10*3/uL — ABNORMAL HIGH (ref 150–400)
RBC: 4.02 MIL/uL (ref 3.87–5.11)
RDW: 12.7 % (ref 11.5–15.5)
WBC: 11.5 10*3/uL — AB (ref 4.0–10.5)

## 2014-07-02 LAB — URINALYSIS, ROUTINE W REFLEX MICROSCOPIC
Bilirubin Urine: NEGATIVE
Glucose, UA: NEGATIVE mg/dL
HGB URINE DIPSTICK: NEGATIVE
KETONES UR: NEGATIVE mg/dL
Leukocytes, UA: NEGATIVE
Nitrite: NEGATIVE
PROTEIN: NEGATIVE mg/dL
Specific Gravity, Urine: 1.015 (ref 1.005–1.030)
Urobilinogen, UA: 0.2 mg/dL (ref 0.0–1.0)
pH: 6.5 (ref 5.0–8.0)

## 2014-07-02 MED ORDER — KETOROLAC TROMETHAMINE 60 MG/2ML IM SOLN
60.0000 mg | Freq: Once | INTRAMUSCULAR | Status: AC
  Administered 2014-07-03: 60 mg via INTRAMUSCULAR
  Filled 2014-07-02: qty 2

## 2014-07-02 NOTE — MAU Provider Note (Signed)
History     CSN: [id number]  Arrival date and time: 07/02/14 1924   None     No chief complaint on file.  HPI  Ms. Kaitlyn Walton is a 29 y.o. [id number] who presents to MAU today with complaint of lower abdominal pain. The patient had a left salpingectomy for an ectopic pregnancy on 06/18/14. She states that she has been taking Dilaudid and Ibuprofen PO for pain with minimal relief. She feels pain is worse in the RLQ than LLQ. She denies vaginal bleeding, discharge, fever, N/V/D or UTI symptoms today.  OB History    Gravida Para Term Preterm AB TAB SAB Ectopic Multiple Living   0 1 0 1 0 0 3      Past Medical History  Diagnosis Date  . Asthma   . PCOS (polycystic ovarian syndrome)   . Obesity     Past Surgical History  Procedure Laterality Date  . Mouth surgery    . Lung surgery    . Laparotomy  06/18/2014    Procedure: LAPAROTOMY with removal of Left ectopic pregnancy;  Surgeon: Brock Bad, MD;  Location: [facility];  Service: Gynecology;;    Family History  Problem Relation Age of Onset  . Diabetes Mother   . Cancer Maternal Aunt   . Hypertension Maternal Grandmother   . Hypertension Maternal Grandfather   . Cancer Maternal Grandfather   . Hypertension Paternal Grandmother   . Hypertension Paternal Grandfather   . Asthma Other   . GI problems Other   . Hyperlipidemia Other   . Obesity Other   . Stroke Other   . Sleep apnea Other     History  Substance Use Topics  . Smoking status: Never Smoker   . Smokeless tobacco: Never Used  . Alcohol Use: No    Allergies:  Allergies  Allergen Reactions  . Cefaclor Other (See Comments)    "ceclor"= childhood allergy  . Sulfonamide Derivatives Other (See Comments)    Sulfa= childhood allergy    Prescriptions prior to admission  Medication Sig Dispense Refill Last Dose  . albuterol (PROVENTIL HFA;VENTOLIN HFA) 108 (90 BASE) MCG/ACT inhaler Inhale 2 puffs into the lungs every 6 (six) hours as needed  for wheezing. For wheezing 18 g 2 Past Week at Unknown time  . Aspirin-Salicylamide-Caffeine (BC HEADACHE POWDER PO) Take 1 packet by mouth daily as needed (pain).   06/17/2014 at Unknown time  . HYDROmorphone (DILAUDID) 2 MG tablet Take 1-2 tablets (2-4 mg total) by mouth every 4 (four) hours as needed for moderate pain or severe pain. 40 tablet 0   . HYDROmorphone (DILAUDID) 4 MG tablet Take 1 tablet (4 mg total) by mouth every 4 (four) hours as needed for moderate pain or severe pain. 40 tablet 0   . ibuprofen (ADVIL,MOTRIN) 600 MG tablet Take 1 tablet (600 mg total) by mouth every 6 (six) hours as needed for pain. (Patient not taking: Reported on 06/18/2014) 30 tablet 0 Not Taking at Unknown time  . ibuprofen (ADVIL,MOTRIN) 800 MG tablet Take 1 tablet (800 mg total) by mouth every 8 (eight) hours as needed. 30 tablet 5   . metFORMIN (GLUCOPHAGE XR) 500 MG 24 hr tablet Take 3 tablets (  ) po daily with supper. (Patient not taking: Reported on 06/18/2014) 90 tablet 11 Not Taking at Unknown time  . oxyCODONE 10 MG TABS Take 1 tablet (10 mg total) by mouth every 4 (four) hours as needed for moderate pain  or severe pain. 40 tablet 0   . Prenat w/o A Vit-FeFum-FePo-FA (CONCEPT OB) 130-92.4-1 MG CAPS Take 1 capsule by mouth daily. 30 capsule 11     Review of Systems  Constitutional: Negative for fever and malaise/fatigue.  Gastrointestinal: Positive for nausea and abdominal pain. Negative for vomiting, diarrhea and constipation.  Genitourinary: Negative for dysuria, urgency and frequency.       Neg - vaginal bleeding, discharge   Physical Exam   Blood pressure 140/78, pulse 88, temperature 98.5 F (36.9 C), temperature source Oral, resp. rate 18, height 5\' 3"  (1.6 m), weight 252 lb 6 oz (114.477 kg), last menstrual period 05/24/2014, SpO2 100 %, unknown if currently breastfeeding.  Physical Exam  Nursing note and vitals reviewed. Constitutional: She is oriented to person, place, and time. She  appears well-developed and well-nourished. No distress.  HENT:  Head: Normocephalic and atraumatic.  Cardiovascular: Normal rate.   Respiratory: Effort normal.  GI: Soft. She exhibits no distension and no mass. There is no tenderness. There is no rebound and no guarding.  Neurological: She is alert and oriented to person, place, and time.  Skin: Skin is warm and dry. No erythema.  Psychiatric: She has a normal mood and affect.   Results for orders placed or performed during the hospital encounter of 07/02/14 (from the past 24 hour(s))  Urinalysis, Routine w reflex microscopic (not [facility])     Status: None   Collection Time: 07/02/14  7:41 PM  Result Value Ref Range   Color, Urine YELLOW YELLOW   APPearance CLEAR CLEAR   Specific Gravity, Urine 1.015 1.005 - 1.030   pH 6.5 5.0 - 8.0   Glucose, UA NEGATIVE NEGATIVE mg/dL   Hgb urine dipstick NEGATIVE NEGATIVE   Bilirubin Urine NEGATIVE NEGATIVE   Ketones, ur NEGATIVE NEGATIVE mg/dL   Protein, ur NEGATIVE NEGATIVE mg/dL   Urobilinogen, UA 0.2 0.0 - 1.0 mg/dL   Nitrite NEGATIVE NEGATIVE   Leukocytes, UA NEGATIVE NEGATIVE  CBC with Differential/Platelet     Status: Abnormal   Collection Time: 07/02/14  9:27 PM  Result Value Ref Range   WBC 11.5 (H) 4.0 - 10.5 K/uL   RBC 4.02 3.87 - 5.11 MIL/uL   Hemoglobin 11.2 (L) 12.0 - 15.0 g/dL   HCT 09.835.1 (L) 11.936.0 - 14.746.0 %   MCV 87.3 78.0 - 100.0 fL   MCH 27.9 26.0 - 34.0 pg   MCHC 31.9 30.0 - 36.0 g/dL   RDW 82.912.7 56.211.5 - 13.015.5 %   Platelets 463 (H) 150 - 400 K/uL   Neutrophils Relative % 47 43 - 77 %   Neutro Abs 5.5 1.7 - 7.7 K/uL   Lymphocytes Relative 37 12 - 46 %   Lymphs Abs 4.3 (H) 0.7 - 4.0 K/uL   Monocytes Relative 8 3 - 12 %   Monocytes Absolute 0.9 0.1 - 1.0 K/uL   Eosinophils Relative 7 (H) 0 - 5 %   Eosinophils Absolute 0.8 (H) 0.0 - 0.7 K/uL   Basophils Relative 1 0 - 1 %   Basophils Absolute 0.1 0.0 - 0.1 K/uL    [place] Transvaginal Non-ob  07/02/2014   CLINICAL DATA:   Pelvic pain, lower quadrant tenderness. History of left salpingectomy 06/18/2014 for ectopic pregnancy. Persistent pain since surgery.  EXAM: TRANSABDOMINAL AND TRANSVAGINAL ULTRASOUND OF PELVIS  TECHNIQUE: Both transabdominal and transvaginal ultrasound examinations of the pelvis were performed. Transabdominal technique was performed for global imaging of the pelvis including uterus, ovaries, adnexal regions,  and pelvic cul-de-sac. It was necessary to proceed with endovaginal exam following the transabdominal exam to visualize the endometrium and ovaries.  COMPARISON:  Pelvic ultrasound 06/18/2014  FINDINGS: Uterus  Measurements: 9.0 x 4.3 x 5.8 cm. No fibroids or other mass visualized.  Endometrium  Thickness: 6.7 mm, normal. No focal abnormality visualized. No fluid in the endometrial canal.  Right ovary  Measurements: 3.3 x 2.4 x 2.6 cm. Normal appearance/no adnexal mass.  Left ovary  Measurements: 3.0 x 2.4 x 2.4 cm. Normal appearance/no adnexal mass. Normal blood flow to the left ovary. The previous complex left adnexal mass/ectopic pregnancy is surgically absent.  Other findings  Trace free fluid in the cul-de-sac.  IMPRESSION: Normal sonographic appearance of the pelvis post left salpingectomy for ectopic pregnancy. There is trace residual free fluid in the pelvis. Normal appearance of both ovaries.   Electronically Signed   By: Rubye Oaks M.D.   On: 07/02/2014 23:05   US Pelvis Complete  07/02/2014   CLINICAL DATA:  Pelvic pain, lower quadrant tenderness. History of left salpingectomy 06/18/2014 for ectopic pregnancy. Persistent pain since surgery.  EXAM: TRANSABDOMINAL AND TRANSVAGINAL ULTRASOUND OF PELVIS  TECHNIQUE: Both transabdominal and transvaginal ultrasound examinations of the pelvis were performed. Transabdominal technique was performed for global imaging of the pelvis including uterus, ovaries, adnexal regions, and pelvic cul-de-sac. It was necessary to proceed with endovaginal exam  following the transabdominal exam to visualize the endometrium and ovaries.  COMPARISON:  Pelvic ultrasound 06/18/2014  FINDINGS: Uterus  Measurements: 9.0 x 4.3 x 5.8 cm. No fibroids or other mass visualized.  Endometrium  Thickness: 6.7 mm, normal. No focal abnormality visualized. No fluid in the endometrial canal.  Right ovary  Measurements: 3.3 x 2.4 x 2.6 cm. Normal appearance/no adnexal mass.  Left ovary  Measurements: 3.0 x 2.4 x 2.4 cm. Normal appearance/no adnexal mass. Normal blood flow to the left ovary. The previous complex left adnexal mass/ectopic pregnancy is surgically absent.  Other findings  Trace free fluid in the cul-de-sac.  IMPRESSION: Normal sonographic appearance of the pelvis post left salpingectomy for ectopic pregnancy. There is trace residual free fluid in the pelvis. Normal appearance of both ovaries.   Electronically Signed   By: Rubye Oaks M.D.   On: 07/02/2014 23:05    MAU Course  Procedures None  MDM UA, CBC and [place] today Discussed patient with Dr. Clearance Coots at 2345. He recommends 60 mg Toradol IM in MAU and Rx for Percocet. Advise patient to take Ibuprofen previously prescribed as well. Follow-up in the office as scheduled.   Assessment and Plan  A: S/P salpingectomy Post operative pain  P: Discharge home Rx for Percocet given to patient Patient advised to take Ibuprofen as prescribed as well for inflammation Warning signs for worsening condition discussed Patient advised to follow-up with Dr. Clearance Coots as scheduled or sooner PRN for post operative follow-up Patient may return to MAU as needed or if her condition were to change or worsen   Marny Lowenstein, PA-C  07/02/2014, 11:34 PM

## 2014-07-02 NOTE — MAU Note (Signed)
Pt reports she had surgery on 05/18 for ectopic pregnancy. Ectopic was on left but pt states she has been having pain on the right side since the surgery. States they have changed her meds but the pain continues. Was told to come for eval here. Denies dysuria, denies fever.

## 2014-07-03 DIAGNOSIS — G8918 Other acute postprocedural pain: Secondary | ICD-10-CM

## 2014-07-03 MED ORDER — OXYCODONE-ACETAMINOPHEN 5-325 MG PO TABS: 1.0000 | ORAL_TABLET | Freq: Four times a day (QID) | ORAL | Status: DC | PRN

## 2014-07-03 NOTE — Discharge Instructions (Signed)
Salpingectomy, Care After °Refer to this sheet in the next few weeks. These instructions provide you with information on caring for yourself after your procedure. Your health care provider may also give you more specific instructions. Your treatment has been planned according to current medical practices, but problems sometimes occur. Call your health care provider if you have any problems or questions after your procedure. °WHAT TO EXPECT AFTER THE PROCEDURE °After your procedure, it is typical to have the following: °· Abdominal pain that can be controlled with pain medicine. °· Vaginal spotting. °· Tiredness. °HOME CARE INSTRUCTIONS °· Get plenty of rest and sleep. °· Only take over-the-counter or prescription medicines as directed by your health care provider. °· Keep incision areas clean and dry. Remove or change any bandages (dressings) only as directed by your health care provider. °· You may resume your regular diet. Eat a well-balanced diet. °· Drink enough fluids to keep your urine clear or pale yellow. °· Limit exercise and activities as directed by your health care provider. Do not lift anything heavier than 5 lb (2.3 kg) until your health care provider approves. °· Do not drive until your health care provider approves. °· Do not have sexual intercourse until your health care provider says it is okay. °· Take your temperature twice a day for the first week. Write those temperatures down. °· Follow up with your health care provider as directed. °SEEK MEDICAL CARE IF: °· You have pain when you urinate. °· You see pus coming out of the incision, or the incision is separating. °· You have increasing abdominal pain. °· You have swelling or redness in the incision area. °· You develop a rash. °· You feel lightheaded. °· You have pain that is not controlled with medicine. °SEEK IMMEDIATE MEDICAL CARE IF: °· You develop a fever. °· You have increasing abdominal pain. °· You develop chest or leg pain. °· You  develop shortness of breath. °· You pass out. °Document Released: 04/23/2010 Document Revised: 11/07/2012 Document Reviewed: 07/11/2012 °[employer]® Patient Information ©2015 [employer]. This information is not intended to replace advice given to you by your health care provider. Make sure you discuss any questions you have with your health care provider. ° °

## 2014-07-07 ENCOUNTER — Telehealth: Payer: Self-pay | Admitting: *Deleted

## 2014-07-07 NOTE — Telephone Encounter (Signed)
Patient called -she is out of her pain medication. 3:30 Call to patient- patient has taken her last percocet today. Patient has an appointment - 6/8 for postop. Encouraged patient to use Ibuprofen and Tylenol- stop long term use of narcotics per Dr Clearance CootsHarper. Will assess how she is doing at her appointment.

## 2014-07-09 ENCOUNTER — Ambulatory Visit (INDEPENDENT_AMBULATORY_CARE_PROVIDER_SITE_OTHER): Payer: Medicaid Other | Admitting: Obstetrics

## 2014-07-09 ENCOUNTER — Encounter: Payer: Self-pay | Admitting: Obstetrics

## 2014-07-09 VITALS — BP 136/85 | HR 76 | Temp 98.0°F | Ht 65.0 in | Wt 259.0 lb

## 2014-07-09 DIAGNOSIS — Z30013 Encounter for initial prescription of injectable contraceptive: Secondary | ICD-10-CM

## 2014-07-09 DIAGNOSIS — Z8759 Personal history of other complications of pregnancy, childbirth and the puerperium: Secondary | ICD-10-CM

## 2014-07-09 DIAGNOSIS — G8928 Other chronic postprocedural pain: Secondary | ICD-10-CM

## 2014-07-09 MED ORDER — CYCLOBENZAPRINE HCL 10 MG PO TABS: 10.0000 mg | ORAL_TABLET | Freq: Three times a day (TID) | ORAL | Status: DC | PRN

## 2014-07-09 MED ORDER — OXYCODONE-ACETAMINOPHEN 5-325 MG PO TABS: 1.0000 | ORAL_TABLET | ORAL | Status: DC | PRN

## 2014-07-09 MED ORDER — MEDROXYPROGESTERONE ACETATE 150 MG/ML IM SUSP: 150.0000 mg | INTRAMUSCULAR | Status: DC

## 2014-07-09 NOTE — Progress Notes (Signed)
Patient ID: Kaitlyn Walton, female   DOB: 06/03/1985, 29 y.o.   MRN: [id number]  Chief Complaint  Patient presents with  . Follow-up    HPI Kaitlyn Walton is a 29 y.o. female.  Post op check after salpingectomy for left ectopic pregnancy.   Complains of pain in RLQ that is like shooting spasm with movement.  Denies fever / chill, dysuria, diarrhea or constipation.  Wants Depo Provera for contraception. HPI  Past Medical History  Diagnosis Date  . Asthma   . PCOS (polycystic ovarian syndrome)   . Obesity     Past Surgical History  Procedure Laterality Date  . Mouth surgery    . Lung surgery    . Laparotomy  06/18/2014    Procedure: LAPAROTOMY with removal of Left ectopic pregnancy;  Surgeon: Brock Badharles A Brita Jurgensen, MD;  Location: [facility];  Service: Gynecology;;    Family History  Problem Relation Age of Onset  . Diabetes Mother   . Cancer Maternal Aunt   . Hypertension Maternal Grandmother   . Hypertension Maternal Grandfather   . Cancer Maternal Grandfather   . Hypertension Paternal Grandmother   . Hypertension Paternal Grandfather   . Asthma Other   . GI problems Other   . Hyperlipidemia Other   . Obesity Other   . Stroke Other   . Sleep apnea Other     Social History History  Substance Use Topics  . Smoking status: Never Smoker   . Smokeless tobacco: Never Used  . Alcohol Use: No    Allergies  Allergen Reactions  . Cefaclor Other (See Comments)    "ceclor"= childhood allergy  . Sulfonamide Derivatives Other (See Comments)    Sulfa= childhood allergy    Current Outpatient Prescriptions  Medication Sig Dispense Refill  . albuterol (PROVENTIL HFA;VENTOLIN HFA) 108 (90 BASE) MCG/ACT inhaler Inhale 2 puffs into the lungs every 6 (six) hours as needed for wheezing. For wheezing 18 g 2  . ibuprofen (ADVIL,MOTRIN) 800 MG tablet Take 1 tablet (800 mg total) by mouth every 8 (eight) hours as needed. 30 tablet 5  . Prenat w/o A Vit-FeFum-FePo-FA (CONCEPT OB)  130-92.4-1 MG CAPS Take 1 capsule by mouth daily. 30 capsule 11  . cyclobenzaprine (FLEXERIL) 10 MG tablet Take 1 tablet (10 mg total) by mouth every 8 (eight) hours as needed for muscle spasms. 30 tablet 1  . medroxyPROGESTERone (DEPO-PROVERA) 150 MG/ML injection Inject 1 mL (150 mg total) into the muscle every 3 (three) months. 1 mL 0  . metFORMIN (GLUCOPHAGE XR) 500 MG 24 hr tablet Take 3 tablets ( 1500mg  ) po daily with supper. (Patient not taking: Reported on 06/18/2014) 90 tablet 11  . oxyCODONE-acetaminophen (ROXICET) 5-325 MG per tablet Take 1-2 tablets by mouth every 4 (four) hours as needed for moderate pain or severe pain. 40 tablet 0   No current facility-administered medications for this visit.    Review of Systems Review of Systems Constitutional: negative for fatigue and weight loss Respiratory: negative for cough and wheezing Cardiovascular: negative for chest pain, fatigue and palpitations Gastrointestinal: positive for abdominal pain.  negative for change in bowel habits Genitourinary: muscle spasm, RLQ Integument/breast: negative for nipple discharge Musculoskeletal:negative for myalgias Neurological: negative for gait problems and tremors Behavioral/Psych: negative for abusive relationship, depression Endocrine: negative for temperature intolerance     Blood pressure 136/85, pulse 76, temperature 98 F (36.7 C), height 5\' 5"  (1.651 m), weight 259 lb (117.482 kg), last menstrual period 05/24/2014, unknown if currently  breastfeeding.  Physical Exam Physical Exam Abdomen:  normal findings: no organomegaly, soft, non-tender and no hernia  Pelvis:  External genitalia: normal general appearance Urinary system: urethral meatus normal and bladder without fullness, nontender Vaginal: normal without tenderness, induration or masses Cervix: normal appearance Adnexa: normal bimanual exam Uterus: anteverted and non-tender, normal size      Data Reviewed Recent  labs Ultrasound  Assessment     S/P left salpingectomy for ectopic.  Doing well. Abdominal muscle spasm Contraceptive management     Plan    Ibuprofen / Flexeril Rx Depo Provera Rx F/U 4 weeks  No orders of the defined types were placed in this encounter.   Meds ordered this encounter  Medications  . medroxyPROGESTERone (DEPO-PROVERA) 150 MG/ML injection    Sig: Inject 1 mL (150 mg total) into the muscle every 3 (three) months.    Dispense:  1 mL    Refill:  0  . cyclobenzaprine (FLEXERIL) 10 MG tablet    Sig: Take 1 tablet (10 mg total) by mouth every 8 (eight) hours as needed for muscle spasms.    Dispense:  30 tablet    Refill:  1  . oxyCODONE-acetaminophen (ROXICET) 5-325 MG per tablet    Sig: Take 1-2 tablets by mouth every 4 (four) hours as needed for moderate pain or severe pain.    Dispense:  40 tablet    Refill:  0

## 2014-07-10 ENCOUNTER — Ambulatory Visit: Payer: Medicaid Other

## 2014-07-11 ENCOUNTER — Ambulatory Visit (INDEPENDENT_AMBULATORY_CARE_PROVIDER_SITE_OTHER): Payer: Medicaid Other | Admitting: *Deleted

## 2014-07-11 VITALS — BP 128/82 | HR 98 | Temp 98.5°F | Wt 259.0 lb

## 2014-07-11 DIAGNOSIS — Z3042 Encounter for surveillance of injectable contraceptive: Secondary | ICD-10-CM | POA: Diagnosis not present

## 2014-07-11 MED ORDER — MEDROXYPROGESTERONE ACETATE 150 MG/ML IM SUSP
150.0000 mg | Freq: Once | INTRAMUSCULAR | Status: AC
  Administered 2014-07-11: 150 mg via INTRAMUSCULAR

## 2014-07-11 NOTE — Progress Notes (Signed)
Pt is in office today for depo injection.  This is pt first depo injection.  Pt has been made aware of depo policy and reviewed restart protocol.  Pt was advised to let office know if she has any unusual bleeding.  Pt has not had a regular cycle since surgical procedure in May.  Pt states understanding. Pt states that she had trouble getting her abdominal binder at [facility].  Pt was taken to Dr Clearance Coots office for follow up and recommendation on need for binder.  BP 128/82 mmHg  Pulse 98  Temp(Src) 98.5 F (36.9 C)  Wt 259 lb (117.482 kg)  LMP 05/24/2014     Administrations This Visit    medroxyPROGESTERone (DEPO-PROVERA) injection 150 mg    Admin Date Action Dose Route Administered By         07/11/2014 Given 150 mg Intramuscular Lanney Gins, CMA

## 2014-08-06 ENCOUNTER — Ambulatory Visit (INDEPENDENT_AMBULATORY_CARE_PROVIDER_SITE_OTHER): Payer: Medicaid Other | Admitting: Obstetrics

## 2014-08-06 VITALS — BP 129/87 | HR 80 | Temp 98.3°F | Wt 261.0 lb

## 2014-08-06 DIAGNOSIS — O001 Tubal pregnancy: Secondary | ICD-10-CM

## 2014-08-06 DIAGNOSIS — O00109 Unspecified tubal pregnancy without intrauterine pregnancy: Secondary | ICD-10-CM

## 2014-08-06 NOTE — Progress Notes (Signed)
Patient ID: Kaitlyn Walton, female   DOB: Jun 02, 1985, 29 y.o.   MRN: [id number]  Chief Complaint  Patient presents with  . Follow-up    ectopic surgery    HPI Kaitlyn Walton is a 29 y.o. female.  No complaints.  HPI  Past Medical History  Diagnosis Date  . Asthma   . PCOS (polycystic ovarian syndrome)   . Obesity     Past Surgical History  Procedure Laterality Date  . Mouth surgery    . Lung surgery    . Laparotomy  06/18/2014    Procedure: LAPAROTOMY with removal of Left ectopic pregnancy;  Surgeon: Brock Bad, MD;  Location: [facility];  Service: Gynecology;;    Family History  Problem Relation Age of Onset  . Diabetes Mother   . Cancer Maternal Aunt   . Hypertension Maternal Grandmother   . Hypertension Maternal Grandfather   . Cancer Maternal Grandfather   . Hypertension Paternal Grandmother   . Hypertension Paternal Grandfather   . Asthma Other   . GI problems Other   . Hyperlipidemia Other   . Obesity Other   . Stroke Other   . Sleep apnea Other     Social History History  Substance Use Topics  . Smoking status: Never Smoker   . Smokeless tobacco: Never Used  . Alcohol Use: No    Allergies  Allergen Reactions  . Cefaclor Other (See Comments)    "ceclor"= childhood allergy  . Sulfonamide Derivatives Other (See Comments)    Sulfa= childhood allergy    Current Outpatient Prescriptions  Medication Sig Dispense Refill  . albuterol (PROVENTIL HFA;VENTOLIN HFA) 108 (90 BASE) MCG/ACT inhaler Inhale 2 puffs into the lungs every 6 (six) hours as needed for wheezing. For wheezing 18 g 2  . ibuprofen (ADVIL,MOTRIN) 800 MG tablet Take 1 tablet (800 mg total) by mouth every 8 (eight) hours as needed. 30 tablet 5  . medroxyPROGESTERone (DEPO-PROVERA) 150 MG/ML injection Inject 1 mL (150 mg total) into the muscle every 3 (three) months. 1 mL 0  . Prenat w/o A Vit-FeFum-FePo-FA (CONCEPT OB) 130-92.4-1 MG CAPS Take 1 capsule by mouth daily. 30  capsule 11  . cyclobenzaprine (FLEXERIL) 10 MG tablet Take 1 tablet (10 mg total) by mouth every 8 (eight) hours as needed for muscle spasms. (Patient not taking: Reported on 08/06/2014) 30 tablet 1  . metFORMIN (GLUCOPHAGE XR) 500 MG 24 hr tablet Take 3 tablets (  ) po daily with supper. (Patient not taking: Reported on 06/18/2014) 90 tablet 11  . oxyCODONE-acetaminophen (ROXICET) 5-325 MG per tablet Take 1-2 tablets by mouth every 4 (four) hours as needed for moderate pain or severe pain. (Patient not taking: Reported on 08/06/2014) 40 tablet 0   No current facility-administered medications for this visit.    Review of Systems Review of Systems Constitutional: negative for fatigue and weight loss Respiratory: negative for cough and wheezing Cardiovascular: negative for chest pain, fatigue and palpitations Gastrointestinal: negative for abdominal pain and change in bowel habits Genitourinary:negative Integument/breast: negative for nipple discharge Musculoskeletal:negative for myalgias Neurological: negative for gait problems and tremors Behavioral/Psych: negative for abusive relationship, depression Endocrine: negative for temperature intolerance     Blood pressure 129/87, pulse 80, temperature 98.3 F (36.8 C), weight 261 lb (118.389 kg), unknown if currently breastfeeding.  Physical Exam Physical Exam            General:  Alert.  No distress. Abdomen:  normal findings: no organomegaly, soft, non-tender and  no hernia.  Incision C, D, I.   Pelvis:  External genitalia: normal general appearance Urinary system: urethral meatus normal and bladder without fullness, nontender Vaginal: normal without tenderness, induration or masses Cervix: normal appearance Adnexa: normal bimanual exam Uterus: anteverted and non-tender, normal size      Data Reviewed Labs Pathology  Assessment     Ectopic pregnancy.  S/P left partial salpingectomy and removal of ectopic pregnancy.  Doing  well.    Plan    F/U for annual and pap.   No orders of the defined types were placed in this encounter.   No orders of the defined types were placed in this encounter.

## 2014-08-07 ENCOUNTER — Encounter: Payer: Self-pay | Admitting: Obstetrics

## 2014-08-28 ENCOUNTER — Telehealth: Payer: Self-pay | Admitting: *Deleted

## 2014-08-28 NOTE — Telephone Encounter (Signed)
Patient had Depo in June and has started spotting 4 days- she wants to know if that is normal. 12:00 Call to patient-  Reassured that is normal

## 2014-09-29 ENCOUNTER — Other Ambulatory Visit: Payer: Self-pay | Admitting: Obstetrics

## 2014-09-29 DIAGNOSIS — Z3042 Encounter for surveillance of injectable contraceptive: Secondary | ICD-10-CM

## 2014-10-02 ENCOUNTER — Ambulatory Visit (INDEPENDENT_AMBULATORY_CARE_PROVIDER_SITE_OTHER): Payer: Medicaid Other | Admitting: *Deleted

## 2014-10-02 VITALS — BP 137/87 | HR 76 | Temp 98.7°F | Ht 65.0 in | Wt 278.3 lb

## 2014-10-02 DIAGNOSIS — Z3042 Encounter for surveillance of injectable contraceptive: Secondary | ICD-10-CM

## 2014-10-02 MED ORDER — MEDROXYPROGESTERONE ACETATE 150 MG/ML IM SUSP
150.0000 mg | INTRAMUSCULAR | Status: AC
  Administered 2014-10-02 – 2015-03-18 (×3): 150 mg via INTRAMUSCULAR

## 2014-10-02 NOTE — Progress Notes (Signed)
Pt. Was seen in the office today to receive her depo injection. Pt. Received injection in the RT. Arm. Pt. Took injection well with no problems and was released. Pt. Is aware of next injection date on December 24, 2014.  Lot# [id number] EXP. 03/2019   Injection was prepared and given by Blenda Mounts on October 02, 2014.

## 2014-10-07 ENCOUNTER — Other Ambulatory Visit: Payer: Self-pay | Admitting: Obstetrics

## 2014-10-07 ENCOUNTER — Telehealth: Payer: Self-pay | Admitting: *Deleted

## 2014-10-07 DIAGNOSIS — N946 Dysmenorrhea, unspecified: Secondary | ICD-10-CM

## 2014-10-07 MED ORDER — MEFENAMIC ACID 250 MG PO CAPS: ORAL_CAPSULE | ORAL | Status: DC

## 2014-10-07 NOTE — Telephone Encounter (Signed)
Patient has started the Depo Provera for her cycle control- but she is still having some cramping issues. She is using Ibuprofen  but it is not working well for her. She has never tried any other treatment for her pain and would be willing to try medication specific for cramping. Will ask Dr Clearance Coots for advisement. ( Maybe Ponstel candidate)

## 2014-10-08 ENCOUNTER — Telehealth: Payer: Self-pay | Admitting: *Deleted

## 2014-10-08 NOTE — Telephone Encounter (Signed)
Call placed to pt making her aware that PA for her Ponstel Rx has been sent to her insurance.  Pt advised to f/u with pharmacy for verification of coverage or denial. Pt made aware that it could take up to 2-3 days for process and approval.  Pt advised to contact office if any problems getting Rx.

## 2014-10-14 ENCOUNTER — Telehealth: Payer: Self-pay | Admitting: *Deleted

## 2014-10-14 NOTE — Telephone Encounter (Signed)
Ibuprofen recommended.

## 2014-10-14 NOTE — Telephone Encounter (Signed)
Patient states she was given Rx for her cramping and she is having SE. Patient is complaining of bad dreams, shakeiness and heart racing on Ponstel. She wants to know if there is anything else. Will check with Dr Clearance Coots.

## 2014-10-16 ENCOUNTER — Telehealth: Payer: Self-pay | Admitting: *Deleted

## 2014-10-16 DIAGNOSIS — R109 Unspecified abdominal pain: Secondary | ICD-10-CM

## 2014-10-16 MED ORDER — MELOXICAM 7.5 MG PO TABS: 7.5000 mg | ORAL_TABLET | Freq: Every day | ORAL | Status: DC

## 2014-10-16 NOTE — Telephone Encounter (Signed)
Mobic called to the pharmacy after discussing with Dr Clearance Coots

## 2014-10-16 NOTE — Telephone Encounter (Signed)
Patient is awaiting response. 9/13 11:38 Call to patient let patient know that Dr Clearance Coots wants her to try another anti inflammatory for her cramping- will not Rx narcotic pain medication. Patient is willing to try mobic.

## 2014-11-06 ENCOUNTER — Ambulatory Visit: Payer: Medicaid Other | Admitting: Obstetrics

## 2014-11-10 ENCOUNTER — Ambulatory Visit (INDEPENDENT_AMBULATORY_CARE_PROVIDER_SITE_OTHER): Payer: Medicaid Other | Admitting: Obstetrics

## 2014-11-10 ENCOUNTER — Encounter: Payer: Self-pay | Admitting: Obstetrics

## 2014-11-10 VITALS — BP 110/80 | HR 82 | Wt 272.0 lb

## 2014-11-10 DIAGNOSIS — E669 Obesity, unspecified: Secondary | ICD-10-CM

## 2014-11-10 DIAGNOSIS — Z01419 Encounter for gynecological examination (general) (routine) without abnormal findings: Secondary | ICD-10-CM

## 2014-11-10 DIAGNOSIS — Z3042 Encounter for surveillance of injectable contraceptive: Secondary | ICD-10-CM

## 2014-11-10 DIAGNOSIS — Z0000 Encounter for general adult medical examination without abnormal findings: Secondary | ICD-10-CM

## 2014-11-10 NOTE — Progress Notes (Signed)
Subjective:        Kaitlyn Walton is a 29 y.o. female here for a routine exam.  Current complaints: none.    Personal health questionnaire:  Is patient Ashkenazi Jewish, have a family history of breast and/or ovarian cancer: no Is there a family history of uterine cancer diagnosed at age < 41, gastrointestinal cancer, urinary tract cancer, family member who is a Personnel officer syndrome-associated carrier: no Is the patient overweight and hypertensive, family history of diabetes, personal history of gestational diabetes, preeclampsia or PCOS: no Is patient over 73, have PCOS,  family history of premature CHD under age 81, diabetes, smoke, have hypertension or peripheral artery disease:  no At any time, has a partner hit, kicked or otherwise hurt or frightened you?: no Over the past 2 weeks, have you felt down, depressed or hopeless?: no Over the past 2 weeks, have you felt little interest or pleasure in doing things?:no   Gynecologic History No LMP recorded. Patient has had an injection. Contraception: Depo-Provera injections Last Pap: unknown. Results were: normal Last mammogram: n/a. Results were: n/a  Obstetric History OB History  Gravida Para Term Preterm AB SAB TAB Ectopic Multiple Living  0 1 1 0 0 0 3    # Outcome Date GA Lbr Len/2nd Weight Sex Delivery Anes PTL Lv  5 Term 09/17/12 [redacted]w[redacted]d 10:38 / 00:26 7 lb 6.7 oz (3.365 kg) M Vag-Spont EPI  Y     Comments: WNL  4 SAB 2010          3 Term 05/16/04 [redacted]w[redacted]d  6 lb 8 oz (2.948 kg) F Vag-Spont EPI  Y  2 Term 05/03/02 [redacted]w[redacted]d  7 lb 2 oz (3.232 kg) F Vag-Spont EPI  Y     Comments: polydydramnios  1 Gravida               Past Medical History  Diagnosis Date  . Asthma   . PCOS (polycystic ovarian syndrome)   . Obesity     Past Surgical History  Procedure Laterality Date  . Mouth surgery    . Lung surgery    . Laparotomy  06/18/2014    Procedure: LAPAROTOMY with removal of Left ectopic pregnancy;  Surgeon: Brock Bad, MD;  Location: [facility];  Service: Gynecology;;     Current outpatient prescriptions:  .  albuterol (PROVENTIL HFA;VENTOLIN HFA) 108 (90 BASE) MCG/ACT inhaler, Inhale 2 puffs into the lungs every 6 (six) hours as needed for wheezing. For wheezing, Disp: 18 g, Rfl: 2 .  Prenat w/o A Vit-FeFum-FePo-FA (CONCEPT OB) 130-92.4-1 MG CAPS, Take 1 capsule by mouth daily., Disp: 30 capsule, Rfl: 11 .  cyclobenzaprine (FLEXERIL) 10 MG tablet, Take 1 tablet (10 mg total) by mouth every 8 (eight) hours as needed for muscle spasms. (Patient not taking: Reported on 08/06/2014), Disp: 30 tablet, Rfl: 1 .  medroxyPROGESTERone (DEPO-PROVERA) 150 MG/ML injection, INJECT INTO THE MUSCLE EVERY 3 MONTHS, Disp: 1 mL, Rfl: 3 .  meloxicam (MOBIC) 7.5 MG tablet, Take 1 tablet (7.5 mg total) by mouth daily. As needed for cramping. (Patient not taking: Reported on 11/10/2014), Disp: 30 tablet, Rfl: 1 .  metFORMIN (GLUCOPHAGE XR) 500 MG 24 hr tablet, Take 3 tablets (  ) po daily with supper. (Patient not taking: Reported on 06/18/2014), Disp: 90 tablet, Rfl: 11 .  oxyCODONE-acetaminophen (ROXICET) 5-325 MG per tablet, Take 1-2 tablets by mouth every 4 (four) hours as needed for moderate pain or severe  pain. (Patient not taking: Reported on 08/06/2014), Disp: 40 tablet, Rfl: 0  Current facility-administered medications:  .  medroxyPROGESTERone (DEPO-PROVERA) injection 150 mg, 150 mg, Intramuscular, Q90 days, Brock Bad, MD, 150 mg at 10/02/14 1138 Allergies  Allergen Reactions  . Cefaclor Other (See Comments)    "ceclor"= childhood allergy  . Sulfonamide Derivatives Other (See Comments)    Sulfa= childhood allergy    Social History  Substance Use Topics  . Smoking status: Never Smoker   . Smokeless tobacco: Never Used  . Alcohol Use: No    Family History  Problem Relation Age of Onset  . Diabetes Mother   . Cancer Maternal Aunt   . Hypertension Maternal Grandmother   . Hypertension Maternal  Grandfather   . Cancer Maternal Grandfather   . Hypertension Paternal Grandmother   . Hypertension Paternal Grandfather   . Asthma Other   . GI problems Other   . Hyperlipidemia Other   . Obesity Other   . Stroke Other   . Sleep apnea Other       Review of Systems  Constitutional: negative for fatigue and weight loss Respiratory: negative for cough and wheezing Cardiovascular: negative for chest pain, fatigue and palpitations Gastrointestinal: negative for abdominal pain and change in bowel habits Musculoskeletal:negative for myalgias Neurological: negative for gait problems and tremors Behavioral/Psych: negative for abusive relationship, depression Endocrine: negative for temperature intolerance   Genitourinary:negative for abnormal menstrual periods, genital lesions, hot flashes, sexual problems and vaginal discharge Integument/breast: negative for breast lump, breast tenderness, nipple discharge and skin lesion(s)    Objective:       BP 110/80 mmHg  Pulse 82  Wt 272 lb (123.378 kg) General:   alert  Skin:   no rash or abnormalities  Lungs:   clear to auscultation bilaterally  Heart:   regular rate and rhythm, S1, S2 normal, no murmur, click, rub or gallop  Breasts:   normal without suspicious masses, skin or nipple changes or axillary nodes  Abdomen:  normal findings: no organomegaly, soft, non-tender and no hernia  Pelvis:  External genitalia: normal general appearance Urinary system: urethral meatus normal and bladder without fullness, nontender Vaginal: normal without tenderness, induration or masses Cervix: normal appearance Adnexa: normal bimanual exam Uterus: anteverted and non-tender, normal size   Lab Review Urine pregnancy test Labs reviewed yes Radiologic studies reviewed yes    Assessment:    Healthy female exam.   Obese Contraceptive counseling and advice    Plan:    Education reviewed: calcium supplements, low fat, low cholesterol diet,  safe sex/STD prevention, self breast exams and weight bearing exercise. Contraception: Depo-Provera injections. Follow up in: 1 year.   No orders of the defined types were placed in this encounter.   Orders Placed This Encounter  Procedures  . SureSwab, Vaginosis/Vaginitis Plus

## 2014-11-12 LAB — PAP, TP IMAGING W/ HPV RNA, RFLX HPV TYPE 16,18/45: HPV mRNA, High Risk: NOT DETECTED

## 2014-11-14 LAB — SURESWAB, VAGINOSIS/VAGINITIS PLUS
Atopobium vaginae: DETECTED Log (cells/mL)
C. albicans, DNA: NOT DETECTED
C. glabrata, DNA: NOT DETECTED
C. parapsilosis, DNA: NOT DETECTED
C. trachomatis RNA, TMA: NOT DETECTED
C. tropicalis, DNA: NOT DETECTED
Gardnerella vaginalis: 7.9 Log (cells/mL)
LACTOBACILLUS SPECIES: NOT DETECTED Log (cells/mL)
MEGASPHAERA SPECIES: 7.6 Log (cells/mL)
N. gonorrhoeae RNA, TMA: NOT DETECTED
T. vaginalis RNA, QL TMA: NOT DETECTED

## 2014-11-15 ENCOUNTER — Other Ambulatory Visit: Payer: Self-pay | Admitting: Obstetrics

## 2014-11-15 DIAGNOSIS — B9689 Other specified bacterial agents as the cause of diseases classified elsewhere: Secondary | ICD-10-CM

## 2014-11-15 DIAGNOSIS — N760 Acute vaginitis: Principal | ICD-10-CM

## 2014-11-15 MED ORDER — TINIDAZOLE 500 MG PO TABS: 1000.0000 mg | ORAL_TABLET | Freq: Every day | ORAL | Status: DC

## 2014-12-24 ENCOUNTER — Ambulatory Visit (INDEPENDENT_AMBULATORY_CARE_PROVIDER_SITE_OTHER): Payer: Medicaid Other | Admitting: *Deleted

## 2014-12-24 VITALS — BP 125/85 | HR 101 | Temp 98.2°F | Wt 273.0 lb

## 2014-12-24 DIAGNOSIS — Z3042 Encounter for surveillance of injectable contraceptive: Secondary | ICD-10-CM

## 2014-12-24 NOTE — Progress Notes (Signed)
Patient in office today for a Depo Injection. Patient is on time for her injection. Patient tolerated injection well. Patient due for next injection on 03-17-15.  BP 125/85 mmHg  Pulse 101  Temp(Src) 98.2 F (36.8 C)  Wt 273 lb (123.832 kg)  Administrations This Visit    medroxyPROGESTERone (DEPO-PROVERA) injection 150 mg    Admin Date Action Dose Route Administered By         12/24/2014 Given 150 mg Intramuscular Henriette CombsAndrea L Hatton, LPN

## 2015-02-06 ENCOUNTER — Encounter: Payer: Self-pay | Admitting: Obstetrics

## 2015-02-06 ENCOUNTER — Ambulatory Visit (INDEPENDENT_AMBULATORY_CARE_PROVIDER_SITE_OTHER): Payer: Medicaid Other | Admitting: Obstetrics

## 2015-02-06 VITALS — BP 144/91 | HR 97 | Wt 271.0 lb

## 2015-02-06 DIAGNOSIS — R21 Rash and other nonspecific skin eruption: Secondary | ICD-10-CM | POA: Diagnosis not present

## 2015-02-06 MED ORDER — PREDNISONE 10 MG (21) PO TBPK: ORAL_TABLET | ORAL | Status: DC

## 2015-02-06 NOTE — Progress Notes (Signed)
Patient ID: Kaitlyn Walton, female   DOB: 02/13/1985, 30 y.o.   MRN: [id number]  Chief Complaint  Patient presents with  . Rash    rash on thighs, arms, spreading.  Has been seen by PCP    HPI Kaitlyn Walton is a 30 y.o. female.  Generalized itchy rash.  HPI  Past Medical History  Diagnosis Date  . Asthma   . PCOS (polycystic ovarian syndrome)   . Obesity     Past Surgical History  Procedure Laterality Date  . Mouth surgery    . Lung surgery    . Laparotomy  06/18/2014    Procedure: LAPAROTOMY with removal of Left ectopic pregnancy;  Surgeon: Brock Badharles A Emanuel Dowson, MD;  Location: [facility];  Service: Gynecology;;    Family History  Problem Relation Age of Onset  . Diabetes Mother   . Cancer Maternal Aunt   . Hypertension Maternal Grandmother   . Hypertension Maternal Grandfather   . Cancer Maternal Grandfather   . Hypertension Paternal Grandmother   . Hypertension Paternal Grandfather   . Asthma Other   . GI problems Other   . Hyperlipidemia Other   . Obesity Other   . Stroke Other   . Sleep apnea Other     Social History Social History  Substance Use Topics  . Smoking status: Never Smoker   . Smokeless tobacco: Never Used  . Alcohol Use: No    Allergies  Allergen Reactions  . Cefaclor Other (See Comments)    "ceclor"= childhood allergy  . Sulfonamide Derivatives Other (See Comments)    Sulfa= childhood allergy    Current Outpatient Prescriptions  Medication Sig Dispense Refill  . albuterol (PROVENTIL HFA;VENTOLIN HFA) 108 (90 BASE) MCG/ACT inhaler Inhale 2 puffs into the lungs every 6 (six) hours as needed for wheezing. For wheezing 18 g 2  . medroxyPROGESTERone (DEPO-PROVERA) 150 MG/ML injection INJECT 1ML INTO THE MUSCLE EVERY 3 MONTHS 1 mL 3  . metFORMIN (GLUCOPHAGE XR) 500 MG 24 hr tablet Take 3 tablets ( 1500mg  ) po daily with supper. (Patient not taking: Reported on 06/18/2014) 90 tablet 11   Current Facility-Administered Medications   Medication Dose Route Frequency Provider Last Rate Last Dose  . medroxyPROGESTERone (DEPO-PROVERA) injection 150 mg  150 mg Intramuscular Q90 days Brock Badharles A Archer Vise, MD   150 mg at 12/24/14 [id number]    Review of Systems Review of Systems Constitutional: negative for fatigue and weight loss Respiratory: negative for cough and wheezing Cardiovascular: negative for chest pain, fatigue and palpitations Gastrointestinal: negative for abdominal pain and change in bowel habits Genitourinary:negative Integument/breast: positive for pruritic rash on arms, trunk and thighs  Musculoskeletal:negative for myalgias Neurological: negative for gait problems and tremors Behavioral/Psych: negative for abusive relationship, depression Endocrine: negative for temperature intolerance     Blood pressure 144/91, pulse 97, weight 271 lb (122.925 kg), unknown if currently breastfeeding.  Physical Exam Physical Exam       General:  Alert and no distress      Skin:  Erythematous rash on arms, trunk and thighs  Data Reviewed Labs  Assessment     Generalized, pruritic rash     Plan    Prednisone DS Dosepak Rx F/U prn   No orders of the defined types were placed in this encounter.   No orders of the defined types were placed in this encounter.

## 2015-02-11 ENCOUNTER — Telehealth: Payer: Self-pay | Admitting: *Deleted

## 2015-02-11 NOTE — Telephone Encounter (Signed)
Patient was seen in the office for rash and given prednisone. Her symptoms are better and seems to be clearing- she wants to know what to do. Per Dr Clearance CootsHarper- may use Benadryl- watch area to see if it clears.

## 2015-02-25 ENCOUNTER — Telehealth: Payer: Self-pay | Admitting: *Deleted

## 2015-02-25 NOTE — Telephone Encounter (Signed)
Patient states she was seen for a rash that seemed to get better- it is now on her stomach- so she needs a referral.  She also needs to discuss her bleeding profile on the Depo. She has had very light bright red spotting daily for 1 month. Some cramping. She would like to continue the Depo- but needs the bleeding to stop. Told patient I would check with the provider and call her back.

## 2015-02-27 NOTE — Telephone Encounter (Signed)
No treatment of AUB needed yet.  Recommend no treatment for 1 more cycle of Depo.  F/U in 3 months and if still spotting will Rx a pack of Ortho Novum 1/35. She should go to Urgent Care for rash evaluation and treatment.

## 2015-03-02 ENCOUNTER — Telehealth: Payer: Self-pay | Admitting: *Deleted

## 2015-03-02 NOTE — Telephone Encounter (Signed)
Pt called to office as she was awaiting a return call from previous call. Return call to pt. Pt made aware of recommendations for bleeding on depo and rash problem. Pt states that after she completed round of prednisone if no better would refer to dermatology. Pt would like to know if we could refer her to derm. Pt advised that she may also be seen at urgent care if needed.  Please advise on referral.

## 2015-03-03 NOTE — Telephone Encounter (Signed)
Pt made aware to seek care at urgent care for rash. Pt states understanding.

## 2015-03-03 NOTE — Telephone Encounter (Signed)
Recommend patient go to Urgent Care first.

## 2015-03-17 ENCOUNTER — Ambulatory Visit: Payer: Medicaid Other

## 2015-03-18 ENCOUNTER — Ambulatory Visit (INDEPENDENT_AMBULATORY_CARE_PROVIDER_SITE_OTHER): Payer: Medicaid Other | Admitting: *Deleted

## 2015-03-18 VITALS — BP 121/84 | HR 79 | Wt 279.0 lb

## 2015-03-18 DIAGNOSIS — Z3042 Encounter for surveillance of injectable contraceptive: Secondary | ICD-10-CM

## 2015-03-18 NOTE — Progress Notes (Signed)
Pt is in office today for depo injection.  Pt is on time for her injection.  Pt tolerated injection well.  Pt has no other concerns today. Pt advised to RTO on 06-09-15 for next injection.    Administrations This Visit    medroxyPROGESTERone (DEPO-PROVERA) injection 150 mg    Admin Date Action Dose Route Administered By         03/18/2015 Given 150 mg Intramuscular Lanney Gins, CMA

## 2015-06-09 ENCOUNTER — Ambulatory Visit: Payer: Medicaid Other

## 2015-06-09 VITALS — BP 162/102 | HR 96 | Wt 282.0 lb

## 2015-06-09 DIAGNOSIS — Z3042 Encounter for surveillance of injectable contraceptive: Secondary | ICD-10-CM

## 2015-06-09 MED ORDER — MEDROXYPROGESTERONE ACETATE 150 MG/ML IM SUSP
150.0000 mg | Freq: Once | INTRAMUSCULAR | Status: AC
  Administered 2015-06-09: 150 mg via INTRAMUSCULAR

## 2015-06-09 NOTE — Progress Notes (Unsigned)
Patient tolerated Depo injection well in L-arm. RTO: 08-31-15. Had questions about receiving HCG shots from the bariatric clinic.

## 2015-06-13 ENCOUNTER — Encounter (HOSPITAL_COMMUNITY): Payer: Self-pay

## 2015-06-13 ENCOUNTER — Emergency Department (HOSPITAL_COMMUNITY): Payer: Medicaid Other

## 2015-06-13 ENCOUNTER — Emergency Department (HOSPITAL_COMMUNITY)
Admission: EM | Admit: 2015-06-13 | Discharge: 2015-06-13 | Disposition: A | Discharge status: Home / Self Care | Payer: Medicaid Other | Attending: Emergency Medicine | Admitting: Emergency Medicine

## 2015-06-13 DIAGNOSIS — R0600 Dyspnea, unspecified: Secondary | ICD-10-CM

## 2015-06-13 DIAGNOSIS — J45901 Unspecified asthma with (acute) exacerbation: Secondary | ICD-10-CM | POA: Insufficient documentation

## 2015-06-13 DIAGNOSIS — E669 Obesity, unspecified: Secondary | ICD-10-CM | POA: Diagnosis not present

## 2015-06-13 DIAGNOSIS — Z79899 Other long term (current) drug therapy: Secondary | ICD-10-CM | POA: Diagnosis not present

## 2015-06-13 DIAGNOSIS — R0602 Shortness of breath: Secondary | ICD-10-CM | POA: Diagnosis present

## 2015-06-13 LAB — CBC WITH DIFFERENTIAL/PLATELET
Basophils Absolute: 0.1 10*3/uL (ref 0.0–0.1)
Basophils Relative: 1 %
Eosinophils Absolute: 0.3 10*3/uL (ref 0.0–0.7)
Eosinophils Relative: 3 %
HCT: 41.7 % (ref 36.0–46.0)
Hemoglobin: 13.5 g/dL (ref 12.0–15.0)
Lymphocytes Relative: 20 %
Lymphs Abs: 2.2 10*3/uL (ref 0.7–4.0)
MCH: 27.8 pg (ref 26.0–34.0)
MCHC: 32.4 g/dL (ref 30.0–36.0)
MCV: 85.8 fL (ref 78.0–100.0)
Monocytes Absolute: 1.3 10*3/uL — ABNORMAL HIGH (ref 0.1–1.0)
Monocytes Relative: 11 %
Neutro Abs: 7.3 10*3/uL (ref 1.7–7.7)
Neutrophils Relative %: 65 %
Platelets: 339 10*3/uL (ref 150–400)
RBC: 4.86 MIL/uL (ref 3.87–5.11)
RDW: 12.9 % (ref 11.5–15.5)
WBC: 11.1 10*3/uL — ABNORMAL HIGH (ref 4.0–10.5)

## 2015-06-13 LAB — BASIC METABOLIC PANEL
Anion gap: 12 (ref 5–15)
BUN: 7 mg/dL (ref 6–20)
CO2: 26 mmol/L (ref 22–32)
Calcium: 9.2 mg/dL (ref 8.9–10.3)
Chloride: 105 mmol/L (ref 101–111)
Creatinine, Ser: 0.72 mg/dL (ref 0.44–1.00)
GFR calc Af Amer: >60 mL/min (ref >60)
GFR calc non Af Amer: >60 mL/min (ref >60)
Glucose, Bld: 95 mg/dL (ref 65–99)
Potassium: 3.9 mmol/L (ref 3.5–5.1)
Sodium: 143 mmol/L (ref 135–145)

## 2015-06-13 MED ORDER — PREDNISONE 20 MG PO TABS: 40.0000 mg | ORAL_TABLET | Freq: Every day | ORAL | Status: DC

## 2015-06-13 MED ORDER — PREDNISONE 20 MG PO TABS
60.0000 mg | ORAL_TABLET | Freq: Once | ORAL | Status: AC
  Administered 2015-06-13: 60 mg via ORAL
  Filled 2015-06-13: qty 3

## 2015-06-13 MED ORDER — GUAIFENESIN-CODEINE 100-10 MG/5ML PO SOLN
10.0000 mL | Freq: Once | ORAL | Status: AC
  Administered 2015-06-13: 10 mL via ORAL
  Filled 2015-06-13: qty 10

## 2015-06-13 MED ORDER — IOPAMIDOL (ISOVUE-370) INJECTION 76%
INTRAVENOUS | Status: AC
  Administered 2015-06-13: 100 mL
  Filled 2015-06-13: qty 100

## 2015-06-13 NOTE — ED Notes (Signed)
Pt reports cough with SHOB x1 week.  PT has hx of asthma and reports home nebulizer and inhaler have not been helpful.  Pt O2 sat is 100% RA and pt is in NAD at this time.

## 2015-06-13 NOTE — Discharge Instructions (Signed)

## 2015-06-18 NOTE — ED Provider Notes (Signed)
CSN: [id number]     Arrival date & time 06/13/15  1007 History   First MD Initiated Contact with Patient 06/13/15 1013     Chief Complaint  Patient presents with  . Shortness of Breath  . Cough  . Asthma     (Consider location/radiation/quality/duration/timing/severity/associated sxs/prior Treatment) HPI   30 year old female with cough and shortness of breath. Onset about a week ago. Persistent since then. She has a past history of asthma and feels like she may be having exacerbation. She's been using her nebulizer at home without much relief though. No fevers or chills. Cough is nonproductive. No sick contacts. No unusual leg pain or swelling.  Past Medical History  Diagnosis Date  . Asthma   . PCOS (polycystic ovarian syndrome)   . Obesity    Past Surgical History  Procedure Laterality Date  . Mouth surgery    . Lung surgery    . Laparotomy  06/18/2014    Procedure: LAPAROTOMY with removal of Left ectopic pregnancy;  Surgeon: Brock Bad, MD;  Location: [facility];  Service: Gynecology;;   Family History  Problem Relation Age of Onset  . Diabetes Mother   . Cancer Maternal Aunt   . Hypertension Maternal Grandmother   . Hypertension Maternal Grandfather   . Cancer Maternal Grandfather   . Hypertension Paternal Grandmother   . Hypertension Paternal Grandfather   . Asthma Other   . GI problems Other   . Hyperlipidemia Other   . Obesity Other   . Stroke Other   . Sleep apnea Other    Social History  Substance Use Topics  . Smoking status: Never Smoker   . Smokeless tobacco: Never Used  . Alcohol Use: No   OB History    Gravida Para Term Preterm AB TAB SAB Ectopic Multiple Living   0 1 0 1 0 0 3     Review of Systems   All systems reviewed and negative, other than as noted in HPI.  Allergies  Cefaclor and Sulfonamide derivatives  Home Medications   Prior to Admission medications   Medication Sig Start Date End Date Taking? Authorizing Provider   albuterol (PROVENTIL HFA;VENTOLIN HFA) 108 (90 BASE) MCG/ACT inhaler Inhale 2 puffs into the lungs every 6 (six) hours as needed for wheezing. For wheezing 05/23/12   Reva Bores, MD  medroxyPROGESTERone (DEPO-PROVERA) 150 MG/ML injection INJECT INTO THE MUSCLE EVERY 3 MONTHS 09/30/14   Brock Bad, MD  predniSONE (DELTASONE) 20 MG tablet Take 2 tablets (40 mg total) by mouth daily. 06/13/15   Raeford Razor, MD   BP 120/72 mmHg  Pulse 120  Temp(Src) 99.1 F (37.3 C) (Oral)  Resp 17  Ht  (1.651 m)  Wt 275 lb (124.739 kg)  BMI 45.76 kg/m2  SpO2 99% Physical Exam  Constitutional: She appears well-developed and well-nourished. No distress.  HENT:  Head: Normocephalic and atraumatic.  Eyes: Conjunctivae are normal. Right eye exhibits no discharge. Left eye exhibits no discharge.  Neck: Neck supple.  Cardiovascular: Normal rate, regular rhythm and normal heart sounds.  Exam reveals no gallop and no friction rub.   No murmur heard. Pulmonary/Chest: Effort normal. No respiratory distress. She has wheezes.  Abdominal: Soft. She exhibits no distension. There is no tenderness.  Musculoskeletal: She exhibits no edema or tenderness.  Lower extremities symmetric as compared to each other. No calf tenderness. Negative Homan's. No palpable cords.   Neurological: She is alert.  Skin: Skin is  warm and dry.  Psychiatric: She has a normal mood and affect. Her behavior is normal. Thought content normal.  Nursing note and vitals reviewed.   ED Course  Procedures (including critical care time) Labs Review Labs Reviewed  CBC WITH DIFFERENTIAL/PLATELET - Abnormal; Notable for the following:    WBC 11.1 (*)    Monocytes Absolute 1.3 (*)    All other components within normal limits  BASIC METABOLIC PANEL    Imaging Review No results found. I have personally reviewed and evaluated these images and lab results as part of my medical decision-making.   EKG  Interpretation   Date/Time:  Saturday Jun 13 2015 12:36:43 EDT Ventricular Rate:  98 PR Interval:  121 QRS Duration: 110 QT Interval:  343 QTC Calculation: 438 R Axis:   34 Text Interpretation:  Sinus rhythm No significant change since last  tracing Confirmed by Karma GanjaLINKER  MD, MARTHA [phone]) on 06/14/2015 7:08:09 PM      MDM   Final diagnoses:  Dyspnea    30 year old female with cough and shortness of breath. CT neck for PE, infectious process dissection or other serious pathology. Will place on prednisone.  Return precautions discussed.   Raeford RazorStephen Kimyatta Lecy, MD 06/18/15 [phone]

## 2015-08-31 ENCOUNTER — Other Ambulatory Visit: Payer: Self-pay | Admitting: Obstetrics

## 2015-08-31 ENCOUNTER — Other Ambulatory Visit: Payer: Self-pay | Admitting: *Deleted

## 2015-08-31 ENCOUNTER — Ambulatory Visit: Payer: Medicaid Other

## 2015-08-31 DIAGNOSIS — O99519 Diseases of the respiratory system complicating pregnancy, unspecified trimester: Principal | ICD-10-CM

## 2015-08-31 DIAGNOSIS — J45909 Unspecified asthma, uncomplicated: Secondary | ICD-10-CM

## 2015-08-31 DIAGNOSIS — Z3042 Encounter for surveillance of injectable contraceptive: Secondary | ICD-10-CM

## 2015-09-01 ENCOUNTER — Ambulatory Visit (INDEPENDENT_AMBULATORY_CARE_PROVIDER_SITE_OTHER): Payer: Medicaid Other

## 2015-09-01 VITALS — BP 155/98 | Wt 282.0 lb

## 2015-09-01 DIAGNOSIS — Z3042 Encounter for surveillance of injectable contraceptive: Secondary | ICD-10-CM | POA: Diagnosis not present

## 2015-09-01 MED ORDER — MEDROXYPROGESTERONE ACETATE 150 MG/ML IM SUSP
150.0000 mg | INTRAMUSCULAR | Status: DC
  Administered 2015-09-01: 150 mg via INTRAMUSCULAR

## 2015-09-23 ENCOUNTER — Emergency Department (HOSPITAL_COMMUNITY): Payer: Medicaid Other

## 2015-09-23 ENCOUNTER — Encounter (HOSPITAL_COMMUNITY): Payer: Self-pay | Admitting: *Deleted

## 2015-09-23 ENCOUNTER — Emergency Department (HOSPITAL_COMMUNITY)
Admission: EM | Admit: 2015-09-23 | Discharge: 2015-09-23 | Disposition: A | Discharge status: Home / Self Care | Payer: Medicaid Other | Attending: Emergency Medicine | Admitting: Emergency Medicine

## 2015-09-23 DIAGNOSIS — R1013 Epigastric pain: Secondary | ICD-10-CM | POA: Insufficient documentation

## 2015-09-23 DIAGNOSIS — J45909 Unspecified asthma, uncomplicated: Secondary | ICD-10-CM | POA: Insufficient documentation

## 2015-09-23 DIAGNOSIS — Z79899 Other long term (current) drug therapy: Secondary | ICD-10-CM | POA: Insufficient documentation

## 2015-09-23 DIAGNOSIS — R1032 Left lower quadrant pain: Secondary | ICD-10-CM | POA: Diagnosis not present

## 2015-09-23 DIAGNOSIS — R109 Unspecified abdominal pain: Secondary | ICD-10-CM

## 2015-09-23 LAB — LIPASE, BLOOD: LIPASE: 30 U/L (ref 11–51)

## 2015-09-23 LAB — URINE MICROSCOPIC-ADD ON

## 2015-09-23 LAB — CBC
HEMATOCRIT: 43.2 % (ref 36.0–46.0)
HEMOGLOBIN: 14.2 g/dL (ref 12.0–15.0)
MCH: 27.7 pg (ref 26.0–34.0)
MCHC: 32.9 g/dL (ref 30.0–36.0)
MCV: 84.4 fL (ref 78.0–100.0)
Platelets: 416 10*3/uL — ABNORMAL HIGH (ref 150–400)
RBC: 5.12 MIL/uL — ABNORMAL HIGH (ref 3.87–5.11)
RDW: 13.2 % (ref 11.5–15.5)
WBC: 14.1 10*3/uL — ABNORMAL HIGH (ref 4.0–10.5)

## 2015-09-23 LAB — URINALYSIS, ROUTINE W REFLEX MICROSCOPIC
Bilirubin Urine: NEGATIVE
Glucose, UA: NEGATIVE mg/dL
Hgb urine dipstick: NEGATIVE
Ketones, ur: NEGATIVE mg/dL
NITRITE: NEGATIVE
Protein, ur: NEGATIVE mg/dL
SPECIFIC GRAVITY, URINE: 1.019 (ref 1.005–1.030)
pH: 7.5 (ref 5.0–8.0)

## 2015-09-23 LAB — COMPREHENSIVE METABOLIC PANEL
ALBUMIN: 4.3 g/dL (ref 3.5–5.0)
ALT: 29 U/L (ref 14–54)
ANION GAP: 5 (ref 5–15)
AST: 21 U/L (ref 15–41)
Alkaline Phosphatase: 70 U/L (ref 38–126)
BUN: 11 mg/dL (ref 6–20)
CO2: 25 mmol/L (ref 22–32)
Calcium: 9.2 mg/dL (ref 8.9–10.3)
Chloride: 109 mmol/L (ref 101–111)
Creatinine, Ser: 0.65 mg/dL (ref 0.44–1.00)
GFR calc Af Amer: >60 mL/min (ref >60)
GFR calc non Af Amer: >60 mL/min (ref >60)
GLUCOSE: 85 mg/dL (ref 65–99)
POTASSIUM: 3.7 mmol/L (ref 3.5–5.1)
SODIUM: 139 mmol/L (ref 135–145)
TOTAL PROTEIN: 7.6 g/dL (ref 6.5–8.1)
Total Bilirubin: 0.2 mg/dL — ABNORMAL LOW (ref 0.3–1.2)

## 2015-09-23 LAB — PREGNANCY, URINE: PREG TEST UR: NEGATIVE

## 2015-09-23 MED ORDER — KETOROLAC TROMETHAMINE 30 MG/ML IJ SOLN
30.0000 mg | Freq: Once | INTRAMUSCULAR | Status: AC
  Administered 2015-09-23: 30 mg via INTRAVENOUS
  Filled 2015-09-23: qty 1

## 2015-09-23 MED ORDER — ONDANSETRON HCL 4 MG/2ML IJ SOLN
4.0000 mg | Freq: Once | INTRAMUSCULAR | Status: AC
  Administered 2015-09-23: 4 mg via INTRAVENOUS
  Filled 2015-09-23: qty 2

## 2015-09-23 MED ORDER — SODIUM CHLORIDE 0.9 % IV BOLUS (SEPSIS)
1000.0000 mL | Freq: Once | INTRAVENOUS | Status: AC
  Administered 2015-09-23: 1000 mL via INTRAVENOUS

## 2015-09-23 MED ORDER — OMEPRAZOLE 20 MG PO CPDR: 20.0000 mg | DELAYED_RELEASE_CAPSULE | Freq: Every day | ORAL | 0 refills | Status: DC

## 2015-09-23 MED ORDER — IOPAMIDOL (ISOVUE-300) INJECTION 61%
100.0000 mL | Freq: Once | INTRAVENOUS | Status: AC | PRN
  Administered 2015-09-23: 100 mL via INTRAVENOUS

## 2015-09-23 MED ORDER — MORPHINE SULFATE (PF) 4 MG/ML IV SOLN
4.0000 mg | Freq: Once | INTRAVENOUS | Status: AC
Start: 2015-09-23 — End: 2015-09-23
  Administered 2015-09-23: 4 mg via INTRAVENOUS
  Filled 2015-09-23: qty 1

## 2015-09-23 MED ORDER — DICYCLOMINE HCL 20 MG PO TABS: 20.0000 mg | ORAL_TABLET | Freq: Two times a day (BID) | ORAL | 0 refills | Status: DC | PRN

## 2015-09-23 NOTE — ED Notes (Signed)
Patient transported to CT 

## 2015-09-23 NOTE — ED Provider Notes (Signed)
[facility]-EMERGENCY DEPT Provider Note   CSN: [id number] Arrival date & time: 09/23/15  1521 By signing my name below, I, Kaitlyn Walton, attest that this documentation has been prepared under the direction and in the presence of non-physician practitioner, Antony Madura, PA-C  Electronically Signed: Levon Walton, Scribe. 09/23/2015. 8:42 PM.   History   Chief Complaint Chief Complaint  Patient presents with  . Abdominal Pain    HPI Kaitlyn Walton is a 30 y.o. female with PMHx of PCOS who presents to the Emergency Department complaining of intermittent, moderate RLQ abdominal pain with radiation into right lower back onset 2 weeks ago. Pt describes the pain as sharp and stabbing. She states the pain began under her rib cage and was exacerbated by eating. Per pt, pain is now unchanged by eating and instead occurs randomly. She also states pain is unchanged by deep inhalation or expiration. She notes associated nausea.  Per pt, her last period was "spotting" which lasted 3 weeks and ended a few days ago. Pt states she had an ectopic pregnancy in May 2016 and had a tubal, but denies any abdominal surgical history. She denies any vomiting, dysuria, chest pain, SOB, vaginal pain, vaginal discharge, or vaginal bleeding.   The history is provided by the patient. No language interpreter was used.    Past Medical History:  Diagnosis Date  . Asthma   . Obesity   . PCOS (polycystic ovarian syndrome)     Patient Active Problem List   Diagnosis Date Noted  . Ectopic pregnancy, tubal 06/18/2014  . Obesity 07/27/2013  . PCOS (polycystic ovarian syndrome) 07/27/2013  . Abnormal uterine bleeding (AUB) 06/11/2013  . Asthma 06/07/2012  . Monilial rash 05/23/2012  . Asthma complicating pregnancy, antepartum 05/10/2012  . History of PCOS 02/20/2012  . Supervision of low-risk pregnancy 02/20/2012  . Obesity complicating pregnancy 02/20/2012    Past Surgical History:  Procedure Laterality Date    . LAPAROTOMY  06/18/2014   Procedure: LAPAROTOMY with removal of Left ectopic pregnancy;  Surgeon: Brock Bad, MD;  Location: [facility];  Service: Gynecology;;  . LUNG SURGERY    . MOUTH SURGERY      OB History    Gravida Para Term Preterm AB Living   5 3 3  0 1 3   SAB TAB Ectopic Multiple Live Births   1 0 0 0 3      Home Medications    Prior to Admission medications   Medication Sig Start Date End Date Taking? Authorizing Provider  ADVAIR DISKUS 100-50 MCG/DOSE AEPB Inhale 1 puff into the lungs 2 (two) times daily. 09/01/15  Yes Historical Provider, MD  albuterol (PROVENTIL HFA;VENTOLIN HFA) 108 (90 BASE) MCG/ACT inhaler Inhale 2 puffs into the lungs every 6 (six) hours as needed for wheezing. For wheezing 05/23/12  Yes Reva Bores, MD  ibuprofen (ADVIL,MOTRIN) 200 MG tablet Take 400 mg by mouth every 6 (six) hours as needed for moderate pain.   Yes Historical Provider, MD  MedroxyPROGESTERone Acetate 150 MG/ML SUSY INJECT INTO THE MUSCLE EVERY 3 MONTHS 08/31/15  Yes Brock Bad, MD  dicyclomine (BENTYL) 20 MG tablet Take 1 tablet (20 mg total) by mouth 2 (two) times daily as needed (for abdominal pain/cramping). 09/23/15   Antony Madura, PA-C  omeprazole (PRILOSEC) 20 MG capsule Take 1 capsule (20 mg total) by mouth daily. 09/23/15   Antony Madura, PA-C  predniSONE (DELTASONE) 20 MG tablet Take 2 tablets (40 mg total) by mouth daily.  Patient not taking: Reported on 09/23/2015 06/13/15   Raeford Razor, MD    Family History Family History  Problem Relation Age of Onset  . Diabetes Mother   . Cancer Maternal Aunt   . Hypertension Maternal Grandmother   . Hypertension Maternal Grandfather   . Cancer Maternal Grandfather   . Hypertension Paternal Grandmother   . Hypertension Paternal Grandfather   . Asthma Other   . GI problems Other   . Hyperlipidemia Other   . Obesity Other   . Stroke Other   . Sleep apnea Other     Social History Social History  Substance Use  Topics  . Smoking status: Never Smoker  . Smokeless tobacco: Never Used  . Alcohol use No     Allergies   Cefaclor and Sulfonamide derivatives   Review of Systems Review of Systems  Cardiovascular: Negative for chest pain.  Gastrointestinal: Positive for abdominal pain and nausea. Negative for vomiting.  Genitourinary: Negative for dysuria, vaginal bleeding, vaginal discharge and vaginal pain.  Musculoskeletal: Positive for back pain.  All other systems reviewed and are negative.   Physical Exam Updated Vital Signs BP 122/89   Pulse 86   Temp 98.9 F (37.2 C) (Oral)   Resp 15   Ht 5\' 5"  (1.651 m)   Wt 127 kg   LMP 09/20/2015 Comment: U preg neg 09/23/15  SpO2 100%   BMI 46.59 kg/m   Physical Exam  Constitutional: She is oriented to person, place, and time. She appears well-developed and well-nourished. No distress.  Nontoxic/nonseptic appearing  HENT:  Head: Normocephalic and atraumatic.  Eyes: Conjunctivae and EOM are normal. No scleral icterus.  Neck: Normal range of motion.  Cardiovascular: Regular rhythm and intact distal pulses.   Mild tachycardia 100-120bpm  Pulmonary/Chest: Effort normal. No respiratory distress. She has no wheezes.  Respirations even and unlabored  Abdominal: Soft. There is tenderness. There is no rebound and no guarding.  Soft, obese abdomen. There is focal RLQ TTP as well as epigastric and LUQ tenderness. No masses or peritoneal signs.  Musculoskeletal: Normal range of motion.  Neurological: She is alert and oriented to person, place, and time. She displays normal reflexes. Coordination normal.  GCS 15. Patient moving all extremities.  Skin: Skin is warm and dry. No rash noted. She is not diaphoretic. No erythema. No pallor.  Psychiatric: She has a normal mood and affect. Her behavior is normal.  Nursing note and vitals reviewed.    ED Treatments / Results  DIAGNOSTIC STUDIES:  Oxygen Saturation is 100% on RA, normal by my  interpretation.    COORDINATION OF CARE:  8:37 PM Discussed treatment plan with pt at bedside and pt agreed to plan.   Labs (all labs ordered are listed, but only abnormal results are displayed) Labs Reviewed  COMPREHENSIVE METABOLIC PANEL - Abnormal; Notable for the following:       Result Value   Total Bilirubin 0.2 (*)    All other components within normal limits  CBC - Abnormal; Notable for the following:    WBC 14.1 (*)    RBC 5.12 (*)    Platelets 416 (*)    All other components within normal limits  URINALYSIS, ROUTINE W REFLEX MICROSCOPIC (NOT AT [facility]) - Abnormal; Notable for the following:    APPearance CLOUDY (*)    Leukocytes, UA MODERATE (*)    All other components within normal limits  URINE MICROSCOPIC-ADD ON - Abnormal; Notable for the following:    Squamous Epithelial /  LPF 6-30 (*)    Bacteria, UA MANY (*)    All other components within normal limits  LIPASE, BLOOD  PREGNANCY, URINE    EKG  EKG Interpretation  Date/Time:  Wednesday September 23 2015 16:57:58 EDT Ventricular Rate:  125 PR Interval:    QRS Duration: 94 QT Interval:  309 QTC Calculation: 446 R Axis:   2 Text Interpretation:  Sinus tachycardia Low voltage, precordial leads Confirmed by Lincoln Brighamees, Liz [phone]([id number]) on 09/23/2015 9:24:33 PM       Radiology Dg Chest 2 View  Result Date: 09/23/2015 CLINICAL DATA:  Increasing epigastric pain for 2 weeks. History of lung growth removal. EXAM: CHEST  2 VIEW COMPARISON:  CT chest Jun 13, 2015 FINDINGS: The heart size and mediastinal contours are within normal limits. Both lungs are clear. The visualized skeletal structures are nonsuspicious, large body habitus. IMPRESSION: Normal chest. Electronically Signed   By: Awilda Metroourtnay  Bloomer M.D.   On: 09/23/2015 17:53   Ct Abdomen Pelvis W Contrast  Result Date: 09/23/2015 CLINICAL DATA:  Lower abdominal pain and back pain for 2 weeks. Progressive epigastric pain. EXAM: CT ABDOMEN AND PELVIS WITH CONTRAST TECHNIQUE:  Multidetector CT imaging of the abdomen and pelvis was performed using the standard protocol following bolus administration of intravenous contrast. CONTRAST:  100mL ISOVUE-300 IOPAMIDOL (ISOVUE-300) INJECTION 61% COMPARISON:  None. FINDINGS: Lower chest:  The included lung bases are clear. Liver: No focal lesion. Hepatobiliary: Gallbladder physiologically distended, no calcified stone. No biliary dilatation. Pancreas: No ductal dilatation or inflammation. Spleen: Normal.  Small splenule at the hilum. Adrenal glands: No nodule. Kidneys: Symmetric renal enhancement. No hydronephrosis. Possible punctate nonobstructing stone in the lower left kidney. No perinephric edema. Ureters are decompressed. Stomach/Bowel: Stomach physiologically distended. There are no dilated or thickened small bowel loops. Small volume of stool throughout the colon without colonic wall thickening. The appendix is normal. Vascular/Lymphatic: No retroperitoneal, mesenteric, or pelvic adenopathy. Abdominal aorta is normal in caliber. Reproductive: Uterus is normal in size. Ovaries are symmetric. No adnexal mass. Bladder: Minimally distended, no wall thickening. Other: No free air, free fluid, or intra-abdominal fluid collection. Small fat containing umbilical hernia. Musculoskeletal: There are no acute or suspicious osseous abnormalities. Minimal vacuum phenomenon in the facets at L4-L5. IMPRESSION: 1. No acute abnormality in the abdomen/pelvis. 2. Possible nonobstructing left renal calculus. Electronically Signed   By: Rubye OaksMelanie  Ehinger M.D.   On: 09/23/2015 21:48    Procedures Procedures (including critical care time)  Medications Ordered in ED Medications  sodium chloride 0.9 % bolus 1,000 mL (0 mLs Intravenous Stopped 09/23/15 2234)  iopamidol (ISOVUE-300) 61 % injection 100 mL (100 mLs Intravenous Contrast Given 09/23/15 2135)  ketorolac (TORADOL) 30 MG/ML injection 30 mg (30 mg Intravenous Given 09/23/15 2231)  morphine 4 MG/ML  injection 4 mg (4 mg Intravenous Given 09/23/15 2233)  ondansetron (ZOFRAN) injection 4 mg (4 mg Intravenous Given 09/23/15 2229)     Initial Impression / Assessment and Plan / ED Course  I have reviewed the triage vital signs and the nursing notes.  Pertinent labs & imaging results that were available during my care of the patient were reviewed by me and considered in my medical decision making (see chart for details).  Clinical Course    [age] year old female presents to the emergency department for evaluation of 2 weeks of intermittent abdominal pain. Patient has had no vomiting or bowel changes. No evidence of acute surgical abdomen on exam. Patient is afebrile. CT abdomen pelvis obtained given mild leukocytosis.  This is negative for acute findings.  Patient tolerating fluids in the ED without difficulty. Given chronicity of symptoms, will refer to gastroenterology. Patient discharged with a course of Prilosec and Bentyl. Return precautions discussed and provided. Patient discharged in satisfactory condition with no unaddressed concerns.   Final Clinical Impressions(s) / ED Diagnoses   Final diagnoses:  Abdominal pain, unspecified abdominal location    I personally performed the services described in this documentation, which was scribed in my presence. The recorded information has been reviewed and is accurate.    New Prescriptions Discharge Medication List as of 09/23/2015 11:13 PM    START taking these medications   Details  dicyclomine (BENTYL) 20 MG tablet Take 1 tablet (20 mg total) by mouth 2 (two) times daily as needed (for abdominal pain/cramping)., Starting Wed 09/23/2015, Print    omeprazole (PRILOSEC) 20 MG capsule Take 1 capsule (20 mg total) by mouth daily., Starting Wed 09/23/2015, Print         MartinKelly Caliyah Sieh, PA-C 09/24/15 0038    Tilden FossaElizabeth Rees, MD 09/24/15 [phone]

## 2015-09-23 NOTE — ED Notes (Signed)
Pt requesting pain medication. PA made aware.

## 2015-09-23 NOTE — Discharge Instructions (Signed)
Follow-up with a gastroenterologist if your symptoms persist. You may take Bentyl for abdominal pain and cramping. Take Prilosec to cover for reflux. Return to the emergency department for any new or concerning symptoms.

## 2015-09-23 NOTE — ED Triage Notes (Signed)
Pt reports low abd pain and back pain x 2 weeks.  Pt also reports epigastric pain which is getting worse x 2 weeks.  Pt denise any urinary sxs or vaginal dc at this time.

## 2015-09-24 ENCOUNTER — Ambulatory Visit (INDEPENDENT_AMBULATORY_CARE_PROVIDER_SITE_OTHER): Payer: Medicaid Other | Admitting: Obstetrics

## 2015-09-24 ENCOUNTER — Encounter: Payer: Self-pay | Admitting: Obstetrics

## 2015-09-24 VITALS — BP 127/85 | HR 93 | Ht 65.0 in | Wt 288.7 lb

## 2015-09-24 DIAGNOSIS — A499 Bacterial infection, unspecified: Secondary | ICD-10-CM | POA: Diagnosis not present

## 2015-09-24 DIAGNOSIS — N760 Acute vaginitis: Secondary | ICD-10-CM | POA: Diagnosis not present

## 2015-09-24 DIAGNOSIS — N946 Dysmenorrhea, unspecified: Secondary | ICD-10-CM | POA: Diagnosis not present

## 2015-09-24 DIAGNOSIS — B9689 Other specified bacterial agents as the cause of diseases classified elsewhere: Secondary | ICD-10-CM

## 2015-09-24 DIAGNOSIS — R1031 Right lower quadrant pain: Secondary | ICD-10-CM

## 2015-09-24 LAB — POCT URINALYSIS DIPSTICK
Bilirubin, UA: NEGATIVE
GLUCOSE UA: NEGATIVE
Ketones, UA: NEGATIVE
Leukocytes, UA: NEGATIVE
NITRITE UA: NEGATIVE
PROTEIN UA: NEGATIVE
RBC UA: NEGATIVE
Spec Grav, UA: 1.015
UROBILINOGEN UA: 0.2
pH, UA: 5

## 2015-09-24 MED ORDER — METRONIDAZOLE 500 MG PO TABS: 500.0000 mg | ORAL_TABLET | Freq: Two times a day (BID) | ORAL | 2 refills | Status: DC

## 2015-09-24 MED ORDER — IBUPROFEN 800 MG PO TABS: 800.0000 mg | ORAL_TABLET | Freq: Three times a day (TID) | ORAL | 5 refills | Status: DC | PRN

## 2015-09-24 NOTE — Progress Notes (Signed)
Patient ID: Kaitlyn Walton, female   DOB: 09/27/1985, 30 y.o.   MRN: [id number]  Chief Complaint  Patient presents with  . Follow-up    [facility] for RLQ pain. She c/o foul vaginal odor, denies discharge/itching/irritation.    HPI Kaitlyn BarbaraCristina R Constancio is a 30 y.o. female.  Malodorous vaginal discharge and painful periods HPI  Past Medical History:  Diagnosis Date  . Asthma   . Obesity   . PCOS (polycystic ovarian syndrome)     Past Surgical History:  Procedure Laterality Date  . LAPAROTOMY  06/18/2014   Procedure: LAPAROTOMY with removal of Left ectopic pregnancy;  Surgeon: Brock Badharles A Harper, MD;  Location: [facility];  Service: Gynecology;;  . LUNG SURGERY    . MOUTH SURGERY      Family History  Problem Relation Age of Onset  . Diabetes Mother   . Cancer Maternal Aunt   . Hypertension Maternal Grandmother   . Hypertension Maternal Grandfather   . Cancer Maternal Grandfather   . Hypertension Paternal Grandmother   . Hypertension Paternal Grandfather   . Asthma Other   . GI problems Other   . Hyperlipidemia Other   . Obesity Other   . Stroke Other   . Sleep apnea Other     Social History Social History  Substance Use Topics  . Smoking status: Never Smoker  . Smokeless tobacco: Never Used  . Alcohol use No    Allergies  Allergen Reactions  . Cefaclor Other (See Comments)    "ceclor"= childhood allergy  . Sulfonamide Derivatives Other (See Comments)    Sulfa= childhood allergy    Current Outpatient Prescriptions  Medication Sig Dispense Refill  . ADVAIR DISKUS 100-50 MCG/DOSE AEPB Inhale 1 puff into the lungs 2 (two) times daily.  3  . albuterol (PROVENTIL HFA;VENTOLIN HFA) 108 (90 BASE) MCG/ACT inhaler Inhale 2 puffs into the lungs every 6 (six) hours as needed for wheezing. For wheezing 18 g 2  . dicyclomine (BENTYL) 20 MG tablet Take 1 tablet (20 mg total) by mouth 2 (two) times daily as needed (for abdominal pain/cramping). 20 tablet  0  . ibuprofen (ADVIL,MOTRIN) 800 MG tablet Take 1 tablet (800 mg total) by mouth every 8 (eight) hours as needed. 30 tablet 5  . MedroxyPROGESTERone Acetate 150 MG/ML SUSY INJECT 1ML INTO THE MUSCLE EVERY 3 MONTHS 1 Syringe 3  . metroNIDAZOLE (FLAGYL) 500 MG tablet Take 1 tablet (500 mg total) by mouth 2 (two) times daily. 14 tablet 2  . omeprazole (PRILOSEC) 40 MG capsule Take 1 capsule by mouth daily.  2  . phentermine (ADIPEX-P) 37.5 MG tablet Take 1 tablet by mouth daily. After breakfast  3   Current Facility-Administered Medications  Medication Dose Route Frequency Provider Last Rate Last Dose  . medroxyPROGESTERone (DEPO-PROVERA) injection 150 mg  150 mg Intramuscular Q90 days Levie HeritageJacob J Stinson, DO   150 mg at 09/01/15 1358    Review of Systems Review of Systems Constitutional: negative for fatigue and weight loss Respiratory: negative for cough and wheezing Cardiovascular: negative for chest pain, fatigue and palpitations Gastrointestinal: negative for abdominal pain and change in bowel habits Genitourinary:malodorous vaginal discharge and painful, crampy periods Integument/breast: negative for nipple discharge Musculoskeletal:negative for myalgias Neurological: negative for gait problems and tremors Behavioral/Psych: negative for abusive relationship, depression Endocrine: negative for temperature intolerance     Blood pressure 127/85, pulse 93, height 5\' 5"  (1.651 m), weight 288 lb 11.2 oz (131 kg), last  menstrual period 08/28/2015, unknown if currently breastfeeding.  Physical Exam Physical Exam:  Deferred   100% of 10 min visit spent on counseling and coordination of care.   Data Reviewed Labs  Assessment     BV Dysmenorrhea    Plan    Flagyl Rx F/U prn   Orders Placed This Encounter  Procedures  . POCT Urinalysis Dipstick   Meds ordered this encounter  Medications  . phentermine (ADIPEX-P) 37.5 MG tablet    Sig: Take 1 tablet by mouth daily. After  breakfast    Refill:  3  . omeprazole (PRILOSEC) 40 MG capsule    Sig: Take 1 capsule by mouth daily.    Refill:  2  . metroNIDAZOLE (FLAGYL) 500 MG tablet    Sig: Take 1 tablet (500 mg total) by mouth 2 (two) times daily.    Dispense:  14 tablet    Refill:  2  . ibuprofen (ADVIL,MOTRIN) 800 MG tablet    Sig: Take 1 tablet (800 mg total) by mouth every 8 (eight) hours as needed.    Dispense:  30 tablet    Refill:  5

## 2015-11-09 MED ORDER — ALBUTEROL SULFATE HFA 108 (90 BASE) MCG/ACT IN AERS: 2.0000 | INHALATION_SPRAY | Freq: Four times a day (QID) | RESPIRATORY_TRACT | 2 refills | Status: DC | PRN

## 2015-11-09 NOTE — Progress Notes (Signed)
Done

## 2015-11-11 ENCOUNTER — Ambulatory Visit: Payer: Self-pay | Admitting: Obstetrics

## 2015-12-02 ENCOUNTER — Encounter: Payer: Self-pay | Admitting: Obstetrics

## 2015-12-02 ENCOUNTER — Other Ambulatory Visit (HOSPITAL_COMMUNITY)
Admission: RE | Admit: 2015-12-02 | Discharge: 2015-12-02 | Disposition: A | Discharge status: Home / Self Care | Payer: Medicaid Other | Source: Ambulatory Visit | Attending: Obstetrics | Admitting: Obstetrics

## 2015-12-02 ENCOUNTER — Ambulatory Visit: Payer: Self-pay

## 2015-12-02 ENCOUNTER — Ambulatory Visit (INDEPENDENT_AMBULATORY_CARE_PROVIDER_SITE_OTHER): Payer: Medicaid Other | Admitting: Obstetrics

## 2015-12-02 DIAGNOSIS — Z3042 Encounter for surveillance of injectable contraceptive: Secondary | ICD-10-CM | POA: Diagnosis not present

## 2015-12-02 DIAGNOSIS — Z01419 Encounter for gynecological examination (general) (routine) without abnormal findings: Secondary | ICD-10-CM | POA: Diagnosis not present

## 2015-12-02 DIAGNOSIS — Z124 Encounter for screening for malignant neoplasm of cervix: Secondary | ICD-10-CM | POA: Diagnosis not present

## 2015-12-02 DIAGNOSIS — Z1151 Encounter for screening for human papillomavirus (HPV): Secondary | ICD-10-CM | POA: Insufficient documentation

## 2015-12-02 MED ORDER — MEDROXYPROGESTERONE ACETATE 150 MG/ML IM SUSP
150.0000 mg | Freq: Once | INTRAMUSCULAR | Status: AC
  Administered 2015-12-02: 150 mg via INTRAMUSCULAR

## 2015-12-02 NOTE — Progress Notes (Signed)
Subjective:        Kaitlyn Walton is a 30 y.o. female here for a routine exam.  Current complaints: None.    Personal health questionnaire:  Is patient Ashkenazi Jewish, have a family history of breast and/or ovarian cancer: no Is there a family history of uterine cancer diagnosed at age < 8450, gastrointestinal cancer, urinary tract cancer, family member who is a Personnel officerLynch syndrome-associated carrier: no Is the patient overweight and hypertensive, family history of diabetes, personal history of gestational diabetes, preeclampsia or PCOS: no Is patient over 7355, have PCOS,  family history of premature CHD under age 31, diabetes, smoke, have hypertension or peripheral artery disease:  no At any time, has a partner hit, kicked or otherwise hurt or frightened you?: no Over the past 2 weeks, have you felt down, depressed or hopeless?: no Over the past 2 weeks, have you felt little interest or pleasure in doing things?:no   Gynecologic History No LMP recorded. Patient has had an injection. Contraception: Depo-Provera injections Last Pap: 2016. Results were: normal Last mammogram: n/a. Results were: n/a  Obstetric History OB History  Gravida Para Term Preterm AB Living  5 3 3  0 1 3  SAB TAB Ectopic Multiple Live Births  1 0 0 0 3    # Outcome Date GA Lbr Len/2nd Weight Sex Delivery Anes PTL Lv  5 Term 09/17/12 361w6d 10:38 / 00:26 7 lb 6.7 oz (3.365 kg) M Vag-Spont EPI  LIV     Birth Comments: WNL  4 SAB 2010          3 Term 05/16/04 5670w0d  6 lb 8 oz (2.948 kg) F Vag-Spont EPI  LIV  2 Term 05/03/02 7170w0d  7 lb 2 oz (3.232 kg) F Vag-Spont EPI  LIV     Birth Comments: polydydramnios  1 Gravida               Past Medical History:  Diagnosis Date  . Asthma   . Obesity   . PCOS (polycystic ovarian syndrome)     Past Surgical History:  Procedure Laterality Date  . LAPAROTOMY  06/18/2014   Procedure: LAPAROTOMY with removal of Left ectopic pregnancy;  Surgeon: Brock Badharles A  Harper, MD;  Location: [facility];  Service: Gynecology;;  . LUNG SURGERY    . MOUTH SURGERY       Current Outpatient Prescriptions:  .  albuterol (PROVENTIL HFA;VENTOLIN HFA) 108 (90 Base) MCG/ACT inhaler, Inhale 2 puffs into the lungs every 6 (six) hours as needed for wheezing. For wheezing, Disp: 18 g, Rfl: 2 .  ibuprofen (ADVIL,MOTRIN) 800 MG tablet, Take 1 tablet (800 mg total) by mouth every 8 (eight) hours as needed., Disp: 30 tablet, Rfl: 5 .  MedroxyPROGESTERone Acetate 150 MG/ML SUSY, INJECT 1ML INTO THE MUSCLE EVERY 3 MONTHS, Disp: 1 Syringe, Rfl: 3 .  metroNIDAZOLE (FLAGYL) 500 MG tablet, Take 1 tablet (500 mg total) by mouth 2 (two) times daily., Disp: 14 tablet, Rfl: 2 .  phentermine (ADIPEX-P) 37.5 MG tablet, Take 1 tablet by mouth daily. After breakfast, Disp: , Rfl: 3 .  ADVAIR DISKUS 100-50 MCG/DOSE AEPB, Inhale 1 puff into the lungs 2 (two) times daily., Disp: , Rfl: 3 .  dicyclomine (BENTYL) 20 MG tablet, Take 1 tablet (20 mg total) by mouth 2 (two) times daily as needed (for abdominal pain/cramping). (Patient not taking: Reported on 12/02/2015), Disp: 20 tablet, Rfl: 0 .  omeprazole (PRILOSEC) 40 MG capsule, Take 1 capsule by mouth  daily., Disp: , Rfl: 2  Current Facility-Administered Medications:  .  medroxyPROGESTERone (DEPO-PROVERA) injection 150 mg, 150 mg, Intramuscular, Q90 days, Levie HeritageJacob J Stinson, DO, 150 mg at 09/01/15 1358 Allergies  Allergen Reactions  . Cefaclor Other (See Comments)    "ceclor"= childhood allergy  . Sulfonamide Derivatives Other (See Comments)    Sulfa= childhood allergy    Social History  Substance Use Topics  . Smoking status: Never Smoker  . Smokeless tobacco: Never Used  . Alcohol use No    Family History  Problem Relation Age of Onset  . Diabetes Mother   . Cancer Maternal Aunt   . Hypertension Maternal Grandmother   . Hypertension Maternal Grandfather   . Cancer Maternal Grandfather   . Hypertension Paternal Grandmother   .  Hypertension Paternal Grandfather   . Asthma Other   . GI problems Other   . Hyperlipidemia Other   . Obesity Other   . Stroke Other   . Sleep apnea Other       Review of Systems  Constitutional: negative for fatigue and weight loss Respiratory: negative for cough and wheezing Cardiovascular: negative for chest pain, fatigue and palpitations Gastrointestinal: negative for abdominal pain and change in bowel habits Musculoskeletal:negative for myalgias Neurological: negative for gait problems and tremors Behavioral/Psych: negative for abusive relationship, depression Endocrine: negative for temperature intolerance   Genitourinary:negative for abnormal menstrual periods, genital lesions, hot flashes, sexual problems and vaginal discharge Integument/breast: negative for breast lump, breast tenderness, nipple discharge and skin lesion(s)    Objective:       BP 126/89   Pulse (!) 109   Temp 97.7 F (36.5 C) (Oral)   Ht 5\' 4"  (1.626 m)   Wt 279 lb 8 oz (126.8 kg)   BMI 47.98 kg/m  General:   alert  Skin:   no rash or abnormalities  Lungs:   clear to auscultation bilaterally  Heart:   regular rate and rhythm, S1, S2 normal, no murmur, click, rub or gallop  Breasts:   normal without suspicious masses, skin or nipple changes or axillary nodes  Abdomen:  normal findings: no organomegaly, soft, non-tender and no hernia  Pelvis:  External genitalia: normal general appearance Urinary system: urethral meatus normal and bladder without fullness, nontender Vaginal: normal without tenderness, induration or masses Cervix: normal appearance Adnexa: normal bimanual exam Uterus: anteverted and non-tender, normal size   Lab Review Urine pregnancy test Labs reviewed yes Radiologic studies reviewed no  50% of 20 min visit spent on counseling and coordination of care.   Assessment:    Healthy female exam.    Plan:    Education reviewed: calcium supplements, low fat, low cholesterol  diet, safe sex/STD prevention, self breast exams and weight bearing exercise. Contraception: Depo-Provera injections. Follow up in: 1 year.   No orders of the defined types were placed in this encounter.  No orders of the defined types were placed in this encounter.     Patient ID: Kaitlyn Walton, female   DOB: 01/27/1986, 30 y.o.   MRN: [id number]

## 2015-12-02 NOTE — Addendum Note (Signed)
Addended by: Francene FindersJAMES, QUINETTA C on: 12/02/2015 09:30 AM   Modules accepted: Orders

## 2015-12-02 NOTE — Addendum Note (Signed)
Addended by: Francene FindersJAMES, QUINETTA C on: 12/02/2015 01:30 PM   Modules accepted: Orders

## 2015-12-08 LAB — CYTOLOGY - PAP
DIAGNOSIS: NEGATIVE
HPV: NOT DETECTED

## 2015-12-09 ENCOUNTER — Telehealth: Payer: Self-pay | Admitting: Obstetrics

## 2015-12-09 ENCOUNTER — Other Ambulatory Visit: Payer: Self-pay | Admitting: Obstetrics

## 2015-12-09 DIAGNOSIS — A749 Chlamydial infection, unspecified: Secondary | ICD-10-CM

## 2015-12-09 DIAGNOSIS — N739 Female pelvic inflammatory disease, unspecified: Secondary | ICD-10-CM

## 2015-12-09 DIAGNOSIS — N760 Acute vaginitis: Secondary | ICD-10-CM

## 2015-12-09 DIAGNOSIS — B9689 Other specified bacterial agents as the cause of diseases classified elsewhere: Secondary | ICD-10-CM

## 2015-12-09 LAB — NUSWAB VG+, CANDIDA 6SP
Atopobium vaginae: HIGH Score — AB
BVAB 2: HIGH {score} — AB
CANDIDA ALBICANS, NAA: NEGATIVE
CANDIDA GLABRATA, NAA: NEGATIVE
CANDIDA PARAPSILOSIS, NAA: NEGATIVE
CANDIDA TROPICALIS, NAA: NEGATIVE
Candida krusei, NAA: NEGATIVE
Candida lusitaniae, NAA: NEGATIVE
Chlamydia trachomatis, NAA: POSITIVE — AB
MEGASPHAERA 1: HIGH {score} — AB
Neisseria gonorrhoeae, NAA: NEGATIVE
TRICH VAG BY NAA: NEGATIVE

## 2015-12-09 MED ORDER — DOXYCYCLINE HYCLATE 100 MG PO CAPS: 100.0000 mg | ORAL_CAPSULE | Freq: Two times a day (BID) | ORAL | 1 refills | Status: DC

## 2015-12-09 MED ORDER — AZITHROMYCIN 250 MG PO TABS: 1000.0000 mg | ORAL_TABLET | Freq: Once | ORAL | 0 refills | Status: AC

## 2015-12-09 MED ORDER — METRONIDAZOLE 500 MG PO TABS: 500.0000 mg | ORAL_TABLET | Freq: Two times a day (BID) | ORAL | 0 refills | Status: DC

## 2015-12-09 NOTE — Telephone Encounter (Signed)
Patient called stating she went to pharmacy to pick up pre natal vitamins and there were also some antibiotics there for her from Dr. Clearance CootsHarper so she wanted to know what were the antibiotics for wants a call back today

## 2015-12-10 NOTE — Telephone Encounter (Signed)
Spoke to patient- reviewed her results

## 2015-12-23 ENCOUNTER — Telehealth: Payer: Self-pay | Admitting: *Deleted

## 2015-12-23 NOTE — Telephone Encounter (Signed)
Patient requested call back- no reason left. 2:19 LM on VM to CB

## 2015-12-23 NOTE — Telephone Encounter (Signed)
2:22 Patient called back. 2:53 Patient is almost finished with her antibiotics- she has chest congestion and was wondering why with all of the antibiotics- she wasn't getting better with that. Told patient- she may have something viral or the antibiotic she is taking may not be targeting that particular bacteria. She is spotting a little which is normal with Depo. She does have some lower quadrant pain- told patient she should not be ovulating- and not to forget she does have other organs there- gall bladder, appendix- be aware and if it gets worse have it checked out.

## 2016-01-27 ENCOUNTER — Encounter: Payer: Self-pay | Admitting: Obstetrics

## 2016-01-27 ENCOUNTER — Ambulatory Visit (INDEPENDENT_AMBULATORY_CARE_PROVIDER_SITE_OTHER): Payer: Medicaid Other | Admitting: Obstetrics

## 2016-01-27 VITALS — BP 131/91 | HR 97 | Temp 98.8°F | Wt 291.5 lb

## 2016-01-27 DIAGNOSIS — Z202 Contact with and (suspected) exposure to infections with a predominantly sexual mode of transmission: Secondary | ICD-10-CM | POA: Diagnosis not present

## 2016-01-27 DIAGNOSIS — A749 Chlamydial infection, unspecified: Secondary | ICD-10-CM

## 2016-01-27 DIAGNOSIS — R102 Pelvic and perineal pain: Secondary | ICD-10-CM

## 2016-01-27 DIAGNOSIS — N739 Female pelvic inflammatory disease, unspecified: Secondary | ICD-10-CM

## 2016-01-27 DIAGNOSIS — N760 Acute vaginitis: Secondary | ICD-10-CM | POA: Diagnosis not present

## 2016-01-27 DIAGNOSIS — B9689 Other specified bacterial agents as the cause of diseases classified elsewhere: Secondary | ICD-10-CM

## 2016-01-27 MED ORDER — DOXYCYCLINE HYCLATE 100 MG PO CAPS: 100.0000 mg | ORAL_CAPSULE | Freq: Two times a day (BID) | ORAL | 1 refills | Status: DC

## 2016-01-27 MED ORDER — METRONIDAZOLE 500 MG PO TABS: 500.0000 mg | ORAL_TABLET | Freq: Two times a day (BID) | ORAL | 1 refills | Status: DC

## 2016-01-27 NOTE — Progress Notes (Signed)
Patient ID: Vanessa BarbaraCristina R Esquilin, female   DOB: 02/24/1985, 30 y.o.   MRN: [id number]  Chief Complaint  Patient presents with  . Follow-up    BV    HPI Vanessa BarbaraCristina R Scarpino is a [age] y.o. female.  History of positive chlamydia and pelvic pain ~ 6 weeks ago.  Presents today for follow up.  Still has some RLQ pain.  Denies fever / chills, dysuria, N/V, diarrhea / constipation. HPI  Past Medical History:  Diagnosis Date  . Asthma   . Obesity   . PCOS (polycystic ovarian syndrome)     Past Surgical History:  Procedure Laterality Date  . LAPAROTOMY  06/18/2014   Procedure: LAPAROTOMY with removal of Left ectopic pregnancy;  Surgeon: Brock Badharles A Maxxwell Edgett, MD;  Location: [facility];  Service: Gynecology;;  . LUNG SURGERY    . MOUTH SURGERY      Family History  Problem Relation Age of Onset  . Diabetes Mother   . Cancer Maternal Aunt   . Hypertension Maternal Grandmother   . Hypertension Maternal Grandfather   . Cancer Maternal Grandfather   . Hypertension Paternal Grandmother   . Hypertension Paternal Grandfather   . Asthma Other   . GI problems Other   . Hyperlipidemia Other   . Obesity Other   . Stroke Other   . Sleep apnea Other     Social History Social History  Substance Use Topics  . Smoking status: Never Smoker  . Smokeless tobacco: Never Used  . Alcohol use No    Allergies  Allergen Reactions  . Cefaclor Other (See Comments)    "ceclor"= childhood allergy  . Sulfonamide Derivatives Other (See Comments)    Sulfa= childhood allergy    Current Outpatient Prescriptions  Medication Sig Dispense Refill  . albuterol (PROVENTIL HFA;VENTOLIN HFA) 108 (90 Base) MCG/ACT inhaler Inhale 2 puffs into the lungs every 6 (six) hours as needed for wheezing. For wheezing 18 g 2  . MedroxyPROGESTERone Acetate 150 MG/ML SUSY INJECT 1ML INTO THE MUSCLE EVERY 3 MONTHS 1 Syringe 3  . ADVAIR DISKUS 100-50 MCG/DOSE AEPB Inhale 1 puff into the lungs 2 (two) times daily.  3  . dicyclomine  (BENTYL) 20 MG tablet Take 1 tablet (20 mg total) by mouth 2 (two) times daily as needed (for abdominal pain/cramping). (Patient not taking: Reported on 01/27/2016) 20 tablet 0  . doxycycline (VIBRAMYCIN) 100 MG capsule Take 1 capsule (100 mg total) by mouth 2 (two) times daily. 28 capsule 1  . ibuprofen (ADVIL,MOTRIN) 800 MG tablet Take 1 tablet (800 mg total) by mouth every 8 (eight) hours as needed. (Patient not taking: Reported on 01/27/2016) 30 tablet 5  . metroNIDAZOLE (FLAGYL) 500 MG tablet Take 1 tablet (500 mg total) by mouth 2 (two) times daily. 28 tablet 1  . omeprazole (PRILOSEC) 40 MG capsule Take 1 capsule by mouth daily.  2  . phentermine (ADIPEX-P) 37.5 MG tablet Take 1 tablet by mouth daily. After breakfast  3   Current Facility-Administered Medications  Medication Dose Route Frequency Provider Last Rate Last Dose  . medroxyPROGESTERone (DEPO-PROVERA) injection 150 mg  150 mg Intramuscular Q90 days Levie HeritageJacob J Stinson, DO   150 mg at 09/01/15 1358    Review of Systems Review of Systems Constitutional: negative for fatigue and weight loss Respiratory: negative for cough and wheezing Cardiovascular: negative for chest pain, fatigue and palpitations Gastrointestinal: negative for abdominal pain and change in bowel habits Genitourinary:positive for RLQ pain Integument/breast: negative for nipple discharge Musculoskeletal:negative  for myalgias Neurological: negative for gait problems and tremors Behavioral/Psych: negative for abusive relationship, depression Endocrine: negative for temperature intolerance      Blood pressure (!) 131/91, pulse 97, temperature 98.8 F (37.1 C), temperature source Oral, weight 291 lb 8 oz (132.2 kg), unknown if currently breastfeeding.  Physical Exam Physical Exam:  Deferred > 50 % of 10 min visit spent on counseling and coordination of care.    Data Reviewed Cultures Wet prep  Assessment     Pelvic pain BV History of Chlamydia infection,  treated.  Possible ascending PID.    Plan     Treated PID as outpatient with 2 weeks of Doxy / Flagyl.  Stable, but still mild residual pain.  Ultrasound ordered to assess adnexa  Repeat 2 week course of Doxy / Flagyl  Follow up in 3 months  Orders Placed This Encounter  Procedures  . GC/Chlamydia Probe Amp  . US Pelvis Complete    Standing Status:   Future    Standing Expiration Date:   03/29/2017    Order Specific Question:   Reason for Exam (SYMPTOM  OR DIAGNOSIS REQUIRED)    Answer:   Pelvic pain    Order Specific Question:   Preferred imaging location?    Answer:   Internal  . US Transvaginal Non-OB    Standing Status:   Future    Standing Expiration Date:   03/29/2017    Order Specific Question:   Reason for Exam (SYMPTOM  OR DIAGNOSIS REQUIRED)    Answer:   Pelvic pain    Order Specific Question:   Preferred imaging location?    Answer:   Internal   Meds ordered this encounter  Medications  . doxycycline (VIBRAMYCIN) 100 MG capsule    Sig: Take 1 capsule (100 mg total) by mouth 2 (two) times daily.    Dispense:  28 capsule    Refill:  1  . metroNIDAZOLE (FLAGYL) 500 MG tablet    Sig: Take 1 tablet (500 mg total) by mouth 2 (two) times daily.    Dispense:  28 tablet    Refill:  1

## 2016-02-02 ENCOUNTER — Telehealth: Payer: Self-pay | Admitting: *Deleted

## 2016-02-02 NOTE — Telephone Encounter (Signed)
Pt called to office with no message left. Attempt to return call.  LM on Vm to call back.

## 2016-02-03 ENCOUNTER — Other Ambulatory Visit: Payer: Self-pay | Admitting: Obstetrics

## 2016-02-03 ENCOUNTER — Encounter: Payer: Self-pay | Admitting: *Deleted

## 2016-02-03 DIAGNOSIS — N730 Acute parametritis and pelvic cellulitis: Secondary | ICD-10-CM

## 2016-02-03 MED ORDER — LEVOFLOXACIN 750 MG PO TABS: 750.0000 mg | ORAL_TABLET | Freq: Every day | ORAL | 0 refills | Status: DC

## 2016-02-03 NOTE — Telephone Encounter (Signed)
Spoke with pt.  She states that the combo of medication given by Dr Clearance CootsHarper at last visit is making her feel very sick.  Pt states stomach pains, cramping, nausea, HA. Pt states that her pelvic pain has increased as well. Reviewed with Dr Clearance CootsHarper, pt is to d/c both Flagyl and Doxy and he has sent in a new Rx. Pt advised to pick up and call office if any problems.

## 2016-02-05 ENCOUNTER — Ambulatory Visit (HOSPITAL_COMMUNITY)
Admission: RE | Admit: 2016-02-05 | Discharge: 2016-02-05 | Disposition: A | Discharge status: Home / Self Care | Payer: Medicaid Other | Source: Ambulatory Visit | Attending: Obstetrics | Admitting: Obstetrics

## 2016-02-05 DIAGNOSIS — R938 Abnormal findings on diagnostic imaging of other specified body structures: Secondary | ICD-10-CM | POA: Diagnosis not present

## 2016-02-05 DIAGNOSIS — R102 Pelvic and perineal pain: Secondary | ICD-10-CM | POA: Diagnosis present

## 2016-02-05 DIAGNOSIS — Z202 Contact with and (suspected) exposure to infections with a predominantly sexual mode of transmission: Secondary | ICD-10-CM | POA: Diagnosis present

## 2016-02-10 ENCOUNTER — Telehealth: Payer: Self-pay | Admitting: *Deleted

## 2016-02-10 NOTE — Telephone Encounter (Signed)
Patient is calling for the results of her US- Dr Clearance CootsHarper told her to call for results.

## 2016-02-11 ENCOUNTER — Other Ambulatory Visit: Payer: Self-pay | Admitting: Obstetrics

## 2016-02-16 ENCOUNTER — Other Ambulatory Visit: Payer: Self-pay

## 2016-03-16 ENCOUNTER — Ambulatory Visit (INDEPENDENT_AMBULATORY_CARE_PROVIDER_SITE_OTHER): Payer: Medicaid Other

## 2016-03-16 DIAGNOSIS — Z3202 Encounter for pregnancy test, result negative: Secondary | ICD-10-CM

## 2016-03-16 DIAGNOSIS — Z30013 Encounter for initial prescription of injectable contraceptive: Secondary | ICD-10-CM

## 2016-03-16 LAB — POCT URINE PREGNANCY: PREG TEST UR: NEGATIVE

## 2016-03-16 NOTE — Progress Notes (Signed)
Patient is here for a Pregnancy Test. UPT-NEGATIVE.  Patient will return in 2 weeks for 2nd. UPT and DEPO

## 2016-03-30 ENCOUNTER — Ambulatory Visit (INDEPENDENT_AMBULATORY_CARE_PROVIDER_SITE_OTHER): Payer: Medicaid Other

## 2016-03-30 VITALS — BP 123/88 | HR 96 | Wt 296.0 lb

## 2016-03-30 DIAGNOSIS — Z3042 Encounter for surveillance of injectable contraceptive: Secondary | ICD-10-CM

## 2016-03-30 DIAGNOSIS — Z30013 Encounter for initial prescription of injectable contraceptive: Secondary | ICD-10-CM | POA: Diagnosis not present

## 2016-03-30 DIAGNOSIS — Z3202 Encounter for pregnancy test, result negative: Secondary | ICD-10-CM | POA: Diagnosis not present

## 2016-03-30 LAB — POCT URINE PREGNANCY: PREG TEST UR: NEGATIVE

## 2016-03-30 MED ORDER — MEDROXYPROGESTERONE ACETATE 150 MG/ML IM SUSP
150.0000 mg | Freq: Once | INTRAMUSCULAR | Status: AC
  Administered 2016-03-30: 150 mg via INTRAMUSCULAR

## 2016-03-30 NOTE — Progress Notes (Signed)
Patient presents for DEPO Injection.  UPT-NEGATIVE Shot given in Right Deltoid. Administrations This Visit    medroxyPROGESTERone (DEPO-PROVERA) injection 150 mg    Admin Date 03/30/2016 Action Given Dose 150 mg Route Intramuscular Administered By Maretta Beesarol J McGlashan, RMA

## 2016-04-04 ENCOUNTER — Telehealth: Payer: Self-pay

## 2016-04-04 NOTE — Telephone Encounter (Signed)
Returned call, patient stated that she has been experiencing cramps and some bleeding since depo restart on 03-30-16.  Patient denies changing pad every hour for bleeding and has been taking pain med for cramps. Advised that this can happen with the restart and to follow up if symptoms get worse.

## 2016-06-16 ENCOUNTER — Telehealth: Payer: Self-pay | Admitting: *Deleted

## 2016-06-16 NOTE — Telephone Encounter (Signed)
Pt called to office asking for return call.  Spoke with pt. Pt states that she is due for depo injection next week.  Pt state that she has known to expect some possible bleeding with depo. Pt states that she has been having heavy bleeding with small to now large clots.  Pt states that she is also having severe cramping.  Pt would like to know if she could get Rx for cramping. Pt is concerned about amount of bleeding and clots.  Pt states she is not saturating more that 1 pad per hour. Pt would like recommendations.    Please advise on bleeding and any Rx's.

## 2016-06-17 ENCOUNTER — Ambulatory Visit (INDEPENDENT_AMBULATORY_CARE_PROVIDER_SITE_OTHER): Payer: Medicaid Other

## 2016-06-17 ENCOUNTER — Other Ambulatory Visit: Payer: Self-pay | Admitting: Obstetrics

## 2016-06-17 VITALS — BP 144/93 | HR 93 | Wt 198.0 lb

## 2016-06-17 DIAGNOSIS — N946 Dysmenorrhea, unspecified: Secondary | ICD-10-CM

## 2016-06-17 DIAGNOSIS — Z3042 Encounter for surveillance of injectable contraceptive: Secondary | ICD-10-CM

## 2016-06-17 DIAGNOSIS — R1031 Right lower quadrant pain: Secondary | ICD-10-CM

## 2016-06-17 MED ORDER — IBUPROFEN 800 MG PO TABS: 800.0000 mg | ORAL_TABLET | Freq: Three times a day (TID) | ORAL | 5 refills | Status: DC | PRN

## 2016-06-17 MED ORDER — MEDROXYPROGESTERONE ACETATE 150 MG/ML IM SUSP
150.0000 mg | Freq: Once | INTRAMUSCULAR | Status: AC
  Administered 2016-06-17: 150 mg via INTRAMUSCULAR

## 2016-06-17 NOTE — Progress Notes (Signed)
Patient presents for DEPO. Given in right Deltoid. Tolerated well Next DEPO 8/3-17/2018 Administrations This Visit    medroxyPROGESTERone (DEPO-PROVERA) injection 150 mg    Admin Date 06/17/2016 Action Given Dose 150 mg Route Intramuscular Administered By Maretta BeesMcGlashan, Cyrah Mclamb J, RMA

## 2016-06-23 ENCOUNTER — Telehealth: Payer: Self-pay

## 2016-06-23 NOTE — Telephone Encounter (Signed)
Pt states that she was having some heavy bleeding and clotting. She was here at the office last week and did get the depo injection last Friday. States that she is still bleeding, but the clots have stopped. Pt would like to know if this is normal, or if there are any other recommendations.

## 2016-06-24 ENCOUNTER — Ambulatory Visit: Payer: Self-pay

## 2016-06-24 ENCOUNTER — Other Ambulatory Visit: Payer: Self-pay | Admitting: Obstetrics

## 2016-06-27 ENCOUNTER — Ambulatory Visit: Payer: Self-pay

## 2016-07-25 ENCOUNTER — Ambulatory Visit (INDEPENDENT_AMBULATORY_CARE_PROVIDER_SITE_OTHER): Payer: Medicaid Other | Admitting: Obstetrics

## 2016-07-25 ENCOUNTER — Encounter: Payer: Self-pay | Admitting: Obstetrics

## 2016-07-25 VITALS — BP 100/64 | HR 99 | Wt 298.0 lb

## 2016-07-25 DIAGNOSIS — Z3042 Encounter for surveillance of injectable contraceptive: Secondary | ICD-10-CM

## 2016-07-25 DIAGNOSIS — Z3009 Encounter for other general counseling and advice on contraception: Secondary | ICD-10-CM

## 2016-07-25 NOTE — Patient Instructions (Addendum)
Salpingectomy Salpingectomy, also called tubectomy, is the surgical removal of one of the fallopian tubes. The fallopian tubes are where eggs travel from the ovaries to the uterus. Removing one fallopian tube does not prevent you from becoming pregnant. It also does not cause problems with your menstrual periods. You may need a salpingectomy if you:  Have a fertilized egg that attaches to the fallopian tube (ectopic pregnancy), especially one that causes the tube to burst or tear (rupture).  Have an infected fallopian tube.  Have cancer of the fallopian tube or nearby organs.  Have had an ovary removed due to a cyst or tumor.  Have had your uterus removed.  There are three different methods that can be used for a salpingectomy:  Open. This method involves making one large incision in your abdomen.  Laparoscopic. This method involves using a thin, lighted tube with a tiny camera on the end (laparoscope) to help perform the procedure. The laparoscope will allow your surgeon to make several small incisions in the abdomen instead of a large incision.  Robot-assisted: This method involves using a computer to control surgical instruments that are attached to robotic arms.  Tell a health care provider about:  Any allergies you have.  All medicines you are taking, including vitamins, herbs, eye drops, creams, and over-the-counter medicines.  Any problems you or family members have had with anesthetic medicines.  Any blood disorders you have.  Any surgeries you have had.  Any medical conditions you have.  Whether you are pregnant or may be pregnant. What are the risks? Generally, this is a safe procedure. However, problems may occur, including:  Infection.  Bleeding.  Allergic reactions to medicines.  Damage to other structures or organs.  Blood clots in the legs or lungs.  What happens before the procedure? Staying hydrated Follow instructions from your health care provider  about hydration, which may include:  Up to 2 hours before the procedure - you may continue to drink clear liquids, such as water, clear fruit juice, black coffee, and plain tea.  Eating and drinking restrictions Follow instructions from your health care provider about eating and drinking, which may include:  8 hours before the procedure - stop eating heavy meals or foods such as meat, fried foods, or fatty foods.  6 hours before the procedure - stop eating light meals or foods, such as toast or cereal.  6 hours before the procedure - stop drinking milk or drinks that contain milk.  2 hours before the procedure - stop drinking clear liquids.  Medicines  Ask your health care provider about: ? Changing or stopping your regular medicines. This is especially important if you are taking diabetes medicines or blood thinners. ? Taking medicines such as aspirin and ibuprofen. These medicines can thin your blood. Do not take these medicines before your procedure if your health care provider instructs you not to.  You may be given antibiotic medicine to help prevent infection. General instructions  Do not smoke for at least 2 weeks before your procedure. If you need help quitting, ask your health care provider.  You may have an exam or tests, such as an electrocardiogram (ECG).  You may have a blood or urine sample taken.  Ask your health care provider: ? Whether you should stop removing hair from your surgical area. ? How your surgical site will be marked or identified.  You may be asked to shower with a germ-killing soap.  Plan to have someone take you home   from the hospital or clinic.  If you will be going home right after the procedure, plan to have someone with you for 24 hours. What happens during the procedure?  To reduce your risk of infection: ? Your health care team will wash or sanitize their hands. ? Hair may be removed from the surgical area. ? Your skin will be washed  with soap.  An IV tube will be inserted into one of your veins.  You will be given a medicine to make you fall asleep (general anesthetic). You may also be given a medicine to help you relax (sedative).  A thin tube (catheter) may be inserted through your urethra and into your bladder to drain urine during your procedure.  Depending on the type of procedure you are having, one incision or several small incisions will be made in your abdomen.  Your fallopian tube will be cut and removed from where it attaches to your uterus.  Your blood vessels will be clamped and tied to prevent excess bleeding.  The incision(s) in your abdomen will be closed with stitches (sutures), staples, or skin glue.  A bandage (dressing) may be placed over your incision(s). The procedure may vary among health care providers and hospitals. What happens after the procedure?  Your blood pressure, heart rate, breathing rate, and blood oxygen level will be monitored until the medicines you were given have worn off.  You may continue to receive fluids and medicines through an IV tube.  You may continue to have a catheter draining your urine.  You may have to wear compression stockings. These stockings help to prevent blood clots and reduce swelling in your legs.  You will be given pain medicine as needed.  Do not drive for 24 hours if you received a sedative. Summary  Salpingectomy is a surgical procedure to remove one of the fallopian tubes.  The procedure may be done with an open incision, with a laparoscope, or with computer-controlled instruments.  Depending on the type of procedure you are having, one incision or several small incisions will be made in your abdomen.  Your blood pressure, heart rate, breathing rate, and blood oxygen level will be monitored until the medicines you were given have worn off.  Plan to have someone take you home from the hospital or clinic. This information is not intended  to replace advice given to you by your health care provider. Make sure you discuss any questions you have with your health care provider. Document Released: 06/05/2008 Document Revised: 09/04/2015 Document Reviewed: 07/11/2012 Elsevier Interactive Patient Education  2018 [employer]  

## 2016-07-25 NOTE — Progress Notes (Signed)
Reviewed all forms of birth control options available including abstinence; over the counter/barrier methods; hormonal contraceptive medication including pill, patch, ring, injection,contraceptive implant; hormonal and nonhormonal IUDs; permanent sterilization options including vasectomy and the various tubal sterilization modalities. Risks and benefits reviewed.  Questions were answered.  Information was given to patient to review.   [name] A. Clearance CootsHarper MD 07-25-2016

## 2016-08-09 ENCOUNTER — Ambulatory Visit (INDEPENDENT_AMBULATORY_CARE_PROVIDER_SITE_OTHER): Payer: Medicaid Other | Admitting: Obstetrics & Gynecology

## 2016-08-09 ENCOUNTER — Encounter: Payer: Self-pay | Admitting: Obstetrics & Gynecology

## 2016-08-09 VITALS — BP 137/93 | HR 127 | Ht 64.0 in | Wt 289.3 lb

## 2016-08-09 DIAGNOSIS — Z3009 Encounter for other general counseling and advice on contraception: Secondary | ICD-10-CM | POA: Diagnosis not present

## 2016-08-09 DIAGNOSIS — N939 Abnormal uterine and vaginal bleeding, unspecified: Secondary | ICD-10-CM

## 2016-08-09 NOTE — Patient Instructions (Signed)
Laparoscopic Tubal Ligation Laparoscopic tubal ligation is a procedure to close the fallopian tubes. This is done so that you cannot get pregnant. When the fallopian tubes are closed, the eggs that your ovaries release cannot enter the uterus, and sperm cannot reach the released eggs. A laparoscopic tubal ligation is sometimes called "getting your tubes tied." You should not have this procedure if you want to get pregnant someday or if you are unsure about having more children. Tell a health care provider about:  Any allergies you have.  All medicines you are taking, including vitamins, herbs, eye drops, creams, and over-the-counter medicines.  Any problems you or family members have had with anesthetic medicines.  Any blood disorders you have.  Any surgeries you have had.  Any medical conditions you have.  Whether you are pregnant or may be pregnant.  Any past pregnancies. What are the risks? Generally, this is a safe procedure. However, problems may occur, including:  Infection.  Bleeding.  Injury to surrounding organs.  Side effects from anesthetics.  Failure of the procedure.  This procedure can increase your risk of a kind of pregnancy in which a fertilized egg attaches to the outside of the uterus (ectopic pregnancy). What happens before the procedure?  Ask your health care provider about: ? Changing or stopping your regular medicines. This is especially important if you are taking diabetes medicines or blood thinners. ? Taking medicines such as aspirin and ibuprofen. These medicines can thin your blood. Do not take these medicines before your procedure if your health care provider instructs you not to.  Follow instructions from your health care provider about eating and drinking restrictions.  Plan to have someone take you home after the procedure.  If you go home right after the procedure, plan to have someone with you for 24 hours. What happens during the  procedure?  You will be given one or more of the following: ? A medicine to help you relax (sedative). ? A medicine to numb the area (local anesthetic). ? A medicine to make you fall asleep (general anesthetic). ? A medicine that is injected into an area of your body to numb everything below the injection site (regional anesthetic).  An IV tube will be inserted into one of your veins. It will be used to give you medicines and fluids during the procedure.  Your bladder may be emptied with a small tube (catheter).  If you have been given a general anesthetic, a tube will be put down your throat to help you breathe.  Two small cuts (incisions) will be made in your lower abdomen and near your belly button.  Your abdomen will be inflated with a gas. This will let the surgeon see better and will give the surgeon room to work.  A thin, lighted tube (laparoscope) with a camera attached will be inserted into your abdomen through one of the incisions. Small instruments will be inserted through the other incision.  The fallopian tubes will be tied off, burned (cauterized), or blocked with a clip, ring, or clamp. A small portion in the center of each fallopian tube may be removed.  The gas will be released from the abdomen.  The incisions will be closed with stitches (sutures).  A bandage (dressing) will be placed over the incisions. The procedure may vary among health care providers and hospitals. What happens after the procedure?  Your blood pressure, heart rate, breathing rate, and blood oxygen level will be monitored often until the medicines you   were given have worn off.  You will be given medicine to help with pain, nausea, and vomiting as needed. This information is not intended to replace advice given to you by your health care provider. Make sure you discuss any questions you have with your health care provider. Document Released: 04/25/2000 Document Revised: 06/25/2015 Document  Reviewed: 12/28/2014 Elsevier Interactive Patient Education  2018 [employer]   Endometrial Ablation Endometrial ablation is a procedure that destroys the thin inner layer of the lining of the uterus (endometrium). This procedure may be done:  To stop heavy periods.  To stop bleeding that is causing anemia.  To control irregular bleeding.  To treat bleeding caused by small tumors (fibroids) in the endometrium.  This procedure is often an alternative to major surgery, such as removal of the uterus and cervix (hysterectomy). As a result of this procedure:  You may not be able to have children. However, if you are premenopausal (you have not gone through menopause): ? You may still have a small chance of getting pregnant. ? You will need to use a reliable method of birth control after the procedure to prevent pregnancy.  You may stop having a menstrual period, or you may have only a small amount of bleeding during your period. Menstruation may return several years after the procedure.  Tell a health care provider about:  Any allergies you have.  All medicines you are taking, including vitamins, herbs, eye drops, creams, and over-the-counter medicines.  Any problems you or family members have had with the use of anesthetic medicines.  Any blood disorders you have.  Any surgeries you have had.  Any medical conditions you have. What are the risks? Generally, this is a safe procedure. However, problems may occur, including:  A hole (perforation) in the uterus or bowel.  Infection of the uterus, bladder, or vagina.  Bleeding.  Damage to other structures or organs.  An air bubble in the lung (air embolus).  Problems with pregnancy after the procedure.  Failure of the procedure.  Decreased ability to diagnose cancer in the endometrium.  What happens before the procedure?  You will have tests of your endometrium to make sure there are no pre-cancerous cells or cancer  cells present.  You may have an ultrasound of the uterus.  You may be given medicines to thin the endometrium.  Ask your health care provider about: ? Changing or stopping your regular medicines. This is especially important if you take diabetes medicines or blood thinners. ? Taking medicines such as aspirin and ibuprofen. These medicines can thin your blood. Do not take these medicines before your procedure if your doctor tells you not to.  Plan to have someone take you home from the hospital or clinic. What happens during the procedure?  You will lie on an exam table with your feet and legs supported as in a pelvic exam.  To lower your risk of infection: ? Your health care team will wash or sanitize their hands and put on germ-free (sterile) gloves. ? Your genital area will be washed with soap.  An IV tube will be inserted into one of your veins.  You will be given a medicine to help you relax (sedative).  A surgical instrument with a light and camera (resectoscope) will be inserted into your vagina and moved into your uterus. This allows your surgeon to see inside your uterus.  Endometrial tissue will be removed using one of the following methods: ? Radiofrequency. This method uses  a radiofrequency-alternating electric current to remove the endometrium. ? Cryotherapy. This method uses extreme cold to freeze the endometrium. ? Heated-free liquid. This method uses a heated saltwater (saline) solution to destroy the endometrium (this is the one I will do) ? Microwave. This method uses high-energy microwaves to heat up the endometrium and remove it. ? Thermal balloon. This method involves inserting a catheter with a balloon tip into the uterus. The balloon tip is filled with heated fluid to remove the endometrium. The procedure may vary among health care providers and hospitals. What happens after the procedure?  Your blood pressure, heart rate, breathing rate, and blood oxygen level  will be monitored until the medicines you were given have worn off.  As tissue healing occurs, you may notice vaginal bleeding for 4-6 weeks after the procedure. You may also experience: ? Cramps. ? Thin, watery vaginal discharge that is light pink or brown in color. ? A need to urinate more frequently than usual. ? Nausea.  Do not drive for 24 hours if you were given a sedative.  Do not have sex or insert anything into your vagina until your health care provider approves. Summary  Endometrial ablation is done to treat the many causes of heavy menstrual bleeding.  The procedure may be done only after medications have been tried to control the bleeding.  Plan to have someone take you home from the hospital or clinic. This information is not intended to replace advice given to you by your health care provider. Make sure you discuss any questions you have with your health care provider. Document Released: 11/27/2003 Document Revised: 02/04/2016 Document Reviewed: 02/04/2016 Elsevier Interactive Patient Education  2017 [employer].

## 2016-08-09 NOTE — Progress Notes (Signed)
Pt wishes to discuss Salpingectomy. Pt c/o prolonged VB since Feb 2018; was given Depo to stop VB w/o relief. Last Depo given 06/17/16

## 2016-08-09 NOTE — Progress Notes (Signed)
GYNECOLOGY OFFICE CONSULT NOTE  History:  31 y.o. [id number] here today for surgical consult.  She was put on Depo Provera for AUB and contraception; has gained a lot of weight and AUB has worsened. Does not want another hormonal method, declines IUD. Desires surgical sterilization.  Also wants to discuss surgical management of AUB.  She denies any current abnormal vaginal discharge, bleeding, pelvic pain or other concerns.   Past Medical History:  Diagnosis Date  . Asthma   . Obesity   . PCOS (polycystic ovarian syndrome)     Past Surgical History:  Procedure Laterality Date  . LAPAROTOMY  06/18/2014   Procedure: LAPAROTOMY with removal of Left ectopic pregnancy;  Surgeon: Brock Badharles A Harper, MD;  Location: [facility];  Service: Gynecology;;  . LUNG SURGERY    . MOUTH SURGERY      The following portions of the patient's history were reviewed and updated as appropriate: allergies, current medications, past family history, past medical history, past social history, past surgical history and problem list.   Health Maintenance:  Normal pap and negative HRHPV on 12/02/2015.    Review of Systems:  Pertinent items noted in HPI and remainder of comprehensive ROS otherwise negative.   Objective:  Physical Exam BP (!) 137/93   Pulse (!) 127   Ht 5\' 4"  (1.626 m)   Wt 289 lb 4.8 oz (131.2 kg)   BMI 49.66 kg/m  CONSTITUTIONAL: Well-developed, well-nourished female in no acute distress.  HENT:  Normocephalic, atraumatic. External right and left ear normal. Oropharynx is clear and moist EYES: Conjunctivae and EOM are normal. Pupils are equal, round, and reactive to light. No scleral icterus.  NECK: Normal range of motion, supple, no masses SKIN: Skin is warm and dry. No rash noted. Not diaphoretic. No erythema. No pallor. NEUROLOGIC: Alert and oriented to person, place, and time. Normal reflexes, muscle tone coordination. No cranial nerve deficit noted. PSYCHIATRIC: Normal mood and affect. Normal  behavior. Normal judgment and thought content. CARDIOVASCULAR: Normal heart rate noted RESPIRATORY: Effort and breath sounds normal, no problems with respiration noted ABDOMEN: Soft, obese, no distention noted.   PELVIC: Deferred MUSCULOSKELETAL: Normal range of motion. No edema noted.  Assessment & Plan:  1. Abnormal uterine bleeding (AUB) Patient declines further hormonal management. Discussed surgical management options for abnormal uterine bleeding endometrial ablation (Novasure/Hydrothermal Ablation) or hysterectomy as definitive surgical management.  Discussed risks and benefits of each method.   Patient desires endometrial ablation.  Printed patient education handouts were given to the patient to review at home. Bleeding precautions reviewed.    2. Sterilization consult Patient desires bilateral tubal sterilization.  Other reversible forms of contraception were discussed with patient; she declines all other modalities. Discussed laparoscopic tubal sterilization using Filshie clips. Risks and benefits discussed in detail including but not limited to: risk of regret, permanence of method, bleeding, infection, injury to surrounding organs, difficulty of procedure due to habitus and need for additional procedures.  Failure risk of 1-2 % with increased risk of ectopic gestation if pregnancy occurs was also discussed with patient. Of note, patient only has one tube, already had salpingectomy for left tubal ectopic pregnancy in 2016.  Patient verbalized understanding of these risks and benefits and wants to proceed with sterilization with laparoscopic sterilization using Filshie clips.  Printed patient education handouts about the procedure was given to the patient to review at home. Medicaid papers signed today, patient understands that surgery will be scheduled at least 30 days after the day  papers are signed as per Medicaid guidelines.  In the meantime, patient will continue Depo Provera for  contraception prior to surgery.  She was told that she will be contacted by our surgical scheduler regarding the time and date of her procedures (Laparoscopic tubal sterilization and Hydrothermal Endometrial Ablation); routine preoperative instructions of having nothing to eat or drink after midnight on the day prior to surgery and also coming to the hospital 1 1/2 hours prior to her time of surgery were also emphasized.  She was told she may be called for a preoperative appointment about a week prior to surgery and will be given further preoperative instructions at that visit.  Routine postoperative instructions will be reviewed with the patient and her family in detail after surgery.   Routine preventative health maintenance measures emphasized. Please refer to After Visit Summary for other counseling recommendations.   Total face-to-face consultation time with patient: 30 minutes. Over 50% of encounter was spent on counseling and coordination of care.   Jaynie Collins, MD, FACOG Attending Obstetrician & Gynecologist, [facility] for [employer], [facility]

## 2016-08-16 ENCOUNTER — Encounter (HOSPITAL_COMMUNITY): Payer: Self-pay

## 2016-09-05 ENCOUNTER — Other Ambulatory Visit: Payer: Self-pay | Admitting: Obstetrics

## 2016-09-05 DIAGNOSIS — Z3042 Encounter for surveillance of injectable contraceptive: Secondary | ICD-10-CM

## 2016-09-06 ENCOUNTER — Telehealth: Payer: Self-pay | Admitting: *Deleted

## 2016-09-06 NOTE — Telephone Encounter (Signed)
Pt called to office stating she has appt this week for depo injection. Pt states she needs refill sent to pharmacy to bring depo this week.  Refill has been ordered. Pt aware.

## 2016-09-07 ENCOUNTER — Ambulatory Visit (INDEPENDENT_AMBULATORY_CARE_PROVIDER_SITE_OTHER): Payer: Medicaid Other

## 2016-09-07 DIAGNOSIS — Z3042 Encounter for surveillance of injectable contraceptive: Secondary | ICD-10-CM | POA: Diagnosis not present

## 2016-09-07 MED ORDER — MEDROXYPROGESTERONE ACETATE 150 MG/ML IM SUSP
150.0000 mg | Freq: Once | INTRAMUSCULAR | Status: AC
  Administered 2016-09-07: 150 mg via INTRAMUSCULAR

## 2016-09-07 NOTE — Progress Notes (Signed)
Pt here for depo. Pt tolerated well. Pt advised to schedule next depo for 3 months between 10/24- 11/7

## 2016-09-08 ENCOUNTER — Ambulatory Visit: Payer: Self-pay

## 2016-09-27 ENCOUNTER — Ambulatory Visit (HOSPITAL_COMMUNITY)
Admission: RE | Admit: 2016-09-27 | Payer: Medicaid Other | Source: Ambulatory Visit | Admitting: Obstetrics & Gynecology

## 2016-09-27 ENCOUNTER — Encounter (HOSPITAL_COMMUNITY): Admission: RE | Payer: Self-pay | Source: Ambulatory Visit

## 2016-09-27 SURGERY — LIGATION, FALLOPIAN TUBE, LAPAROSCOPIC
Anesthesia: Choice

## 2016-10-13 HISTORY — PX: TUBAL LIGATION: SHX77

## 2016-11-29 ENCOUNTER — Ambulatory Visit: Payer: Self-pay

## 2016-12-02 ENCOUNTER — Encounter: Payer: Self-pay | Admitting: Obstetrics and Gynecology

## 2016-12-02 ENCOUNTER — Other Ambulatory Visit (HOSPITAL_COMMUNITY)
Admission: RE | Admit: 2016-12-02 | Discharge: 2016-12-02 | Disposition: A | Discharge status: Home / Self Care | Payer: Medicaid Other | Source: Ambulatory Visit | Attending: Obstetrics and Gynecology | Admitting: Obstetrics and Gynecology

## 2016-12-02 ENCOUNTER — Ambulatory Visit (INDEPENDENT_AMBULATORY_CARE_PROVIDER_SITE_OTHER): Payer: Medicaid Other | Admitting: Obstetrics and Gynecology

## 2016-12-02 VITALS — BP 144/91 | HR 92 | Ht 65.0 in | Wt 266.6 lb

## 2016-12-02 DIAGNOSIS — Z0000 Encounter for general adult medical examination without abnormal findings: Secondary | ICD-10-CM

## 2016-12-02 DIAGNOSIS — Z01419 Encounter for gynecological examination (general) (routine) without abnormal findings: Secondary | ICD-10-CM | POA: Diagnosis not present

## 2016-12-02 NOTE — Patient Instructions (Signed)
Polycystic Ovarian Syndrome °Polycystic ovarian syndrome (PCOS) is a common hormonal disorder among women of reproductive age. In most women with PCOS, many small fluid-filled sacs (cysts) grow on the ovaries, and the cysts are not part of a normal menstrual cycle. PCOS can cause problems with your menstrual periods and make it difficult to get pregnant. It can also cause an increased risk of miscarriage with pregnancy. If it is not treated, PCOS can lead to serious health problems, such as diabetes and heart disease. °What are the causes? °The cause of PCOS is not known, but it may be the result of a combination of certain factors, such as: °· Irregular menstrual cycle. °· High levels of certain hormones (androgens). °· Problems with the hormone that helps to control blood sugar (insulin resistance). °· Certain genes. ° °What increases the risk? °This condition is more likely to develop in women who have a family history of PCOS. °What are the signs or symptoms? °Symptoms of PCOS may include: °· Multiple ovarian cysts. °· Infrequent periods or no periods. °· Periods that are too frequent or too heavy. °· Unpredictable periods. °· Inability to get pregnant (infertility) because of not ovulating. °· Increased growth of hair on the face, chest, stomach, back, thumbs, thighs, or toes. °· Acne or oily skin. Acne may develop during adulthood, and it may not respond to treatment. °· Pelvic pain. °· Weight gain or obesity. °· Patches of thickened and dark brown or black skin on the neck, arms, breasts, or thighs (acanthosis nigricans). °· Excess hair growth on the face, chest, abdomen, or upper thighs (hirsutism). ° °How is this diagnosed? °This condition is diagnosed based on: °· Your medical history. °· A physical exam, including a pelvic exam. Your health care provider may look for areas of increased hair growth on your skin. °· Tests, such as: °? Ultrasound. This may be used to examine the ovaries and the lining of the  uterus (endometrium) for cysts. °? Blood tests. These may be used to check levels of sugar (glucose), female hormone (testosterone), and female hormones (estrogen and progesterone) in your blood. ° °How is this treated? °There is no cure for PCOS, but treatment can help to manage symptoms and prevent more health problems from developing. Treatment varies depending on: °· Your symptoms. °· Whether you want to have a baby or whether you need birth control (contraception). ° °Treatment may include nutrition and lifestyle changes along with: °· Progesterone hormone to start a menstrual period. °· Birth control pills to help you have regular menstrual periods. °· Medicines to make you ovulate, if you want to get pregnant. °· Medicine to reduce excessive hair growth. °· Surgery, in severe cases. This may involve making small holes in one or both of your ovaries. This decreases the amount of testosterone that your body produces. ° °Follow these instructions at home: °· Take over-the-counter and prescription medicines only as told by your health care provider. °· Follow a healthy meal plan. This can help you reduce the effects of PCOS. °? Eat a healthy diet that includes lean proteins, complex carbohydrates, fresh fruits and vegetables, low-fat dairy products, and healthy fats. Make sure to eat enough fiber. °· If you are overweight, lose weight as told by your health care provider. °? Losing 10% of your body weight may improve symptoms. °? Your health care provider can determine how much weight loss is best for you and can help you lose weight safely. °· Keep all follow-up visits as told by   your health care provider. This is important. °Contact a health care provider if: °· Your symptoms do not get better with medicine. °· You develop new symptoms. °This information is not intended to replace advice given to you by your health care provider. Make sure you discuss any questions you have with your health care  provider. °Document Released: 05/13/2004 Document Revised: 09/15/2015 Document Reviewed: 07/05/2015 °Elsevier Interactive Patient Education © 2018 Else[employer] ° ° ° °Diet for Polycystic Ovarian Syndrome °Polycystic ovary syndrome (PCOS) is a disorder of the chemical messengers (hormones) that regulate menstruation. The condition causes important hormones to be out of balance. PCOS can: °· Make your periods irregular or stop. °· Cause cysts to develop on the ovaries. °· Make it difficult to get pregnant. °· Stop your body from responding to the effects of insulin (insulin resistance), which can lead to obesity and diabetes. ° °Changing what you eat can help manage PCOS and improve your health. It can help you lose weight and improve the way your body uses insulin. °What is my plan? °· Eat breakfast, lunch, and dinner plus two snacks every day. °· Include protein in each meal and snack. °· Choose whole grains instead of products made with refined flour. °· Eat a variety of foods. °· Exercise regularly as told by your health care provider. °What do I need to know about this eating plan? °If you are overweight or obese, pay attention to how many calories you eat. Cutting down on calories can help you lose weight. Work with your health care provider or dietitian to figure out how many calories you need each day. °What foods can I eat? °Grains °Whole grains, such as whole wheat. Whole-grain breads, crackers, cereals, and pasta. Unsweetened oatmeal, bulgur, barley, quinoa, or brown rice. Corn or whole-wheat flour tortillas. °Vegetables ° °Lettuce. Spinach. Peas. Beets. Cauliflower. Cabbage. Broccoli. Carrots. Tomatoes. Squash. Eggplant. Herbs. Peppers. Onions. Cucumbers. Brussels sprouts. °Fruits °Berries. Bananas. Apples. Oranges. Grapes. Papaya. Mango. Pomegranate. Kiwi. Grapefruit. Cherries. °Meats and Other Protein Sources °Lean proteins, such as fish, chicken, beans, eggs, and tofu. °Dairy °Low-fat dairy products,  such as skim milk, cheese sticks, and yogurt. °Beverages °Low-fat or fat-free drinks, such as water, low-fat milk, sugar-free drinks, and 100% fruit juice. °Condiments °Ketchup. Mustard. Barbecue sauce. Relish. Low-fat or fat-free mayonnaise. °Fats and Oils °Olive oil or canola oil. Walnuts and almonds. °The items listed above may not be a complete list of recommended foods or beverages. Contact your dietitian for more options. °What foods are not recommended? °Foods high in calories or fat. Fried foods. Sweets. Products made from refined white flour, including white bread, pastries, white rice, and pasta. °The items listed above may not be a complete list of foods and beverages to avoid. Contact your dietitian for more information. °This information is not intended to replace advice given to you by your health care provider. Make sure you discuss any questions you have with your health care provider. °Document Released: 05/11/2015 Document Revised: 06/25/2015 Document Reviewed: 01/29/2014 °Elsevier Interactive Patient Education © 2018 Else[employer] Inc. ° °Contraception Choices °Contraception (birth control) is the use of any methods or devices to prevent pregnancy. Below are some methods to help avoid pregnancy. °Hormonal methods °· Contraceptive implant. This is a thin, plastic tube containing progesterone hormone. It does not contain estrogen hormone. Your health care provider inserts the tube in the inner part of the upper arm. The tube can remain in place for up to 3 years. After 3 years, the implant must   be removed. The implant prevents the ovaries from releasing an egg (ovulation), thickens the cervical mucus to prevent sperm from entering the uterus, and thins the lining of the inside of the uterus. °· Progesterone-only injections. These injections are given every 3 months by your health care provider to prevent pregnancy. This synthetic progesterone hormone stops the ovaries from releasing eggs. It also  thickens cervical mucus and changes the uterine lining. This makes it harder for sperm to survive in the uterus. °· Birth control pills. These pills contain estrogen and progesterone hormone. They work by preventing the ovaries from releasing eggs (ovulation). They also cause the cervical mucus to thicken, preventing the sperm from entering the uterus. Birth control pills are prescribed by a health care provider. Birth control pills can also be used to treat heavy periods. °· Minipill. This type of birth control pill contains only the progesterone hormone. They are taken every day of each month and must be prescribed by your health care provider. °· Birth control patch. The patch contains hormones similar to those in birth control pills. It must be changed once a week and is prescribed by a health care provider. °· Vaginal ring. The ring contains hormones similar to those in birth control pills. It is left in the vagina for 3 weeks, removed for 1 week, and then a new one is put back in place. The patient must be comfortable inserting and removing the ring from the vagina. A health care provider's prescription is necessary. °· Emergency contraception. Emergency contraceptives prevent pregnancy after unprotected sexual intercourse. This pill can be taken right after sex or up to 5 days after unprotected sex. It is most effective the sooner you take the pills after having sexual intercourse. Most emergency contraceptive pills are available without a prescription. Check with your pharmacist. Do not use emergency contraception as your only form of birth control. °Barrier methods °· Female condom. This is a thin sheath (latex or rubber) that is worn over the penis during sexual intercourse. It can be used with spermicide to increase effectiveness. °· Female condom. This is a soft, loose-fitting sheath that is put into the vagina before sexual intercourse. °· Diaphragm. This is a soft, latex, dome-shaped barrier that must be  fitted by a health care provider. It is inserted into the vagina, along with a spermicidal jelly. It is inserted before intercourse. The diaphragm should be left in the vagina for 6 to 8 hours after intercourse. °· Cervical cap. This is a round, soft, latex or plastic cup that fits over the cervix and must be fitted by a health care provider. The cap can be left in place for up to 48 hours after intercourse. °· Sponge. This is a soft, circular piece of polyurethane foam. The sponge has spermicide in it. It is inserted into the vagina after wetting it and before sexual intercourse. °· Spermicides. These are chemicals that kill or block sperm from entering the cervix and uterus. They come in the form of creams, jellies, suppositories, foam, or tablets. They do not require a prescription. They are inserted into the vagina with an applicator before having sexual intercourse. The process must be repeated every time you have sexual intercourse. °Intrauterine contraception °· Intrauterine device (IUD). This is a T-shaped device that is put in a woman's uterus during a menstrual period to prevent pregnancy. There are 2 types: °? Copper IUD. This type of IUD is wrapped in copper wire and is placed inside the uterus. Copper makes the uterus and   fallopian tubes produce a fluid that kills sperm. It can stay in place for 10 years. °? Hormone IUD. This type of IUD contains the hormone progestin (synthetic progesterone). The hormone thickens the cervical mucus and prevents sperm from entering the uterus, and it also thins the uterine lining to prevent implantation of a fertilized egg. The hormone can weaken or kill the sperm that get into the uterus. It can stay in place for 3-5 years, depending on which type of IUD is used. °Permanent methods of contraception °· Female tubal ligation. This is when the woman's fallopian tubes are surgically sealed, tied, or blocked to prevent the egg from traveling to the uterus. °· Hysteroscopic  sterilization. This involves placing a small coil or insert into each fallopian tube. Your doctor uses a technique called hysteroscopy to do the procedure. The device causes scar tissue to form. This results in permanent blockage of the fallopian tubes, so the sperm cannot fertilize the egg. It takes about 3 months after the procedure for the tubes to become blocked. You must use another form of birth control for these 3 months. °· Female sterilization. This is when the female has the tubes that carry sperm tied off (vasectomy). This blocks sperm from entering the vagina during sexual intercourse. After the procedure, the man can still ejaculate fluid (semen). °Natural planning methods °· Natural family planning. This is not having sexual intercourse or using a barrier method (condom, diaphragm, cervical cap) on days the woman could become pregnant. °· Calendar method. This is keeping track of the length of each menstrual cycle and identifying when you are fertile. °· Ovulation method. This is avoiding sexual intercourse during ovulation. °· Symptothermal method. This is avoiding sexual intercourse during ovulation, using a thermometer and ovulation symptoms. °· Post-ovulation method. This is timing sexual intercourse after you have ovulated. °Regardless of which type or method of contraception you choose, it is important that you use condoms to protect against the transmission of sexually transmitted infections (STIs). Talk with your health care provider about which form of contraception is most appropriate for you. °This information is not intended to replace advice given to you by your health care provider. Make sure you discuss any questions you have with your health care provider. °Document Released: 01/17/2005 Document Revised: 06/25/2015 Document Reviewed: 07/12/2012 °Elsevier Interactive Patient Education © 2017 Elsevier Inc. ° °

## 2016-12-02 NOTE — Progress Notes (Signed)
Subjective:     Kaitlyn Walton is a 31 y.o. female [phone]  With BMI 44 who is here for a comprehensive physical exam. The patient reports no problems. Patient is sexually active using BTL for contraception, recently performed in September. Patient reports long standing history of irregular menses related to PCOS. Patient is being followed by PCP for HTN. Patient is without complaints. She denies abnormal pelvic pain or discharge.   Past Medical History:  Diagnosis Date  . Asthma   . Obesity   . PCOS (polycystic ovarian syndrome)    Past Surgical History:  Procedure Laterality Date  . LAPAROTOMY  06/18/2014   Procedure: LAPAROTOMY with removal of Left ectopic pregnancy;  Surgeon: Brock Badharles A Harper, MD;  Location: [facility];  Service: Gynecology;;  . LUNG SURGERY    . MOUTH SURGERY    . TUBAL LIGATION  10/13/2016   Family History  Problem Relation Age of Onset  . Diabetes Mother   . Cancer Maternal Aunt   . Breast cancer Maternal Aunt   . Hypertension Maternal Grandmother   . Hypertension Maternal Grandfather   . Cancer Maternal Grandfather   . Hypertension Paternal Grandmother   . Hypertension Paternal Grandfather   . Asthma Other   . GI problems Other   . Hyperlipidemia Other   . Obesity Other   . Stroke Other   . Sleep apnea Other   . Bipolar disorder Father   . Bipolar disorder Sister     Social History   Social History  . Marital status: Single    Spouse name: N/A  . Number of children: N/A  . Years of education: N/A   Occupational History  . Not on file.   Social History Main Topics  . Smoking status: Never Smoker  . Smokeless tobacco: Never Used  . Alcohol use No  . Drug use: No  . Sexual activity: Yes    Birth control/ protection: , Surgical   Other Topics Concern  . Not on file   Social History Narrative  . No narrative on file   Health Maintenance  Topic Date Due  . INFLUENZA VACCINE  08/31/2016  . PAP SMEAR  12/02/2018  . TETANUS/TDAP   06/29/2022  . HIV Screening  Completed       Review of Systems Pertinent items are noted in HPI.   Objective:  Blood pressure (!) 144/91, pulse 92, height 5\' 5"  (1.651 m), weight 266 lb 9.6 oz (120.9 kg).     GENERAL: Well-developed, well-nourished female in no acute distress.  HEENT: Normocephalic, atraumatic. Sclerae anicteric.  NECK: Supple. Normal thyroid.  LUNGS: Clear to auscultation bilaterally.  HEART: Regular rate and rhythm. BREASTS: Symmetric in size. No palpable masses or lymphadenopathy, skin changes, or nipple drainage. ABDOMEN: Soft, nontender, nondistended. Limited secondary to body habitus PELVIC: Normal external female genitalia. Vagina is pink and rugated.  Normal discharge. Normal appearing cervix. Bimanual exam limited secondary to body habitus EXTREMITIES: No cyanosis, clubbing, or edema, 2+ distal pulses.    Assessment:    Healthy female exam.      Plan:    Pap smear and STD screen collected per patient request Discussed the benefits of hormonal contraception to protect the endometrium in the setting of PCOS Discussed weight loss and healthy lifestyle measures for weigth loss, to help with PCOS and HTN Patient will be contacted with abnormal results RTC in 1 year or prn See After Visit Summary for Counseling Recommendations

## 2016-12-02 NOTE — Progress Notes (Signed)
Patient is in the office for annual, last pap 12-02-15, pt requests std testing.

## 2016-12-03 LAB — HEPATITIS B SURFACE ANTIGEN: Hepatitis B Surface Ag: NEGATIVE

## 2016-12-03 LAB — HIV ANTIBODY (ROUTINE TESTING W REFLEX): HIV SCREEN 4TH GENERATION: NONREACTIVE

## 2016-12-03 LAB — RPR: RPR Ser Ql: NONREACTIVE

## 2016-12-03 LAB — HEPATITIS C ANTIBODY: Hep C Virus Ab: <0.1 s/co ratio (ref 0.0–0.9)

## 2016-12-06 LAB — CYTOLOGY - PAP
CHLAMYDIA, DNA PROBE: NEGATIVE
Diagnosis: NEGATIVE
HPV: NOT DETECTED
NEISSERIA GONORRHEA: NEGATIVE
Trichomonas: NEGATIVE

## 2017-04-04 ENCOUNTER — Encounter: Payer: Self-pay | Admitting: Obstetrics

## 2017-04-04 ENCOUNTER — Ambulatory Visit: Payer: Medicaid Other | Admitting: Obstetrics

## 2017-04-04 ENCOUNTER — Telehealth: Payer: Self-pay

## 2017-04-04 VITALS — BP 130/88 | HR 103 | Wt 275.7 lb

## 2017-04-04 DIAGNOSIS — N946 Dysmenorrhea, unspecified: Secondary | ICD-10-CM

## 2017-04-04 DIAGNOSIS — Z6841 Body Mass Index (BMI) 40.0 and over, adult: Secondary | ICD-10-CM

## 2017-04-04 DIAGNOSIS — N939 Abnormal uterine and vaginal bleeding, unspecified: Secondary | ICD-10-CM | POA: Diagnosis not present

## 2017-04-04 MED ORDER — MEDROXYPROGESTERONE ACETATE 10 MG PO TABS: 10.0000 mg | ORAL_TABLET | Freq: Every day | ORAL | 0 refills | Status: DC

## 2017-04-04 MED ORDER — KETOROLAC TROMETHAMINE 60 MG/2ML IM SOLN
60.0000 mg | Freq: Once | INTRAMUSCULAR | Status: AC
  Administered 2017-04-04: 60 mg via INTRAMUSCULAR

## 2017-04-04 MED ORDER — IBUPROFEN 800 MG PO TABS: 800.0000 mg | ORAL_TABLET | Freq: Three times a day (TID) | ORAL | 5 refills | Status: DC | PRN

## 2017-04-04 NOTE — Progress Notes (Signed)
Office supply Toradol 60 mg given IM R upper outer quad w/o difficulty.

## 2017-04-04 NOTE — Telephone Encounter (Addendum)
Spoke with pt c/o irregular periods and cramping. LMP was in Aug. Pt [facility]. Informed pt since she hasn't had a period in Aug, it's normal to have unusually heavier periods. She denies changing pads every hr or less. Pt has no relief using OTC pain meds for cramping. Consulted with Dr. Debroah LoopArnold, pt needs an appt. Call transferred to appt desk.

## 2017-04-04 NOTE — Progress Notes (Signed)
Patient ID: Kaitlyn Walton, female   DOB: 11/01/1985, 32 y.o.   MRN: [id number]  Chief Complaint  Patient presents with  . AUB    heavy cycles since BTL in 10/2016  . Dysmenorrhea    taking ibuprofen 800 mg    HPI Kaitlyn Walton is a 32 y.o. female.  Heavy period with clots and severe cramping and backache for past 5 days.  She had [facility] Sept[facility].  This is her first period since the sterilization.  She was on Depo prior to the sterilization. HPI  Past Medical History:  Diagnosis Date  . Asthma   . Obesity   . PCOS (polycystic ovarian syndrome)     Past Surgical History:  Procedure Laterality Date  . LAPAROTOMY  06/18/2014   Procedure: LAPAROTOMY with removal of Left ectopic pregnancy;  Surgeon: Brock Badharles A Markham Dumlao, MD;  Location: [facility];  Service: Gynecology;;  . LUNG SURGERY    . MOUTH SURGERY    . TUBAL LIGATION  10/13/2016    Family History  Problem Relation Age of Onset  . Diabetes Mother   . Cancer Maternal Aunt   . Breast cancer Maternal Aunt   . Hypertension Maternal Grandmother   . Hypertension Maternal Grandfather   . Cancer Maternal Grandfather   . Hypertension Paternal Grandmother   . Hypertension Paternal Grandfather   . Asthma Other   . GI problems Other   . Hyperlipidemia Other   . Obesity Other   . Stroke Other   . Sleep apnea Other   . Bipolar disorder Father   . Bipolar disorder Sister     Social History Social History   Tobacco Use  . Smoking status: Never Smoker  . Smokeless tobacco: Never Used  Substance Use Topics  . Alcohol use: No    Alcohol/week: 0.0 oz  . Drug use: No    Allergies  Allergen Reactions  . Cefaclor Other (See Comments)    "ceclor"= childhood allergy  . Sulfonamide Derivatives Other (See Comments)    Sulfa= childhood allergy    Current Outpatient Medications  Medication Sig Dispense Refill  . ibuprofen (ADVIL,MOTRIN) 800 MG tablet Take 1 tablet (800 mg  total) by mouth every 8 (eight) hours as needed. 30 tablet 5  . phentermine (ADIPEX-P) 37.5 MG tablet Take 1 tablet by mouth daily. After breakfast  3  . albuterol (PROVENTIL HFA;VENTOLIN HFA) 108 (90 Base) MCG/ACT inhaler Inhale 2 puffs into the lungs every 6 (six) hours as needed for wheezing. For wheezing (Patient not taking: Reported on 04/04/2017) 18 g 2  . ibuprofen (ADVIL,MOTRIN) 800 MG tablet Take 1 tablet (800 mg total) by mouth every 8 (eight) hours as needed. 30 tablet 5  . medroxyPROGESTERone (PROVERA) 10 MG tablet Take 1 tablet (10 mg total) by mouth daily. 10 tablet 0  . MedroxyPROGESTERone Acetate 150 MG/ML SUSY INJECT 1ML INTO THE MUSCLE EVERY 3 MONTHS (Patient not taking: Reported on 12/02/2016) 1 Syringe 3  . Vitamin D, Ergocalciferol, (DRISDOL) 50000 units CAPS capsule 1 (ONE) CAPSULE ONCE A WEEK  6   Current Facility-Administered Medications  Medication Dose Route Frequency Provider Last Rate Last Dose  . medroxyPROGESTERone (DEPO-PROVERA) injection 150 mg  150 mg Intramuscular Q90 days Levie HeritageStinson, Jacob J, DO   150 mg at 09/01/15 1358    Review of Systems Review of Systems Constitutional: negative for fatigue and weight loss Respiratory: negative for cough and wheezing Cardiovascular: negative for chest pain, fatigue and  palpitations Gastrointestinal: negative for abdominal pain and change in bowel habits Genitourinary:  Positive for heavy and painful period Integument/breast: negative for nipple discharge Musculoskeletal:negative for myalgias Neurological: negative for gait problems and tremors Behavioral/Psych: negative for abusive relationship, depression Endocrine: negative for temperature intolerance      Blood pressure 130/88, pulse (!) 103, weight 275 lb 11.2 oz (125.1 kg), last menstrual period 03/30/2017.  Physical Exam Physical Exam:  Deferred because of heavy period  50% of 15 min visit spent on counseling and coordination of care.   Data Reviewed Office  notes from [facility]  Assessment and Plan:    1. Abnormal uterine bleeding (AUB) Rx: - US PELVIC COMPLETE WITH TRANSVAGINAL; Future - medroxyPROGESTERone (PROVERA) 10 MG tablet; Take 1 tablet (10 mg total) by mouth daily.  Dispense: 10 tablet; Refill: 0 - ibuprofen (ADVIL,MOTRIN) 800 MG tablet; Take 1 tablet (800 mg total) by mouth every 8 (eight) hours as needed.  Dispense: 30 tablet; Refill: 5  2. Dysmenorrhea Rx: - ibuprofen (ADVIL,MOTRIN) 800 MG tablet; Take 1 tablet (800 mg total) by mouth every 8 (eight) hours as needed.  Dispense: 30 tablet; Refill: 5  3. Class 3 severe obesity due to excess calories without serious comorbidity with body mass index (BMI) of 45.0 to 49.9 in adult [facility]) - recommended program that includes caloric reduction, exercise and behavioral modification for weight reduction     Plan    Follow up in 2 weeks  Orders Placed This Encounter  Procedures  . US PELVIC COMPLETE WITH TRANSVAGINAL    Standing Status:   Future    Standing Expiration Date:   06/05/2018    Order Specific Question:   Reason for Exam (SYMPTOM  OR DIAGNOSIS REQUIRED)    Answer:   AUB    Order Specific Question:   Preferred imaging location?    Answer:   [facility]   Meds ordered this encounter  Medications  . medroxyPROGESTERone (PROVERA) 10 MG tablet    Sig: Take 1 tablet (10 mg total) by mouth daily.    Dispense:  10 tablet    Refill:  0  . ibuprofen (ADVIL,MOTRIN) 800 MG tablet    Sig: Take 1 tablet (800 mg total) by mouth every 8 (eight) hours as needed.    Dispense:  30 tablet    Refill:  5     Brock Bad MD

## 2017-04-10 ENCOUNTER — Ambulatory Visit (HOSPITAL_COMMUNITY)
Admission: RE | Admit: 2017-04-10 | Discharge: 2017-04-10 | Disposition: A | Discharge status: Home / Self Care | Payer: Medicaid Other | Source: Ambulatory Visit | Attending: Obstetrics | Admitting: Obstetrics

## 2017-04-10 DIAGNOSIS — N939 Abnormal uterine and vaginal bleeding, unspecified: Secondary | ICD-10-CM | POA: Insufficient documentation

## 2017-04-10 DIAGNOSIS — R9389 Abnormal findings on diagnostic imaging of other specified body structures: Secondary | ICD-10-CM | POA: Insufficient documentation

## 2017-04-14 ENCOUNTER — Telehealth: Payer: Self-pay | Admitting: Pediatrics

## 2017-04-14 NOTE — Telephone Encounter (Signed)
Pt states ibuprofen is not helping pain anymore.  She states pain is on right side now.  She admits to US 04/10/2017 and has f/u appt 03/20 @ 0945.  She is requesting something different for pain.

## 2017-04-18 ENCOUNTER — Other Ambulatory Visit: Payer: Self-pay | Admitting: Obstetrics

## 2017-04-19 ENCOUNTER — Encounter: Payer: Self-pay | Admitting: Obstetrics

## 2017-04-19 ENCOUNTER — Ambulatory Visit (INDEPENDENT_AMBULATORY_CARE_PROVIDER_SITE_OTHER): Payer: Medicaid Other | Admitting: Obstetrics

## 2017-04-19 VITALS — BP 119/82 | HR 79 | Ht 65.0 in | Wt 268.1 lb

## 2017-04-19 DIAGNOSIS — N946 Dysmenorrhea, unspecified: Secondary | ICD-10-CM | POA: Diagnosis not present

## 2017-04-19 DIAGNOSIS — Z6841 Body Mass Index (BMI) 40.0 and over, adult: Secondary | ICD-10-CM | POA: Diagnosis not present

## 2017-04-19 DIAGNOSIS — N939 Abnormal uterine and vaginal bleeding, unspecified: Secondary | ICD-10-CM

## 2017-04-19 NOTE — Progress Notes (Signed)
Patient ID: Kaitlyn Walton, female   DOB: March 08, 1985, 32 y.o.   MRN: [id number]  Chief Complaint  Patient presents with  . Follow-up    HPI Kaitlyn Walton is a 32 y.o. female.  History of AUB-Hormonal Imbalance.  Problem has resolved after taking Provera. Complains of burning with urination.  The pelvic pain has also resolved.  Ultrasound was WNL's. HPI  Past Medical History:  Diagnosis Date  . Asthma   . Obesity   . PCOS (polycystic ovarian syndrome)     Past Surgical History:  Procedure Laterality Date  . LAPAROTOMY  06/18/2014   Procedure: LAPAROTOMY with removal of Left ectopic pregnancy;  Surgeon: Brock Bad, MD;  Location: [facility];  Service: Gynecology;;  . LUNG SURGERY    . MOUTH SURGERY    . TUBAL LIGATION  10/13/2016    Family History  Problem Relation Age of Onset  . Diabetes Mother   . Cancer Maternal Aunt   . Breast cancer Maternal Aunt   . Hypertension Maternal Grandmother   . Hypertension Maternal Grandfather   . Cancer Maternal Grandfather   . Hypertension Paternal Grandmother   . Hypertension Paternal Grandfather   . Asthma Other   . GI problems Other   . Hyperlipidemia Other   . Obesity Other   . Stroke Other   . Sleep apnea Other   . Bipolar disorder Father   . Bipolar disorder Sister     Social History Social History   Tobacco Use  . Smoking status: Never Smoker  . Smokeless tobacco: Never Used  Substance Use Topics  . Alcohol use: No    Alcohol/week: 0.0 oz  . Drug use: No    Allergies  Allergen Reactions  . Cefaclor Other (See Comments)    "ceclor"= childhood allergy  . Sulfonamide Derivatives Other (See Comments)    Sulfa= childhood allergy    Current Outpatient Medications  Medication Sig Dispense Refill  . phentermine (ADIPEX-P) 37.5 MG tablet Take 1 tablet by mouth daily. After breakfast  3  . albuterol (PROVENTIL HFA;VENTOLIN HFA) 108 (90 Base) MCG/ACT inhaler Inhale 2 puffs into the lungs every 6 (six)  hours as needed for wheezing. For wheezing (Patient not taking: Reported on 04/04/2017) 18 g 2  . ibuprofen (ADVIL,MOTRIN) 800 MG tablet Take 1 tablet (800 mg total) by mouth every 8 (eight) hours as needed. (Patient not taking: Reported on 04/19/2017) 30 tablet 5  . ibuprofen (ADVIL,MOTRIN) 800 MG tablet Take 1 tablet (800 mg total) by mouth every 8 (eight) hours as needed. (Patient not taking: Reported on 04/19/2017) 30 tablet 5  . medroxyPROGESTERone (PROVERA) 10 MG tablet Take 1 tablet (10 mg total) by mouth daily. (Patient not taking: Reported on 04/19/2017) 10 tablet 0  . MedroxyPROGESTERone Acetate 150 MG/ML SUSY INJECT INTO THE MUSCLE EVERY 3 MONTHS (Patient not taking: Reported on 12/02/2016) 1 Syringe 3  . Vitamin D, Ergocalciferol, (DRISDOL) 50000 units CAPS capsule 1 (ONE) CAPSULE ONCE A WEEK  6   Current Facility-Administered Medications  Medication Dose Route Frequency Provider Last Rate Last Dose  . medroxyPROGESTERone (DEPO-PROVERA) injection 150 mg  150 mg Intramuscular Q90 days [name], DO   150 mg at 09/01/15 1358    Review of Systems Review of Systems Constitutional: negative for fatigue and weight loss Respiratory: negative for cough and wheezing Cardiovascular: negative for chest pain, fatigue and palpitations Gastrointestinal: negative for abdominal pain and change in bowel habits Genitourinary:positive for pelvic pain and burning  with urination Integument/breast: negative for nipple discharge Musculoskeletal:negative for myalgias Neurological: negative for gait problems and tremors Behavioral/Psych: negative for abusive relationship, depression Endocrine: negative for temperature intolerance      Blood pressure 119/82, pulse 79, height 5\' 5"  (1.651 m), weight 268 lb 1.6 oz (121.6 kg), last menstrual period 03/30/2017.  Physical Exam Physical Exam:  Deferred  >50% of 15 min visit spent on counseling and coordination of care.   Data  Reviewed Ultrasound: US PELVIC COMPLETE WITH TRANSVAGINAL (Accession [id number]) (Order [id number])  Imaging  Date: 04/10/2017 [facility] Released By: Loyal JacobsonBowlin, Stacy B Authorizing: Brock BadHarper, Charles A, MD  Exam Information   Status Exam Begun  Exam Ended   Final [99] 04/10/2017 8:58 AM 04/10/2017 9:27 AM  PACS Images   Show images for US PELVIC COMPLETE WITH TRANSVAGINAL  Study Result   CLINICAL DATA:  Abnormal uterine bleeding  EXAM: TRANSABDOMINAL AND TRANSVAGINAL ULTRASOUND OF PELVIS  TECHNIQUE: Both transabdominal and transvaginal ultrasound examinations of the pelvis were performed. Transabdominal technique was performed for global imaging of the pelvis including uterus, ovaries, adnexal regions, and pelvic cul-de-sac. It was necessary to proceed with endovaginal exam following the transabdominal exam to visualize the uterus, endometrium, ovaries and adnexa.  COMPARISON:  Ultrasound 02/05/2016  FINDINGS: Uterus  Measurements: 8.9 x 4.2 x 6.2 cm. No fibroids or other mass visualized.  Endometrium  Thickness: 6 mm in thickness.  No focal abnormality visualized.  Right ovary  Measurements: 3.0 x 2.3 x 2.2 cm. Small 9 mm echogenic area within the right ovary could reflect small dermoid.  Left ovary  Measurements: 3.2 x 2.0 x 2.2 cm. Normal appearance/no adnexal mass.  Other findings  No abnormal free fluid.  IMPRESSION: Normal appearance of the uterus and endometrium. If bleeding remains unresponsive to hormonal or medical therapy, sonohysterogram should be considered for focal lesion work-up. (Ref: Radiological Reasoning: Algorithmic Workup of Abnormal Vaginal Bleeding with Endovaginal Sonography and Sonohysterography. AJR 2008; 952:W41-32; 191:S68-73)  New small echogenic area within the right ovary, 9 mm in greatest diameter. Question small dermoid.   Electronically Signed   By: Charlett NoseKevin  Dover M.D.    On: 04/10/2017 09:36    Assessment     1. Abnormal uterine bleeding (AUB) - resolved after Provera   2. Dysmenorrhea - stable on Ibuprofen  3. Class 3 severe obesity due to excess calories without serious comorbidity with body mass index (BMI) of 45.0 to 49.9 in adult [facility]HCC) - program of caloric reduction, exercise and behavioral modification recommended    Plan    FOLLOW UP IN 8 MONTHS FOR ANNUAL   No orders of the defined types were placed in this encounter.  No orders of the defined types were placed in this encounter.   Brock BadHARLES A. HARPER MD 04-19-2017

## 2017-04-19 NOTE — Progress Notes (Signed)
Patient is in the office for follow up for AUB, pt states she finished Provera and no bleeding has stopped.

## 2017-07-25 ENCOUNTER — Ambulatory Visit: Payer: Self-pay | Admitting: Obstetrics

## 2017-07-26 ENCOUNTER — Telehealth: Payer: Self-pay

## 2017-07-26 NOTE — Telephone Encounter (Signed)
Returned call, pt having irregular brownish colored bleeding since 07-14-17. Patient states she has had irregular bleeding most of her life; previous on Provera. Patient has appt on 07-31-17 with provider to discuss further.

## 2017-07-31 ENCOUNTER — Encounter: Payer: Self-pay | Admitting: Obstetrics

## 2017-07-31 ENCOUNTER — Ambulatory Visit: Payer: Medicaid Other | Admitting: Obstetrics

## 2017-07-31 ENCOUNTER — Other Ambulatory Visit (HOSPITAL_COMMUNITY)
Admission: RE | Admit: 2017-07-31 | Discharge: 2017-07-31 | Disposition: A | Discharge status: Home / Self Care | Payer: Medicaid Other | Source: Ambulatory Visit | Attending: Obstetrics | Admitting: Obstetrics

## 2017-07-31 VITALS — BP 126/82 | HR 108 | Wt 242.0 lb

## 2017-07-31 DIAGNOSIS — Z6841 Body Mass Index (BMI) 40.0 and over, adult: Secondary | ICD-10-CM | POA: Diagnosis not present

## 2017-07-31 DIAGNOSIS — N939 Abnormal uterine and vaginal bleeding, unspecified: Secondary | ICD-10-CM | POA: Diagnosis not present

## 2017-07-31 NOTE — Progress Notes (Signed)
RGYN patient presents for problem visit today.  Pt states she is still having  irregular periods. Pt noticed around 07/18/17 she noticed a brownish color /spotting  Cramping. 6/10x  Pt states she has never had  Brown color when starting period.   LMP: 07/16/2017  Pt states she had BTL 2018  Last pap: 12/02/2016

## 2017-07-31 NOTE — Progress Notes (Signed)
Patient ID: Kaitlyn Walton, female   DOB: [birth year], 32 y.o.   MRN: [id number]  Chief Complaint  Patient presents with  . Vaginal Bleeding    HPI Kaitlyn Walton is a 32 y.o. female.  History of irregular periods.  Stopped Dep last year.  Has not had normal periods since stopping Depo.  Has had irregular brownish spotting since 07-16-2017.  Denies cramping or vaginal discharge or irritation. HPI  Past Medical History:  Diagnosis Date  . Asthma   . Obesity   . PCOS (polycystic ovarian syndrome)     Past Surgical History:  Procedure Laterality Date  . LAPAROTOMY  06/18/2014   Procedure: LAPAROTOMY with removal of Left ectopic pregnancy;  Surgeon: Brock Bad, MD;  Location: [facility];  Service: Gynecology;;  . LUNG SURGERY    . MOUTH SURGERY    . TUBAL LIGATION  10/13/2016    Family History  Problem Relation Age of Onset  . Diabetes Mother   . Cancer Maternal Aunt   . Breast cancer Maternal Aunt   . Hypertension Maternal Grandmother   . Hypertension Maternal Grandfather   . Cancer Maternal Grandfather   . Hypertension Paternal Grandmother   . Hypertension Paternal Grandfather   . Asthma Other   . GI problems Other   . Hyperlipidemia Other   . Obesity Other   . Stroke Other   . Sleep apnea Other   . Bipolar disorder Father   . Bipolar disorder Sister     Social History Social History   Tobacco Use  . Smoking status: Never Smoker  . Smokeless tobacco: Never Used  Substance Use Topics  . Alcohol use: No    Alcohol/week: 0.0 oz  . Drug use: No    Allergies  Allergen Reactions  . Cefaclor Other (See Comments)    "ceclor"= childhood allergy  . Sulfonamide Derivatives Other (See Comments)    Sulfa= childhood allergy    Current Outpatient Medications  Medication Sig Dispense Refill  . albuterol (PROVENTIL HFA;VENTOLIN HFA) 108 (90 Base) MCG/ACT inhaler Inhale 2 puffs into the lungs every 6 (six) hours as needed for wheezing. For wheezing 18 g 2   . ibuprofen (ADVIL,MOTRIN) 800 MG tablet Take 1 tablet (800 mg total) by mouth every 8 (eight) hours as needed. 30 tablet 5  . phentermine (ADIPEX-P) 37.5 MG tablet Take 1 tablet by mouth daily. After breakfast  3  . Topiramate ER (TROKENDI XR) 50 MG CP24 TK ONE C PO D    . ibuprofen (ADVIL,MOTRIN) 800 MG tablet Take 1 tablet (800 mg total) by mouth every 8 (eight) hours as needed. (Patient not taking: Reported on 04/19/2017) 30 tablet 5  . medroxyPROGESTERone (PROVERA) 10 MG tablet Take 1 tablet (10 mg total) by mouth daily. (Patient not taking: Reported on 04/19/2017) 10 tablet 0  . MedroxyPROGESTERone Acetate 150 MG/ML SUSY INJECT INTO THE MUSCLE EVERY 3 MONTHS (Patient not taking: Reported on 12/02/2016) 1 Syringe 3  . Vitamin D, Ergocalciferol, (DRISDOL) 50000 units CAPS capsule 1 (ONE) CAPSULE ONCE A WEEK  6   Current Facility-Administered Medications  Medication Dose Route Frequency Provider Last Rate Last Dose  . medroxyPROGESTERone (DEPO-PROVERA) injection 150 mg  150 mg Intramuscular Q90 days [name], DO   150 mg at 09/01/15 1358    Review of Systems Review of Systems Constitutional: negative for fatigue and weight loss Respiratory: negative for cough and wheezing Cardiovascular: negative for chest pain, fatigue and palpitations Gastrointestinal: negative for abdominal  pain and change in bowel habits Genitourinary:POSITIVE for irregular periods Integument/breast: negative for nipple discharge Musculoskeletal:negative for myalgias Neurological: negative for gait problems and tremors Behavioral/Psych: negative for abusive relationship, depression Endocrine: negative for temperature intolerance      Blood pressure 126/82, pulse (!) 108, weight 242 lb (109.8 kg), last menstrual period 07/16/2017.  Physical Exam Physical Exam           General:  Alert and no distress Abdomen:  normal findings: no organomegaly, soft, non-tender and no hernia  Pelvis:  External  genitalia: normal general appearance Urinary system: urethral meatus normal and bladder without fullness, nontender Vaginal: normal without tenderness, induration or masses Cervix: normal appearance Adnexa: normal bimanual exam Uterus: anteverted and non-tender, normal size    50% of 15 min visit spent on counseling and coordination of care.   Data Reviewed Ultrasound  Assessment     1. Abnormal uterine bleeding (AUB) - ultrasound normal.  Patient does not want hormones - will follow clinically  2. Class 3 severe obesity due to excess calories without serious comorbidity with body mass index (BMI) of 45.0 to 49.9 in adult [facility](HCC) - program of caloric reduction, exercise and lifestyle modification recommended    Plan    Follow up in 3 months  No orders of the defined types were placed in this encounter.  No orders of the defined types were placed in this encounter.   Brock BadHARLES A. Ezekiel Menzer MD 07-31-2017

## 2017-08-01 LAB — CERVICOVAGINAL ANCILLARY ONLY
CHLAMYDIA, DNA PROBE: NEGATIVE
NEISSERIA GONORRHEA: NEGATIVE

## 2017-09-01 ENCOUNTER — Emergency Department (HOSPITAL_COMMUNITY)
Admission: EM | Admit: 2017-09-01 | Discharge: 2017-09-02 | Disposition: A | Discharge status: Home / Self Care | Payer: Medicaid Other | Attending: Emergency Medicine | Admitting: Emergency Medicine

## 2017-09-01 DIAGNOSIS — J45909 Unspecified asthma, uncomplicated: Secondary | ICD-10-CM | POA: Insufficient documentation

## 2017-09-01 DIAGNOSIS — Y999 Unspecified external cause status: Secondary | ICD-10-CM | POA: Insufficient documentation

## 2017-09-01 DIAGNOSIS — S29001A Unspecified injury of muscle and tendon of front wall of thorax, initial encounter: Secondary | ICD-10-CM | POA: Diagnosis present

## 2017-09-01 DIAGNOSIS — Z79899 Other long term (current) drug therapy: Secondary | ICD-10-CM | POA: Diagnosis not present

## 2017-09-01 DIAGNOSIS — Y939 Activity, unspecified: Secondary | ICD-10-CM | POA: Diagnosis not present

## 2017-09-01 DIAGNOSIS — Y92009 Unspecified place in unspecified non-institutional (private) residence as the place of occurrence of the external cause: Secondary | ICD-10-CM | POA: Insufficient documentation

## 2017-09-01 DIAGNOSIS — S20212A Contusion of left front wall of thorax, initial encounter: Secondary | ICD-10-CM | POA: Diagnosis not present

## 2017-09-01 DIAGNOSIS — R0789 Other chest pain: Secondary | ICD-10-CM

## 2017-09-01 NOTE — ED Notes (Signed)
Pt has daughter getting seen in Peds ED.

## 2017-09-02 ENCOUNTER — Emergency Department (HOSPITAL_COMMUNITY): Payer: Medicaid Other

## 2017-09-02 ENCOUNTER — Other Ambulatory Visit: Payer: Self-pay

## 2017-09-02 ENCOUNTER — Encounter (HOSPITAL_COMMUNITY): Payer: Self-pay | Admitting: *Deleted

## 2017-09-02 MED ORDER — ONDANSETRON 4 MG PO TBDP: 4.0000 mg | ORAL_TABLET | Freq: Three times a day (TID) | ORAL | 0 refills | Status: DC | PRN

## 2017-09-02 MED ORDER — IBUPROFEN 600 MG PO TABS: 600.0000 mg | ORAL_TABLET | Freq: Four times a day (QID) | ORAL | 0 refills | Status: DC | PRN

## 2017-09-02 MED ORDER — OXYCODONE-ACETAMINOPHEN 5-325 MG PO TABS: 1.0000 | ORAL_TABLET | ORAL | 0 refills | Status: DC | PRN

## 2017-09-02 MED ORDER — OXYCODONE-ACETAMINOPHEN 5-325 MG PO TABS
1.0000 | ORAL_TABLET | Freq: Once | ORAL | Status: AC
Start: 2017-09-02 — End: 2017-09-02
  Administered 2017-09-02: 1 via ORAL
  Filled 2017-09-02: qty 1
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
## 2017-09-02 NOTE — Discharge Instructions (Signed)
No rib fractures are identified on x-ray indicating there is soft tissue injury to the left chest which requires supportive care and will get better on its own. Warm compresses may help with comfort.
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
## 2017-12-12 ENCOUNTER — Emergency Department (HOSPITAL_COMMUNITY)
Admission: EM | Admit: 2017-12-12 | Discharge: 2017-12-12 | Disposition: A | Discharge status: Home / Self Care | Payer: Medicaid Other | Attending: Emergency Medicine | Admitting: Emergency Medicine

## 2017-12-12 ENCOUNTER — Other Ambulatory Visit: Payer: Self-pay

## 2017-12-12 ENCOUNTER — Encounter (HOSPITAL_COMMUNITY): Payer: Self-pay

## 2017-12-12 ENCOUNTER — Emergency Department (HOSPITAL_COMMUNITY): Payer: Medicaid Other

## 2017-12-12 DIAGNOSIS — J45909 Unspecified asthma, uncomplicated: Secondary | ICD-10-CM | POA: Diagnosis not present

## 2017-12-12 DIAGNOSIS — E282 Polycystic ovarian syndrome: Secondary | ICD-10-CM | POA: Insufficient documentation

## 2017-12-12 DIAGNOSIS — B9689 Other specified bacterial agents as the cause of diseases classified elsewhere: Secondary | ICD-10-CM | POA: Diagnosis not present

## 2017-12-12 DIAGNOSIS — N760 Acute vaginitis: Secondary | ICD-10-CM | POA: Diagnosis not present

## 2017-12-12 DIAGNOSIS — Z79899 Other long term (current) drug therapy: Secondary | ICD-10-CM | POA: Diagnosis not present

## 2017-12-12 DIAGNOSIS — R1031 Right lower quadrant pain: Secondary | ICD-10-CM | POA: Insufficient documentation

## 2017-12-12 DIAGNOSIS — R1030 Lower abdominal pain, unspecified: Secondary | ICD-10-CM | POA: Diagnosis present

## 2017-12-12 LAB — COMPREHENSIVE METABOLIC PANEL
ALBUMIN: 4.3 g/dL (ref 3.5–5.0)
ALK PHOS: 65 U/L (ref 38–126)
ALT: 28 U/L (ref 0–44)
AST: 25 U/L (ref 15–41)
Anion gap: 8 (ref 5–15)
BUN: 15 mg/dL (ref 6–20)
CHLORIDE: 104 mmol/L (ref 98–111)
CO2: 28 mmol/L (ref 22–32)
CREATININE: 0.67 mg/dL (ref 0.44–1.00)
Calcium: 9.4 mg/dL (ref 8.9–10.3)
GFR calc non Af Amer: >60 mL/min (ref >60)
GLUCOSE: 98 mg/dL (ref 70–99)
Potassium: 4.2 mmol/L (ref 3.5–5.1)
SODIUM: 140 mmol/L (ref 135–145)
Total Bilirubin: 0.3 mg/dL (ref 0.3–1.2)
Total Protein: 7.3 g/dL (ref 6.5–8.1)

## 2017-12-12 LAB — URINALYSIS, ROUTINE W REFLEX MICROSCOPIC
BACTERIA UA: NONE SEEN
Bilirubin Urine: NEGATIVE
Glucose, UA: NEGATIVE mg/dL
Hgb urine dipstick: NEGATIVE
Ketones, ur: NEGATIVE mg/dL
Leukocytes, UA: NEGATIVE
Nitrite: NEGATIVE
PROTEIN: NEGATIVE mg/dL
Specific Gravity, Urine: 1.005 (ref 1.005–1.030)
pH: 8 (ref 5.0–8.0)

## 2017-12-12 LAB — WET PREP, GENITAL
SPERM: NONE SEEN
TRICH WET PREP: NONE SEEN
YEAST WET PREP: NONE SEEN

## 2017-12-12 LAB — LIPASE, BLOOD: LIPASE: 84 U/L — AB (ref 11–51)

## 2017-12-12 LAB — CBC
HCT: 41.7 % (ref 36.0–46.0)
Hemoglobin: 13 g/dL (ref 12.0–15.0)
MCH: 28.8 pg (ref 26.0–34.0)
MCHC: 31.2 g/dL (ref 30.0–36.0)
MCV: 92.5 fL (ref 80.0–100.0)
NRBC: 0 % (ref 0.0–0.2)
Platelets: 359 10*3/uL (ref 150–400)
RBC: 4.51 MIL/uL (ref 3.87–5.11)
RDW: 12.4 % (ref 11.5–15.5)
WBC: 8.1 10*3/uL (ref 4.0–10.5)

## 2017-12-12 LAB — I-STAT BETA HCG BLOOD, ED (MC, WL, AP ONLY)

## 2017-12-12 MED ORDER — METRONIDAZOLE 500 MG PO TABS: 500.0000 mg | ORAL_TABLET | Freq: Two times a day (BID) | ORAL | 0 refills | Status: DC

## 2017-12-12 MED ORDER — FENTANYL CITRATE (PF) 100 MCG/2ML IJ SOLN
50.0000 ug | Freq: Once | INTRAMUSCULAR | Status: AC
  Administered 2017-12-12: 50 ug via INTRAVENOUS
  Filled 2017-12-12: qty 2

## 2017-12-12 MED ORDER — IOPAMIDOL (ISOVUE-300) INJECTION 61%
100.0000 mL | Freq: Once | INTRAVENOUS | Status: AC | PRN
  Administered 2017-12-12: 100 mL via INTRAVENOUS

## 2017-12-12 MED ORDER — IOPAMIDOL (ISOVUE-300) INJECTION 61%
INTRAVENOUS | Status: AC
  Filled 2017-12-12: qty 100

## 2017-12-12 MED ORDER — SODIUM CHLORIDE (PF) 0.9 % IJ SOLN
INTRAMUSCULAR | Status: AC
  Filled 2017-12-12: qty 50

## 2017-12-12 MED ORDER — TRAMADOL HCL 50 MG PO TABS: 50.0000 mg | ORAL_TABLET | Freq: Four times a day (QID) | ORAL | 0 refills | Status: DC | PRN

## 2017-12-12 NOTE — ED Notes (Signed)
Patient transported to CT 

## 2017-12-12 NOTE — ED Notes (Signed)
Pt given water and crackers and able to keep it down without any problems.
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
## 2017-12-12 NOTE — ED Provider Notes (Signed)
[facility]-EMERGENCY DEPT Provider Note   CSN: [id number] Arrival date & time: 12/12/17  1022     History   Chief Complaint Chief Complaint  Patient presents with  . Abdominal Pain    HPI Kaitlyn Walton is a 32 y.o. female.  Patient is a 32 year old female who presents with abdominal pain.  She reports a one-week history of some worsening pain across her lower abdomen.  She is had some urinary frequency but no burning on urination.  She denies any vaginal bleeding or discharge.  She does at times have some sharp pain in her right flank.  She denies any prior history of STDs.  She is sexually active.  No known fevers.  No nausea or vomiting.  [facility] was [facility] today and sent here for further evaluation.     Past Medical History:  Diagnosis Date  . Asthma   . Obesity   . PCOS (polycystic ovarian syndrome)     Patient Active Problem List   Diagnosis Date Noted  . Ectopic pregnancy, tubal 06/18/2014  . Obesity 07/27/2013  . PCOS (polycystic ovarian syndrome) 07/27/2013  . Abnormal uterine bleeding (AUB) 06/11/2013  . Asthma 06/07/2012  . Monilial rash 05/23/2012  . Asthma complicating pregnancy, antepartum 05/10/2012  . History of PCOS 02/20/2012    Past Surgical History:  Procedure Laterality Date  . LAPAROTOMY  06/18/2014   Procedure: LAPAROTOMY with removal of Left ectopic pregnancy;  Surgeon: Brock Badharles A Harper, MD;  Location: [facility] ORS;  Service: Gynecology;;  . LUNG SURGERY    . MOUTH SURGERY    . TUBAL LIGATION  10/13/2016     OB History    Gravida  5   Para  3   Term  3   Preterm  0   AB  1   Living  3     SAB  1   TAB  0   Ectopic  0   Multiple  0   Live Births  3            Home Medications    Prior to Admission medications   Medication Sig Start Date End Date Taking? Authorizing Provider  albuterol (PROVENTIL HFA;VENTOLIN HFA) 108 (90 Base) MCG/ACT inhaler Inhale 2  puffs into the lungs every 6 (six) hours as needed for wheezing. For wheezing 11/09/15  Yes Brock BadHarper, Charles A, MD  ibuprofen (ADVIL,MOTRIN) 800 MG tablet Take 800 mg by mouth every 8 (eight) hours as needed for fever or moderate pain.   Yes [provider]  Multiple Vitamin (MULTIVITAMIN WITH MINERALS) TABS tablet Take 1 tablet by mouth daily.   Yes [provider]  ibuprofen (ADVIL,MOTRIN) 600 MG tablet Take 1 tablet (600 mg total) by mouth every 6 (six) hours as needed. Patient not taking: Reported on 12/12/2017 09/02/17   Elpidio AnisUpstill, Shari, PA-C  medroxyPROGESTERone (PROVERA) 10 MG tablet Take 1 tablet (10 mg total) by mouth daily. Patient not taking: Reported on 04/19/2017 04/04/17   Brock BadHarper, Charles A, MD  MedroxyPROGESTERone Acetate 150 MG/ML SUSY INJECT 1ML INTO THE MUSCLE EVERY 3 MONTHS Patient not taking: Reported on 12/02/2016 09/06/16   Brock BadHarper, Charles A, MD  metroNIDAZOLE (FLAGYL) 500 MG tablet Take 1 tablet (500 mg total) by mouth 2 (two) times daily. One po bid x 7 days 12/12/17   Rolan BuccoBelfi, Ramil Edging[place], MD  ondansetron (ZOFRAN ODT) 4 MG disintegrating tablet Take 1 tablet (4 mg total) by mouth every 8 (eight) hours  as needed for nausea or vomiting. Patient not taking: Reported on 12/12/2017 09/02/17   Elpidio Anis, PA-C  oxyCODONE-acetaminophen (PERCOCET/ROXICET) 5-325 MG tablet Take 1 tablet by mouth every 4 (four) hours as needed for severe pain. Patient not taking: Reported on 12/12/2017 09/02/17   Elpidio Anis, PA-C  traMADol (ULTRAM) 50 MG tablet Take 1 tablet (50 mg total) by mouth every 6 (six) hours as needed. 12/12/17   Rolan Bucco, MD    Family History Family History  Problem Relation Age of Onset  . Diabetes Mother   . Cancer Maternal Aunt   . Breast cancer Maternal Aunt   . Hypertension Maternal Grandmother   . Hypertension Maternal Grandfather   . Cancer Maternal Grandfather   . Hypertension Paternal Grandmother   . Hypertension Paternal Grandfather   . Asthma  Other   . GI problems Other   . Hyperlipidemia Other   . Obesity Other   . Stroke Other   . Sleep apnea Other   . Bipolar disorder Father   . Bipolar disorder Sister     Social History Social History   Tobacco Use  . Smoking status: Never Smoker  . Smokeless tobacco: Never Used  Substance Use Topics  . Alcohol use: No    Alcohol/week: 0.0 standard drinks  . Drug use: No     Allergies   Cefaclor and Sulfonamide derivatives   Review of Systems Review of Systems  Constitutional: Negative for chills, diaphoresis, fatigue and fever.  HENT: Negative for congestion, rhinorrhea and sneezing.   Eyes: Negative.   Respiratory: Negative for cough, chest tightness and shortness of breath.   Cardiovascular: Negative for chest pain and leg swelling.  Gastrointestinal: Positive for abdominal pain. Negative for blood in stool, diarrhea, nausea and vomiting.  Genitourinary: Positive for frequency. Negative for difficulty urinating, flank pain and hematuria.  Musculoskeletal: Negative for arthralgias and back pain.  Skin: Negative for rash.  Neurological: Negative for dizziness, speech difficulty, weakness, numbness and headaches.     Physical Exam Updated Vital Signs BP 127/75   Pulse 77   Temp 98.7 F (37.1 C) (Oral)   Resp 16   Wt 120.7 kg   SpO2 99%   BMI 44.26 kg/m   Physical Exam  Constitutional: She is oriented to person, place, and time. She appears well-developed and well-nourished.  HENT:  Head: Normocephalic and atraumatic.  Eyes: Pupils are equal, round, and reactive to light.  Neck: Normal range of motion. Neck supple.  Cardiovascular: Normal rate, regular rhythm and normal heart sounds.  Pulmonary/Chest: Effort normal and breath sounds normal. No respiratory distress. She has no wheezes. She has no rales. She exhibits no tenderness.  Abdominal: Soft. Bowel sounds are normal. There is tenderness in the right lower quadrant and suprapubic area. There is no  rebound and no guarding.  Genitourinary:  Genitourinary Comments: Moderate amount of thin white discharge, no cervical motion tenderness or adnexal tenderness although exam is slightly limited due to patient's size.  Musculoskeletal: Normal range of motion. She exhibits no edema.  Lymphadenopathy:    She has no cervical adenopathy.  Neurological: She is alert and oriented to person, place, and time.  Skin: Skin is warm and dry. No rash noted.  Psychiatric: She has a normal mood and affect.     ED Treatments / Results  Labs (all labs ordered are listed, but only abnormal results are displayed) Labs Reviewed  WET PREP, GENITAL - Abnormal; Notable for the following components:  Result Value   Clue Cells Wet Prep HPF POC PRESENT (*)    WBC, Wet Prep HPF POC MANY (*)    All other components within normal limits  LIPASE, BLOOD - Abnormal; Notable for the following components:   Lipase 84 (*)    All other components within normal limits  URINALYSIS, ROUTINE W REFLEX MICROSCOPIC - Abnormal; Notable for the following components:   Color, Urine COLORLESS (*)    All other components within normal limits  COMPREHENSIVE METABOLIC PANEL  CBC  I-STAT BETA HCG BLOOD, ED ([facility], [facility], [facility] ONLY)  GC/CHLAMYDIA PROBE AMP ([place]) NOT AT [facility]    EKG None  Radiology US Transvaginal Non-ob  Result Date: 12/12/2017 CLINICAL DATA:  Right-sided pelvic pain for 1 week. Polycystic ovary syndrome. Clinical suspicion for ovarian torsion. EXAM: TRANSABDOMINAL AND TRANSVAGINAL ULTRASOUND OF PELVIS DOPPLER ULTRASOUND OF OVARIES TECHNIQUE: Both transabdominal and transvaginal ultrasound examinations of the pelvis were performed. Transabdominal technique was performed for global imaging of the pelvis including uterus, ovaries, adnexal regions, and pelvic cul-de-sac. It was necessary to proceed with endovaginal exam following the transabdominal exam to visualize the endometrium and ovaries. Color and duplex  Doppler ultrasound was utilized to evaluate blood flow to the ovaries. COMPARISON:  02/05/2016 FINDINGS: Uterus Measurements: 9.7 x 3.8 x 5.0 cm = volume: 97 mL. No fibroids or other mass visualized. Endometrium Thickness: 5 mm.  No focal abnormality visualized. Right ovary Measurements: 3.1 x 2.9 x 2.8 cm = volume: 13.0 mL. Normal appearance/no adnexal mass.m Left ovary Measurements: 3.5 x 1.9 x 2.7 cm = volume: 9.7 mL. Normal appearance/no adnexal mass. Pulsed Doppler evaluation of both ovaries demonstrates normal low-resistance arterial and venous waveforms. Other findings No abnormal free fluid. IMPRESSION: No pelvic mass or other significant abnormality identified. No sonographic evidence for ovarian torsion. Electronically Signed   By: Myles Rosenthal M.D.   On: 12/12/2017 15:30   US Pelvis Complete  Result Date: 12/12/2017 CLINICAL DATA:  Right-sided pelvic pain for 1 week. Polycystic ovary syndrome. Clinical suspicion for ovarian torsion. EXAM: TRANSABDOMINAL AND TRANSVAGINAL ULTRASOUND OF PELVIS DOPPLER ULTRASOUND OF OVARIES TECHNIQUE: Both transabdominal and transvaginal ultrasound examinations of the pelvis were performed. Transabdominal technique was performed for global imaging of the pelvis including uterus, ovaries, adnexal regions, and pelvic cul-de-sac. It was necessary to proceed with endovaginal exam following the transabdominal exam to visualize the endometrium and ovaries. Color and duplex Doppler ultrasound was utilized to evaluate blood flow to the ovaries. COMPARISON:  02/05/2016 FINDINGS: Uterus Measurements: 9.7 x 3.8 x 5.0 cm = volume: 97 mL. No fibroids or other mass visualized. Endometrium Thickness: 5 mm.  No focal abnormality visualized. Right ovary Measurements: 3.1 x 2.9 x 2.8 cm = volume: 13.0 mL. Normal appearance/no adnexal mass.m Left ovary Measurements: 3.5 x 1.9 x 2.7 cm = volume: 9.7 mL. Normal appearance/no adnexal mass. Pulsed Doppler evaluation of both ovaries demonstrates  normal low-resistance arterial and venous waveforms. Other findings No abnormal free fluid. IMPRESSION: No pelvic mass or other significant abnormality identified. No sonographic evidence for ovarian torsion. Electronically Signed   By: Myles Rosenthal M.D.   On: 12/12/2017 15:30   Ct Abdomen Pelvis W Contrast  Result Date: 12/12/2017 CLINICAL DATA:  Acute generalized abdominal pain EXAM: CT ABDOMEN AND PELVIS WITH CONTRAST TECHNIQUE: Multidetector CT imaging of the abdomen and pelvis was performed using the standard protocol following bolus administration of intravenous contrast. CONTRAST:  100 cc ISOVUE-300 IOPAMIDOL (ISOVUE-300) INJECTION 61% COMPARISON:  09/23/2015 FINDINGS: Lower chest:  Normal heart size without pericardial effusion. Minimal subsegmental atelectasis at the lung bases. No effusion or pneumothorax. No dominant mass. No pulmonary consolidations. Hepatobiliary: No focal liver abnormality is seen. No gallstones, gallbladder wall thickening, or biliary dilatation. Pancreas: Unremarkable. No pancreatic ductal dilatation or surrounding inflammatory changes. Spleen: Normal in size without focal abnormality. Adrenals/Urinary Tract: Adrenal glands are unremarkable. Punctate interpolar left renal calculus without hydroureteronephrosis. Ureters are unremarkable and nondistended. No focal mural thickening or bladder calculus. The bladder is physiologically distended. Stomach/Bowel: Stomach is within normal limits. Appendix appears normal. No evidence of bowel wall thickening, distention, or inflammatory changes. Vascular/Lymphatic: No significant vascular findings are present. No enlarged abdominal or pelvic lymph nodes. Reproductive: Uterus and bilateral adnexa are unremarkable. Other: Tiny periumbilical fat containing hernia. Musculoskeletal: No acute or significant osseous findings. IMPRESSION: 1. Punctate nonobstructing interpolar left renal calculus. 2. No acute bowel obstruction or inflammation. 3. No  acute solid organ pathology identified. Electronically Signed   By: Tollie Eth M.D.   On: 12/12/2017 13:53   [place] Art/ven Flow Abd Pelv Doppler  Result Date: 12/12/2017 CLINICAL DATA:  Right-sided pelvic pain for 1 week. Polycystic ovary syndrome. Clinical suspicion for ovarian torsion. EXAM: TRANSABDOMINAL AND TRANSVAGINAL ULTRASOUND OF PELVIS DOPPLER ULTRASOUND OF OVARIES TECHNIQUE: Both transabdominal and transvaginal ultrasound examinations of the pelvis were performed. Transabdominal technique was performed for global imaging of the pelvis including uterus, ovaries, adnexal regions, and pelvic cul-de-sac. It was necessary to proceed with endovaginal exam following the transabdominal exam to visualize the endometrium and ovaries. Color and duplex Doppler ultrasound was utilized to evaluate blood flow to the ovaries. COMPARISON:  02/05/2016 FINDINGS: Uterus Measurements: 9.7 x 3.8 x 5.0 cm = volume: 97 mL. No fibroids or other mass visualized. Endometrium Thickness: 5 mm.  No focal abnormality visualized. Right ovary Measurements: 3.1 x 2.9 x 2.8 cm = volume: 13.0 mL. Normal appearance/no adnexal mass.m Left ovary Measurements: 3.5 x 1.9 x 2.7 cm = volume: 9.7 mL. Normal appearance/no adnexal mass. Pulsed Doppler evaluation of both ovaries demonstrates normal low-resistance arterial and venous waveforms. Other findings No abnormal free fluid. IMPRESSION: No pelvic mass or other significant abnormality identified. No sonographic evidence for ovarian torsion. Electronically Signed   By: Myles Rosenthal M.D.   On: 12/12/2017 15:30    Procedures Procedures (including critical care time)  Medications Ordered in ED Medications  sodium chloride (PF) 0.9 % injection (has no administration in time range)  fentaNYL (SUBLIMAZE) injection 50 mcg (50 mcg Intravenous Given 12/12/17 1249)  iopamidol (ISOVUE-300) 61 % injection 100 mL (100 mLs Intravenous Contrast Given 12/12/17 1328)     Initial Impression /  Assessment and Plan / ED Course  I have reviewed the triage vital signs and the nursing notes.  Pertinent labs & imaging results that were available during my care of the patient were reviewed by me and considered in my medical decision making (see chart for details).     Patient is a 32 year old female who presents with abdominal pain.  Its mostly in the right lower quadrant.  Her pelvic exam was unremarkable other than some slight white discharge.  Therefore a CT scan was performed which showed no evidence of acute abnormality.  She does not have tenderness over her gallbladder.  She did have some ongoing pain in her lower pelvic area.  I did a pelvic ultrasound which did not show any acute abnormality.  Her pain is well controlled in the ED.  Her repeat abdominal exam is benign.  Her labs are non-concerning.  Her lipase is minimally elevated but she does not have any tenderness over her epigastrium.  She also has no associated vomiting.  She does not have evidence of urinary tract infection.  She does have evidence of bacterial vaginosis and I will treat her with Flagyl.  She will follow-up with her OB/GYN, Dr. Clearance Coots.  Return precautions were given.  Final Clinical Impressions(s) / ED Diagnoses   Final diagnoses:  Right lower quadrant abdominal pain  BV (bacterial vaginosis)    ED Discharge Orders         Ordered    traMADol (ULTRAM) 50 MG tablet  Every 6 hours PRN     12/12/17 1551    metroNIDAZOLE (FLAGYL) 500 MG tablet  2 times daily     12/12/17 1551           Rolan Bucco, MD 12/12/17 1554

## 2017-12-12 NOTE — ED Notes (Addendum)
Urine culture sent to lab with UA sample 

## 2017-12-13 LAB — GC/CHLAMYDIA PROBE AMP (~~LOC~~) NOT AT ARMC
Chlamydia: NEGATIVE
Neisseria Gonorrhea: NEGATIVE

## 2017-12-14 ENCOUNTER — Encounter: Payer: Self-pay | Admitting: Obstetrics

## 2017-12-14 ENCOUNTER — Ambulatory Visit (INDEPENDENT_AMBULATORY_CARE_PROVIDER_SITE_OTHER): Payer: Medicaid Other | Admitting: Obstetrics

## 2017-12-14 VITALS — BP 119/65 | HR 79 | Ht 65.0 in | Wt 272.6 lb

## 2017-12-14 DIAGNOSIS — N946 Dysmenorrhea, unspecified: Secondary | ICD-10-CM

## 2017-12-14 DIAGNOSIS — R102 Pelvic and perineal pain: Secondary | ICD-10-CM

## 2017-12-14 DIAGNOSIS — K581 Irritable bowel syndrome with constipation: Secondary | ICD-10-CM

## 2017-12-14 DIAGNOSIS — Z6841 Body Mass Index (BMI) 40.0 and over, adult: Secondary | ICD-10-CM

## 2017-12-14 MED ORDER — IBUPROFEN 800 MG PO TABS: 800.0000 mg | ORAL_TABLET | Freq: Three times a day (TID) | ORAL | 5 refills | Status: DC | PRN

## 2017-12-14 MED ORDER — DICYCLOMINE HCL 20 MG PO TABS: 20.0000 mg | ORAL_TABLET | Freq: Four times a day (QID) | ORAL | 2 refills | Status: DC

## 2017-12-14 NOTE — Progress Notes (Signed)
Patient ID: Kaitlyn Walton, female   DOB: 1985/04/30, 32 y.o.   MRN: [id number]  Chief Complaint  Patient presents with  . Follow-up    ED visit    HPI Kaitlyn Walton is a 32 y.o. female.  Complains of pelvic pain.  Recently evaluated in the ER and work - up was negative except for BV.  She also has a history of constipation. HPI  Past Medical History:  Diagnosis Date  . Asthma   . Obesity   . PCOS (polycystic ovarian syndrome)     Past Surgical History:  Procedure Laterality Date  . LAPAROTOMY  06/18/2014   Procedure: LAPAROTOMY with removal of Left ectopic pregnancy;  Surgeon: Brock Bad, MD;  Location: [facility] ORS;  Service: Gynecology;;  . LUNG SURGERY    . MOUTH SURGERY    . TUBAL LIGATION  10/13/2016    Family History  Problem Relation Age of Onset  . Diabetes Mother   . Cancer Maternal Aunt   . Breast cancer Maternal Aunt   . Hypertension Maternal Grandmother   . Hypertension Maternal Grandfather   . Cancer Maternal Grandfather   . Hypertension Paternal Grandmother   . Hypertension Paternal Grandfather   . Asthma Other   . GI problems Other   . Hyperlipidemia Other   . Obesity Other   . Stroke Other   . Sleep apnea Other   . Bipolar disorder Father   . Bipolar disorder Sister     Social History Social History   Tobacco Use  . Smoking status: Never Smoker  . Smokeless tobacco: Never Used  Substance Use Topics  . Alcohol use: No    Alcohol/week: 0.0 standard drinks  . Drug use: No    Allergies  Allergen Reactions  . Cefaclor Other (See Comments)    "ceclor"= childhood allergy  . Sulfonamide Derivatives Other (See Comments)    Sulfa= childhood allergy    Current Outpatient Medications  Medication Sig Dispense Refill  . albuterol (PROVENTIL HFA;VENTOLIN HFA) 108 (90 Base) MCG/ACT inhaler Inhale 2 puffs into the lungs every 6 (six) hours as needed for wheezing. For wheezing 18 g 2  . dicyclomine (BENTYL) 20 MG tablet Take 1 tablet  (20 mg total) by mouth every 6 (six) hours. 90 tablet 2  . ibuprofen (ADVIL,MOTRIN) 600 MG tablet Take 1 tablet (600 mg total) by mouth every 6 (six) hours as needed. (Patient not taking: Reported on 12/12/2017) 30 tablet 0  . ibuprofen (ADVIL,MOTRIN) 800 MG tablet Take 800 mg by mouth every 8 (eight) hours as needed for fever or moderate pain.    Marland Kitchen ibuprofen (ADVIL,MOTRIN) 800 MG tablet Take 1 tablet (800 mg total) by mouth every 8 (eight) hours as needed. 30 tablet 5  . medroxyPROGESTERone (PROVERA) 10 MG tablet Take 1 tablet (10 mg total) by mouth daily. (Patient not taking: Reported on 04/19/2017) 10 tablet 0  . MedroxyPROGESTERone Acetate 150 MG/ML SUSY INJECT INTO THE MUSCLE EVERY 3 MONTHS (Patient not taking: Reported on 12/02/2016) 1 Syringe 3  . metroNIDAZOLE (FLAGYL) 500 MG tablet Take 1 tablet (500 mg total) by mouth 2 (two) times daily. One po bid x 7 days 14 tablet 0  . Multiple Vitamin (MULTIVITAMIN WITH MINERALS) TABS tablet Take 1 tablet by mouth daily.    . ondansetron (ZOFRAN ODT) 4 MG disintegrating tablet Take 1 tablet (4 mg total) by mouth every 8 (eight) hours as needed for nausea or vomiting. (Patient not taking: Reported on 12/12/2017) 20  tablet 0  . oxyCODONE-acetaminophen (PERCOCET/ROXICET) 5-325 MG tablet Take 1 tablet by mouth every 4 (four) hours as needed for severe pain. (Patient not taking: Reported on 12/12/2017) 6 tablet 0  . traMADol (ULTRAM) 50 MG tablet Take 1 tablet (50 mg total) by mouth every 6 (six) hours as needed. (Patient not taking: Reported on 12/14/2017) 15 tablet 0   Current Facility-Administered Medications  Medication Dose Route Frequency Provider Last Rate Last Dose  . medroxyPROGESTERone (DEPO-PROVERA) injection 150 mg  150 mg Intramuscular Q90 days Levie HeritageStinson, Jacob J, DO   150 mg at 09/01/15 1358    Review of Systems Review of Systems Constitutional: negative for fatigue and weight loss Respiratory: negative for cough and  wheezing Cardiovascular: negative for chest pain, fatigue and palpitations Gastrointestinal: negative for abdominal pain and change in bowel habits Genitourinary:negative Integument/breast: negative for nipple discharge Musculoskeletal:negative for myalgias Neurological: negative for gait problems and tremors Behavioral/Psych: negative for abusive relationship, depression Endocrine: negative for temperature intolerance      Blood pressure 119/65, pulse 79, height 5\' 5"  (1.651 m), weight 272 lb 9.6 oz (123.7 kg), last menstrual period 11/28/2017.  Physical Exam Physical Exam General:   alert  Skin:   no rash or abnormalities  Lungs:   clear to auscultation bilaterally  Heart:   regular rate and rhythm, S1, S2 normal, no murmur, click, rub or gallop  Breasts:   normal without suspicious masses, skin or nipple changes or axillary nodes  Abdomen:  normal findings: no organomegaly, soft, non-tender and no hernia  Pelvis:  External genitalia: normal general appearance Urinary system: urethral meatus normal and bladder without fullness, nontender Vaginal: normal without tenderness, induration or masses Cervix: normal appearance Adnexa: normal bimanual exam Uterus: anteverted and non-tender, normal size    >50% of 15 min visit spent on counseling and coordination of care.   Data Reviewed Wet Prep and Cultures:  Positive for BV and negative for GC / CT Ultrasound:  US Pelvis Complete (Accession [id number]) (Order [id number])  Imaging  Date: 12/12/2017 [facility] Released By/Authorizing: Rolan BuccoBelfi, Melanie, MD (auto-released)  Exam Information   Status Exam Begun  Exam Ended   Final [99] 12/12/2017 2:20 PM 12/12/2017 3:22 PM  PACS Images   Show images for US Pelvis Complete  Study Result   CLINICAL DATA:  Right-sided pelvic pain for 1 week. Polycystic ovary syndrome. Clinical suspicion for ovarian torsion.  EXAM: TRANSABDOMINAL AND  TRANSVAGINAL ULTRASOUND OF PELVIS  DOPPLER ULTRASOUND OF OVARIES  TECHNIQUE: Both transabdominal and transvaginal ultrasound examinations of the pelvis were performed. Transabdominal technique was performed for global imaging of the pelvis including uterus, ovaries, adnexal regions, and pelvic cul-de-sac.  It was necessary to proceed with endovaginal exam following the transabdominal exam to visualize the endometrium and ovaries. Color and duplex Doppler ultrasound was utilized to evaluate blood flow to the ovaries.  COMPARISON:  02/05/2016  FINDINGS: Uterus  Measurements: 9.7 x 3.8 x 5.0 cm = volume: 97 mL. No fibroids or other mass visualized.  Endometrium  Thickness: 5 mm.  No focal abnormality visualized.  Right ovary  Measurements: 3.1 x 2.9 x 2.8 cm = volume: 13.0 mL. Normal appearance/no adnexal mass.m  Left ovary  Measurements: 3.5 x 1.9 x 2.7 cm = volume: 9.7 mL. Normal appearance/no adnexal mass.  Pulsed Doppler evaluation of both ovaries demonstrates normal low-resistance arterial and venous waveforms.  Other findings  No abnormal free fluid.  IMPRESSION: No pelvic mass or other significant abnormality identified.  No sonographic evidence for ovarian torsion.   Electronically Signed   By: Myles Rosenthal M.D.   On: 12/12/2017 15:30    Assessment     1. Pelvic pain - negative Gyn work up  2. Dysmenorrhea Rx: - ibuprofen (ADVIL,MOTRIN) 800 MG tablet; Take 1 tablet (800 mg total) by mouth every 8 (eight) hours as needed.  Dispense: 30 tablet; Refill: 5  3. Irritable bowel syndrome with constipation Rx: - dicyclomine (BENTYL) 20 MG tablet; Take 1 tablet (20 mg total) by mouth every 6 (six) hours.  Dispense: 90 tablet; Refill: 2 - Ambulatory referral to Gastroenterology  4. Class 3 severe obesity due to excess calories without serious comorbidity with body mass index (BMI) of 45.0 to 49.9 in adult [facility]) - program of exercise,  caloric restriction and behavioral modification recommended    Plan    Follow up in 3 months   Orders Placed This Encounter  Procedures  . Ambulatory referral to Gastroenterology    Referral Priority:   Routine    Referral Type:   Consultation    Referral Reason:   Specialty Services Required    Number of Visits Requested:   1   Meds ordered this encounter  Medications  . dicyclomine (BENTYL) 20 MG tablet    Sig: Take 1 tablet (20 mg total) by mouth every 6 (six) hours.    Dispense:  90 tablet    Refill:  2  . ibuprofen (ADVIL,MOTRIN) 800 MG tablet    Sig: Take 1 tablet (800 mg total) by mouth every 8 (eight) hours as needed.    Dispense:  30 tablet    Refill:  5     Brock Bad MD 12-14-2017

## 2017-12-14 NOTE — Progress Notes (Signed)
Presents for FU after ED visit 12/12/17. Currently on Flagyl.   C/o of lower abdominal pain 6/10.  Tramadol makes her sick.

## 2018-08-03 ENCOUNTER — Ambulatory Visit (HOSPITAL_COMMUNITY)
Admission: EM | Admit: 2018-08-03 | Discharge: 2018-08-03 | Disposition: A | Discharge status: Home / Self Care | Payer: Medicaid Other

## 2018-08-03 ENCOUNTER — Encounter (HOSPITAL_COMMUNITY): Payer: Self-pay | Admitting: Emergency Medicine

## 2018-08-03 DIAGNOSIS — J3089 Other allergic rhinitis: Secondary | ICD-10-CM

## 2018-08-03 DIAGNOSIS — R0981 Nasal congestion: Secondary | ICD-10-CM

## 2018-08-03 DIAGNOSIS — R05 Cough: Secondary | ICD-10-CM

## 2018-08-03 DIAGNOSIS — O99519 Diseases of the respiratory system complicating pregnancy, unspecified trimester: Secondary | ICD-10-CM

## 2018-08-03 DIAGNOSIS — J4530 Mild persistent asthma, uncomplicated: Secondary | ICD-10-CM

## 2018-08-03 DIAGNOSIS — J45909 Unspecified asthma, uncomplicated: Secondary | ICD-10-CM

## 2018-08-03 DIAGNOSIS — R059 Cough, unspecified: Secondary | ICD-10-CM

## 2018-08-03 MED ORDER — CETIRIZINE HCL 10 MG PO TABS: 10.0000 mg | ORAL_TABLET | Freq: Every day | ORAL | 0 refills | Status: AC

## 2018-08-03 MED ORDER — ALBUTEROL SULFATE HFA 108 (90 BASE) MCG/ACT IN AERS: 2.0000 | INHALATION_SPRAY | Freq: Four times a day (QID) | RESPIRATORY_TRACT | 0 refills | Status: DC | PRN

## 2018-08-03 MED ORDER — PREDNISONE 20 MG PO TABS: ORAL_TABLET | ORAL | 0 refills | Status: DC

## 2018-08-03 NOTE — ED Triage Notes (Signed)
Pt states for the last week shes had really bad allergies, post nasal drip, congestion, states she has a cough at night.

## 2018-08-03 NOTE — Discharge Instructions (Addendum)
For sore throat or cough try using a honey-based tea. Use 3 teaspoons of honey with juice squeezed from half lemon. Place shaved pieces of ginger into 1/2-1 cup of water and warm over stove top. Then mix the ingredients and repeat every 4 hours as needed. Please take Tylenol 500mg  every 6 hours. Hydrate very well with at least 2 liters of water. Eat light meals such as soups to replenish electrolytes and soft fruits, veggies. Start an antihistamine like Zyrtec, Allegra or Claritin for post-nasal drainage, sinus congestion. Hold off on starting your Flonase until 2 weeks since you will be using prednisone.

## 2018-08-03 NOTE — ED Provider Notes (Signed)
MRN: [id number] DOB: 01/20/1986  Subjective:   Kaitlyn Walton is a 33 y.o. female presenting for 1 week history of persistent constant moderate to severe sinus congestion, dry cough, chest congestion, wheezing and shortness of breath. Patient reports that she cleaned out her shed last week end and has had her sx since then. Has a hx of difficult to control allergies. Does not have her refills for this. Usually uses Zyrtec, Flonase. Has a Pro-Air inhaler, uses her nebulizer as well. Patient refuses COVID testing. Has taken significant precautions. Denies smoking cigarettes.    No current facility-administered medications for this encounter.   Current Outpatient Medications:  .  albuterol (ACCUNEB) 1.25 MG/3ML nebulizer solution, Take 1 ampule by nebulization every 6 (six) hours as needed for wheezing., Disp: , Rfl:  .  albuterol (PROVENTIL HFA;VENTOLIN HFA) 108 (90 Base) MCG/ACT inhaler, Inhale 2 puffs into the lungs every 6 (six) hours as needed for wheezing. For wheezing, Disp: 18 g, Rfl: 2 .  Multiple Vitamin (MULTIVITAMIN WITH MINERALS) TABS tablet, Take 1 tablet by mouth daily., Disp: , Rfl:     Allergies  Allergen Reactions  . Cefaclor Other (See Comments)    "ceclor"= childhood allergy  . Sulfonamide Derivatives Other (See Comments)    Sulfa= childhood allergy    Past Medical History:  Diagnosis Date  . Asthma   . Obesity   . PCOS (polycystic ovarian syndrome)      Past Surgical History:  Procedure Laterality Date  . LAPAROTOMY  06/18/2014   Procedure: LAPAROTOMY with removal of Left ectopic pregnancy;  Surgeon: Brock Badharles A Harper, MD;  Location: [facility];  Service: Gynecology;;  . LUNG SURGERY    . MOUTH SURGERY    . TUBAL LIGATION  10/13/2016    Review of Systems  Constitutional: Positive for fever and malaise/fatigue.  HENT: Positive for congestion. Negative for ear pain, sinus pain and sore throat.   Eyes: Negative for blurred vision, double vision, discharge and  redness.  Respiratory: Positive for cough. Negative for hemoptysis, shortness of breath and wheezing.   Cardiovascular: Negative for chest pain.  Gastrointestinal: Negative for abdominal pain, diarrhea, nausea and vomiting.  Genitourinary: Negative for dysuria, flank pain and hematuria.  Musculoskeletal: Negative for myalgias.  Skin: Negative for rash.  Neurological: Negative for weakness and headaches.  Psychiatric/Behavioral: Negative for depression and substance abuse.    Objective:   Vitals: BP 132/84   Pulse 94   Temp 99.4 F (37.4 C)   Resp 16   SpO2 96%   Physical Exam Constitutional:      General: She is not in acute distress.    Appearance: Normal appearance. She is well-developed. She is not ill-appearing, toxic-appearing or diaphoretic.  HENT:     Head: Normocephalic and atraumatic.     Right Ear: Tympanic membrane and ear canal normal. No drainage or tenderness. No middle ear effusion. Tympanic membrane is not erythematous.     Left Ear: Tympanic membrane and ear canal normal. No drainage or tenderness.  No middle ear effusion. Tympanic membrane is not erythematous.     Nose: Congestion (Boggy and edematous) present. No rhinorrhea.     Mouth/Throat:     Mouth: Mucous membranes are moist. No oral lesions.     Pharynx: No pharyngeal swelling, oropharyngeal exudate, posterior oropharyngeal erythema or uvula swelling.     Tonsils: No tonsillar exudate or tonsillar abscesses.     Comments: Has significant postnasal drainage. Eyes:     Extraocular Movements: Extraocular movements  intact.     Right eye: Normal extraocular motion.     Left eye: Normal extraocular motion.     Conjunctiva/sclera: Conjunctivae normal.     Pupils: Pupils are equal, round, and reactive to light.  Neck:     Musculoskeletal: Normal range of motion and neck supple.  Cardiovascular:     Rate and Rhythm: Normal rate and regular rhythm.     Pulses: Normal pulses.     Heart sounds: Normal heart  sounds. No murmur. No friction rub. No gallop.   Pulmonary:     Effort: Pulmonary effort is normal. No respiratory distress.     Breath sounds: No stridor. Wheezing (Mild over upper to mid lung fields bilaterally) present. No rhonchi or rales.  Lymphadenopathy:     Cervical: No cervical adenopathy.  Skin:    General: Skin is warm and dry.     Findings: No rash.  Neurological:     General: No focal deficit present.     Mental Status: She is alert and oriented to person, place, and time.  Psychiatric:        Mood and Affect: Mood normal.        Behavior: Behavior normal.        Thought Content: Thought content normal.     Assessment and Plan :   1. Allergic rhinitis due to other allergic trigger, unspecified seasonality   2. Sinus congestion   3. Cough   4. Asthma complicating pregnancy, antepartum   5. Mild persistent asthma without complication    Counseled patient on nature of COVID-19 including modes of transmission, diagnostic testing, management and supportive care. We discussed differential which includes COVID-19 but patient is adamant that she does not have this and refuses testing.  I counseled that using prednisone as she is requesting when she has coronavirus can complicate her symptoms.  Patient verbalizes understanding and would like to proceed with a steroid course to address her asthma and allergic rhinitis.  Use supportive care otherwise.  Refilled her albuterol inhaler. Counseled patient on potential for adverse effects with medications prescribed/recommended today, ER and return-to-clinic precautions discussed, patient verbalized understanding.    Jaynee Eagles, PA-C 08/03/18 1352

## 2018-10-02 ENCOUNTER — Ambulatory Visit (INDEPENDENT_AMBULATORY_CARE_PROVIDER_SITE_OTHER): Payer: Medicaid Other

## 2018-10-02 ENCOUNTER — Other Ambulatory Visit: Payer: Self-pay

## 2018-10-02 ENCOUNTER — Other Ambulatory Visit: Payer: Self-pay | Admitting: Obstetrics

## 2018-10-02 ENCOUNTER — Other Ambulatory Visit (HOSPITAL_COMMUNITY)
Admission: RE | Admit: 2018-10-02 | Discharge: 2018-10-02 | Disposition: A | Discharge status: Home / Self Care | Payer: Medicaid Other | Source: Ambulatory Visit | Attending: Obstetrics and Gynecology | Admitting: Obstetrics and Gynecology

## 2018-10-02 DIAGNOSIS — Z113 Encounter for screening for infections with a predominantly sexual mode of transmission: Secondary | ICD-10-CM

## 2018-10-02 DIAGNOSIS — N898 Other specified noninflammatory disorders of vagina: Secondary | ICD-10-CM

## 2018-10-02 DIAGNOSIS — B9689 Other specified bacterial agents as the cause of diseases classified elsewhere: Secondary | ICD-10-CM

## 2018-10-02 DIAGNOSIS — N760 Acute vaginitis: Secondary | ICD-10-CM

## 2018-10-02 MED ORDER — IBUPROFEN 800 MG PO TABS: 800.0000 mg | ORAL_TABLET | Freq: Three times a day (TID) | ORAL | 5 refills | Status: DC | PRN

## 2018-10-02 NOTE — Progress Notes (Signed)
SUBJECTIVE:  33 y.o. female complains of white vaginal discharge for few days.  Denies abnormal vaginal bleeding or significant pelvic pain or fever. No UTI symptoms. Pt request STD blood work.   No LMP recorded. (Menstrual status: Irregular Periods).  OBJECTIVE:  She appears well, afebrile. Urine dipstick: not done.  ASSESSMENT:  Vaginal Discharge  Vaginal Odor   PLAN:  STD blood work, GC, chlamydia, trichomonas, BVAG, CVAG probe sent to lab. Treatment: To be determined once lab results are received ROV prn if symptoms persist or worsen.

## 2018-10-02 NOTE — Progress Notes (Signed)
Ibuprofen 800 mg Rx

## 2018-10-03 LAB — HEPATITIS B SURFACE ANTIGEN: Hepatitis B Surface Ag: NEGATIVE

## 2018-10-03 LAB — SYPHILIS: RPR W/REFLEX TO RPR TITER AND TREPONEMAL ANTIBODIES, TRADITIONAL SCREENING AND DIAGNOSIS ALGORITHM: RPR Ser Ql: NONREACTIVE

## 2018-10-03 LAB — HIV ANTIBODY (ROUTINE TESTING W REFLEX): HIV Screen 4th Generation wRfx: NONREACTIVE

## 2018-10-03 LAB — HEPATITIS C ANTIBODY: Hep C Virus Ab: <0.1 {s_co_ratio} (ref 0.0–0.9)

## 2018-10-04 LAB — CERVICOVAGINAL ANCILLARY ONLY
Bacterial vaginitis: NEGATIVE
Candida vaginitis: NEGATIVE
Chlamydia: NEGATIVE
Neisseria Gonorrhea: NEGATIVE
Trichomonas: POSITIVE — AB

## 2018-10-09 ENCOUNTER — Other Ambulatory Visit: Payer: Self-pay

## 2018-10-09 DIAGNOSIS — A599 Trichomoniasis, unspecified: Secondary | ICD-10-CM

## 2018-10-09 MED ORDER — METRONIDAZOLE 500 MG PO TABS: ORAL_TABLET | ORAL | 1 refills | Status: DC

## 2018-10-09 NOTE — Progress Notes (Signed)
Flagyl sent for trichomonas, refill for partner treatment. Pt advised to refrain from intercourse until both have finished treatment.

## 2019-01-04 ENCOUNTER — Encounter: Payer: Self-pay | Admitting: Obstetrics

## 2019-01-04 ENCOUNTER — Other Ambulatory Visit (HOSPITAL_COMMUNITY)
Admission: RE | Admit: 2019-01-04 | Discharge: 2019-01-04 | Disposition: A | Discharge status: Home / Self Care | Payer: Medicaid Other | Source: Ambulatory Visit | Attending: Obstetrics | Admitting: Obstetrics

## 2019-01-04 ENCOUNTER — Ambulatory Visit: Payer: Medicaid Other | Admitting: Obstetrics

## 2019-01-04 ENCOUNTER — Other Ambulatory Visit: Payer: Self-pay

## 2019-01-04 VITALS — BP 149/86 | HR 88 | Wt 299.0 lb

## 2019-01-04 DIAGNOSIS — N939 Abnormal uterine and vaginal bleeding, unspecified: Secondary | ICD-10-CM | POA: Diagnosis not present

## 2019-01-04 DIAGNOSIS — Z6841 Body Mass Index (BMI) 40.0 and over, adult: Secondary | ICD-10-CM

## 2019-01-04 DIAGNOSIS — N898 Other specified noninflammatory disorders of vagina: Secondary | ICD-10-CM

## 2019-01-04 DIAGNOSIS — N944 Primary dysmenorrhea: Secondary | ICD-10-CM | POA: Diagnosis not present

## 2019-01-04 DIAGNOSIS — R300 Dysuria: Secondary | ICD-10-CM

## 2019-01-04 MED ORDER — OXYCODONE-ACETAMINOPHEN 5-325 MG PO TABS: 1.0000 | ORAL_TABLET | ORAL | 0 refills | Status: DC | PRN

## 2019-01-04 MED ORDER — IBUPROFEN 800 MG PO TABS: 800.0000 mg | ORAL_TABLET | Freq: Three times a day (TID) | ORAL | 5 refills | Status: AC | PRN

## 2019-01-04 MED ORDER — METRONIDAZOLE 500 MG PO TABS: 500.0000 mg | ORAL_TABLET | Freq: Two times a day (BID) | ORAL | 2 refills | Status: DC

## 2019-01-04 MED ORDER — NORETHINDRONE ACETATE 5 MG PO TABS: 5.0000 mg | ORAL_TABLET | Freq: Every day | ORAL | 0 refills | Status: DC

## 2019-01-04 NOTE — Progress Notes (Signed)
RGYN patient presents for problem visit today.  Pt c/o irregular periods for years and recenty  vaginal pain./ different discharge. Back pain 8/10 internal vaginal pain per pt will be 10/10. Pt notes she has not been sexually active. In a while.   Last pap 12/02/2016  LMP: 12/28/18

## 2019-01-04 NOTE — Addendum Note (Signed)
Addended by: Baltazar Najjar A on: 01/04/2019 12:10 PM   Modules accepted: Orders

## 2019-01-04 NOTE — Progress Notes (Signed)
Patient ID: Kaitlyn Walton Smoker, female   DOB: 08/20/1985, 33 y.o.   MRN: [id number]  Chief Complaint  Patient presents with  . Menstrual Problem    Irregular periods  . Vaginal Discharge    HPI Kaitlyn Walton Meda is a 33 y.o. female.  Irregular periods, and spotting in between periods with brownish discharge. HPI  Past Medical History:  Diagnosis Date  . Asthma   . Obesity   . PCOS (polycystic ovarian syndrome)     Past Surgical History:  Procedure Laterality Date  . LAPAROTOMY  06/18/2014   Procedure: LAPAROTOMY with removal of Left ectopic pregnancy;  Surgeon: Brock Badharles A Oden Lindaman, MD;  Location: [facility];  Service: Gynecology;;  . LUNG SURGERY    . MOUTH SURGERY    . TUBAL LIGATION  10/13/2016    Family History  Problem Relation Age of Onset  . Diabetes Mother   . Cancer Maternal Aunt   . Breast cancer Maternal Aunt   . Hypertension Maternal Grandmother   . Hypertension Maternal Grandfather   . Cancer Maternal Grandfather   . Hypertension Paternal Grandmother   . Hypertension Paternal Grandfather   . Asthma Other   . GI problems Other   . Hyperlipidemia Other   . Obesity Other   . Stroke Other   . Sleep apnea Other   . Bipolar disorder Father   . Bipolar disorder Sister     Social History Social History   Tobacco Use  . Smoking status: Never Smoker  . Smokeless tobacco: Never Used  Substance Use Topics  . Alcohol use: No    Alcohol/week: 0.0 standard drinks  . Drug use: No    Allergies  Allergen Reactions  . Cefaclor Other (See Comments)    "ceclor"= childhood allergy  . Sulfonamide Derivatives Other (See Comments)    Sulfa= childhood allergy    Current Outpatient Medications  Medication Sig Dispense Refill  . albuterol (ACCUNEB) 1.25 MG/3ML nebulizer solution Take 1 ampule by nebulization every 6 (six) hours as needed for wheezing.    Marland Kitchen. albuterol (VENTOLIN HFA) 108 (90 Base) MCG/ACT inhaler Inhale 2 puffs into the lungs every 6 (six) hours as  needed for wheezing. For wheezing 18 g 0  . cetirizine (ZYRTEC ALLERGY) 10 MG tablet Take 1 tablet (10 mg total) by mouth daily. 90 tablet 0  . ibuprofen (ADVIL) 800 MG tablet Take 1 tablet (800 mg total) by mouth every 8 (eight) hours as needed. 60 tablet 5  . metroNIDAZOLE (FLAGYL) 500 MG tablet Take two tablets by mouth twice a day, for one day.  Or you can take all four tablets at once if you can tolerate it. (Patient not taking: Reported on 01/04/2019) 4 tablet 1  . metroNIDAZOLE (FLAGYL) 500 MG tablet Take 1 tablet (500 mg total) by mouth 2 (two) times daily. 14 tablet 2  . Multiple Vitamin (MULTIVITAMIN WITH MINERALS) TABS tablet Take 1 tablet by mouth daily.    . norethindrone (AYGESTIN) 5 MG tablet Take 1 tablet (5 mg total) by mouth daily. 30 tablet 0  . oxyCODONE-acetaminophen (PERCOCET/ROXICET) 5-325 MG tablet Take 1-2 tablets by mouth every 4 (four) hours as needed for severe pain. 30 tablet 0  . predniSONE (DELTASONE) 20 MG tablet Take 2 tablets daily with breakfast. (Patient not taking: Reported on 01/04/2019) 10 tablet 0   No current facility-administered medications for this visit.     Review of Systems Review of Systems Constitutional: negative for fatigue and weight loss Respiratory: negative for  cough and wheezing Cardiovascular: negative for chest pain, fatigue and palpitations Gastrointestinal: negative for abdominal pain and change in bowel habits Genitourinary:positive for irregular periods Integument/breast: negative for nipple discharge Musculoskeletal:negative for myalgias Neurological: negative for gait problems and tremors Behavioral/Psych: negative for abusive relationship, depression Endocrine: negative for temperature intolerance      Blood pressure (!) 149/86, pulse 88, weight 299 lb (135.6 kg), last menstrual period 12/28/2018.  Physical Exam Physical Exam General:   alert  Skin:   no rash or abnormalities  Lungs:   clear to auscultation bilaterally   Heart:   regular rate and rhythm, S1, S2 normal, no murmur, click, rub or gallop  Breasts:   normal without suspicious masses, skin or nipple changes or axillary nodes  Abdomen:  normal findings: no organomegaly, soft, non-tender and no hernia  Pelvis:  External genitalia: normal general appearance Urinary system: urethral meatus normal and bladder without fullness, nontender Vaginal: normal without tenderness, induration or masses Cervix: normal appearance Adnexa: normal bimanual exam Uterus: anteverted and non-tender, normal size    50% of 15 min visit spent on counseling and coordination of care.   Data Reviewed Labs  Assessment     1. Abnormal uterine bleeding (AUB) Rx: - US PELVIC COMPLETE WITH TRANSVAGINAL; Future - norethindrone (AYGESTIN) 5 MG tablet; Take 1 tablet (5 mg total) by mouth daily.  Dispense: 30 tablet; Refill: 0  2. Vaginal discharge Rx: - Cervicovaginal ancillary only( [place]) - metroNIDAZOLE (FLAGYL) 500 MG tablet; Take 1 tablet (500 mg total) by mouth 2 (two) times daily.  Dispense: 14 tablet; Refill: 2  3. Primary dysmenorrhea Rx: - ibuprofen (ADVIL) 800 MG tablet; Take 1 tablet (800 mg total) by mouth every 8 (eight) hours as needed.  Dispense: 60 tablet; Refill: 5 - oxyCODONE-acetaminophen (PERCOCET/ROXICET) 5-325 MG tablet; Take 1-2 tablets by mouth every 4 (four) hours as needed for severe pain.  Dispense: 30 tablet; Refill: 0  4. Class 3 severe obesity due to excess calories without serious comorbidity with body mass index (BMI) of 45.0 to 49.9 in adult [facility]) - program of dietary restriction, exercise and behavioral modification recommended for weight reduction and maintenance     Plan    Follow up in 2 weeks  Orders Placed This Encounter  Procedures  . US PELVIC COMPLETE WITH TRANSVAGINAL    Standing Status:   Future    Standing Expiration Date:   03/06/2020    Order Specific Question:   Reason for Exam (SYMPTOM  OR DIAGNOSIS REQUIRED)     Answer:   AUB    Order Specific Question:   Preferred imaging location?    Answer:   [facility] Outpatient Ultrasound   Meds ordered this encounter  Medications  . ibuprofen (ADVIL) 800 MG tablet    Sig: Take 1 tablet (800 mg total) by mouth every 8 (eight) hours as needed.    Dispense:  60 tablet    Refill:  5  . oxyCODONE-acetaminophen (PERCOCET/ROXICET) 5-325 MG tablet    Sig: Take 1-2 tablets by mouth every 4 (four) hours as needed for severe pain.    Dispense:  30 tablet    Refill:  0  . norethindrone (AYGESTIN) 5 MG tablet    Sig: Take 1 tablet (5 mg total) by mouth daily.    Dispense:  30 tablet    Refill:  0  . metroNIDAZOLE (FLAGYL) 500 MG tablet    Sig: Take 1 tablet (500 mg total) by mouth 2 (two) times daily.  Dispense:  14 tablet    Refill:  2    Brock Bad, MD 01/04/2019 11:44 AM

## 2019-01-07 LAB — CERVICOVAGINAL ANCILLARY ONLY
Bacterial Vaginitis (gardnerella): NEGATIVE
Candida Glabrata: NEGATIVE
Candida Vaginitis: POSITIVE — AB
Chlamydia: NEGATIVE
Comment: NEGATIVE
Comment: NEGATIVE
Comment: NEGATIVE
Comment: NEGATIVE
Comment: NEGATIVE
Comment: NORMAL
Neisseria Gonorrhea: NEGATIVE
Trichomonas: NEGATIVE

## 2019-01-09 LAB — URINE CULTURE

## 2019-01-10 ENCOUNTER — Other Ambulatory Visit: Payer: Self-pay

## 2019-01-10 ENCOUNTER — Other Ambulatory Visit: Payer: Self-pay | Admitting: Obstetrics

## 2019-01-10 DIAGNOSIS — N760 Acute vaginitis: Secondary | ICD-10-CM

## 2019-01-10 MED ORDER — FLUCONAZOLE 150 MG PO TABS: 150.0000 mg | ORAL_TABLET | Freq: Once | ORAL | 1 refills | Status: AC

## 2019-01-10 NOTE — Progress Notes (Signed)
Pt called and wanted to know her test result for vaginal swab. I advised pt that she did have some yeast on her result. Pt verbalized itching and discomfort. I advised pt I will send diflucan for her to the pharmacy with one refill to take in 3 days if needed. Pt verbalizes understanding.

## 2019-01-11 ENCOUNTER — Other Ambulatory Visit: Payer: Self-pay

## 2019-01-11 ENCOUNTER — Ambulatory Visit (HOSPITAL_COMMUNITY)
Admission: RE | Admit: 2019-01-11 | Discharge: 2019-01-11 | Disposition: A | Discharge status: Home / Self Care | Payer: Medicaid Other | Source: Ambulatory Visit | Attending: Obstetrics | Admitting: Obstetrics

## 2019-01-11 DIAGNOSIS — N939 Abnormal uterine and vaginal bleeding, unspecified: Secondary | ICD-10-CM | POA: Insufficient documentation

## 2019-01-18 ENCOUNTER — Encounter: Payer: Self-pay | Admitting: Obstetrics

## 2019-01-18 ENCOUNTER — Telehealth (INDEPENDENT_AMBULATORY_CARE_PROVIDER_SITE_OTHER): Payer: Medicaid Other | Admitting: Obstetrics

## 2019-01-18 DIAGNOSIS — N939 Abnormal uterine and vaginal bleeding, unspecified: Secondary | ICD-10-CM

## 2019-01-18 DIAGNOSIS — N944 Primary dysmenorrhea: Secondary | ICD-10-CM | POA: Diagnosis not present

## 2019-01-18 DIAGNOSIS — Z6841 Body Mass Index (BMI) 40.0 and over, adult: Secondary | ICD-10-CM | POA: Diagnosis not present

## 2019-01-18 NOTE — Progress Notes (Signed)
TELEHEALTH VIRTUAL GYNECOLOGY VISIT ENCOUNTER NOTE  I connected with Kaitlyn Walton on 01/18/19 at 10:30 AM EST by telephone at home and verified that I am speaking with the correct person using two identifiers.   I discussed the limitations, risks, security and privacy concerns of performing an evaluation and management service by telephone and the availability of in person appointments. I also discussed with the patient that there may be a patient responsible charge related to this service. The patient expressed understanding and agreed to proceed.   History:  Kaitlyn Walton is a 33 y.o. [phone] female being evaluated today for AUB - spotting in between periods. She denies any abnormal vaginal discharge, bleeding, pelvic pain or other concerns.       Past Medical History:  Diagnosis Date  . Asthma   . Obesity   . PCOS (polycystic ovarian syndrome)    Past Surgical History:  Procedure Laterality Date  . LAPAROTOMY  06/18/2014   Procedure: LAPAROTOMY with removal of Left ectopic pregnancy;  Surgeon: Shelly Bombard, MD;  Location: [facility];  Service: Gynecology;;  . LUNG SURGERY    . MOUTH SURGERY    . TUBAL LIGATION  10/13/2016   The following portions of the patient's history were reviewed and updated as appropriate: allergies, current medications, past family history, past medical history, past social history, past surgical history and problem list.   Health Maintenance:  Normal pap and negative HRHPV on 12-02-2016.  Review of Systems:  Pertinent items noted in HPI and remainder of comprehensive ROS otherwise negative.  Physical Exam:   General:  Alert, oriented and cooperative.   Mental Status: Normal mood and affect perceived. Normal judgment and thought content.  Physical exam deferred due to nature of the encounter  Labs and Imaging Results for orders placed or performed in visit on 01/04/19 (from the past 336 hour(s))  Cervicovaginal ancillary only(  [place])   Collection Time: 01/04/19 11:24 AM  Result Value Ref Range   Neisseria Gonorrhea Negative    Chlamydia Negative    Trichomonas Negative    Bacterial Vaginitis (gardnerella) Negative    Candida Vaginitis Positive (A)    Candida Glabrata Negative    Comment      Normal Reference Range Bacterial Vaginosis - Negative   Comment Normal Reference Range Candida Species - Negative    Comment Normal Reference Range Candida Galbrata - Negative    Comment Normal Reference Range Trichomonas - Negative    Comment Normal Reference [place] Chlamydia - Negative    Comment      Normal Reference Range Neisseria Gonorrhea - Negative  Urine culture   Collection Time: 01/07/19  8:15 AM   Specimen: Urine   UC  Result Value Ref Range   Urine Culture, Routine Final report    Organism ID, Bacteria Comment    US PELVIC COMPLETE WITH TRANSVAGINAL  Result Date: 01/11/2019 CLINICAL DATA:  Abnormal uterine bleeding EXAM: TRANSABDOMINAL AND TRANSVAGINAL ULTRASOUND OF PELVIS TECHNIQUE: Both transabdominal and transvaginal ultrasound examinations of the pelvis were performed. Transabdominal technique was performed for global imaging of the pelvis including uterus, ovaries, adnexal regions, and pelvic cul-de-sac. It was necessary to proceed with endovaginal exam following the transabdominal exam to visualize the endometrium and LEFT ovary. COMPARISON:  12/12/2017 FINDINGS: Uterus Measurements: 8.7 x 4.2 x 5.6 = volume: 108 mL. Anteverted. Normal morphology without mass. Endometrium Thickness: 3 mm.  No endometrial fluid or focal abnormality Right ovary Measurements: 2.5 x 2.3 x 1.9  cm = volume: 5.7 mL. Normal morphology without mass Left ovary Measurements: 3.1 x 2.3 x 2.6 cm = volume: 9.5 mL. Small hyperechoic focus which likely represents a corpus albicans in each ovary. Probable corpus luteum within LEFT ovary 1.8 cm greatest size. No other masses. Other findings No free pelvic fluid or adnexal masses.  IMPRESSION: No pelvic sonographic abnormalities. Electronically Signed   By: Ulyses Southward M.D.   On: 01/11/2019 12:37      Assessment and Plan:     1. Abnormal uterine bleeding (AUB) - taking Aygestin  2. Primary dysmenorrhea - stable.  Ibuprofen prn  3. Class 3 severe obesity due to excess calories without serious comorbidity with body mass index (BMI) of 45.0 to 49.9 in adult [facility]) - program of caloric restriction, exercise and behavioral modification recommended       I discussed the assessment and treatment plan with the patient. The patient was provided an opportunity to ask questions and all were answered. The patient agreed with the plan and demonstrated an understanding of the instructions.   The patient was advised to call back or seek an in-person evaluation/go to the ED if the symptoms worsen or if the condition fails to improve as anticipated.  I provided 15 minutes of non-face-to-face time during this encounter.   Coral Ceo, MD [facility], [facility] 01/18/2019

## 2019-06-06 ENCOUNTER — Emergency Department (HOSPITAL_COMMUNITY): Payer: Medicaid Other

## 2019-06-06 ENCOUNTER — Other Ambulatory Visit: Payer: Self-pay

## 2019-06-06 ENCOUNTER — Encounter (HOSPITAL_COMMUNITY): Payer: Self-pay | Admitting: Emergency Medicine

## 2019-06-06 ENCOUNTER — Emergency Department (HOSPITAL_COMMUNITY)
Admission: EM | Admit: 2019-06-06 | Discharge: 2019-06-06 | Disposition: A | Discharge status: Home / Self Care | Payer: Medicaid Other | Attending: Emergency Medicine | Admitting: Emergency Medicine

## 2019-06-06 DIAGNOSIS — R1031 Right lower quadrant pain: Secondary | ICD-10-CM

## 2019-06-06 DIAGNOSIS — R110 Nausea: Secondary | ICD-10-CM | POA: Diagnosis not present

## 2019-06-06 LAB — COMPREHENSIVE METABOLIC PANEL
ALT: 76 U/L — ABNORMAL HIGH (ref 0–44)
AST: 42 U/L — ABNORMAL HIGH (ref 15–41)
Albumin: 4 g/dL (ref 3.5–5.0)
Alkaline Phosphatase: 62 U/L (ref 38–126)
Anion gap: 8 (ref 5–15)
BUN: 12 mg/dL (ref 6–20)
CO2: 26 mmol/L (ref 22–32)
Calcium: 8.8 mg/dL — ABNORMAL LOW (ref 8.9–10.3)
Chloride: 105 mmol/L (ref 98–111)
Creatinine, Ser: 0.7 mg/dL (ref 0.44–1.00)
GFR calc Af Amer: >60 mL/min (ref >60)
GFR calc non Af Amer: >60 mL/min (ref >60)
Glucose, Bld: 121 mg/dL — ABNORMAL HIGH (ref 70–99)
Potassium: 3.8 mmol/L (ref 3.5–5.1)
Sodium: 139 mmol/L (ref 135–145)
Total Bilirubin: 0.6 mg/dL (ref 0.3–1.2)
Total Protein: 6.7 g/dL (ref 6.5–8.1)

## 2019-06-06 LAB — LIPASE, BLOOD: Lipase: 21 U/L (ref 11–51)

## 2019-06-06 LAB — URINALYSIS, ROUTINE W REFLEX MICROSCOPIC
Bilirubin Urine: NEGATIVE
Glucose, UA: NEGATIVE mg/dL
Hgb urine dipstick: NEGATIVE
Ketones, ur: NEGATIVE mg/dL
Nitrite: NEGATIVE
Protein, ur: NEGATIVE mg/dL
Specific Gravity, Urine: 1.011 (ref 1.005–1.030)
pH: 6 (ref 5.0–8.0)

## 2019-06-06 LAB — CBC
HCT: 42 % (ref 36.0–46.0)
Hemoglobin: 13.6 g/dL (ref 12.0–15.0)
MCH: 29.1 pg (ref 26.0–34.0)
MCHC: 32.4 g/dL (ref 30.0–36.0)
MCV: 89.9 fL (ref 80.0–100.0)
Platelets: 351 10*3/uL (ref 150–400)
RBC: 4.67 MIL/uL (ref 3.87–5.11)
RDW: 12.8 % (ref 11.5–15.5)
WBC: 13.9 10*3/uL — ABNORMAL HIGH (ref 4.0–10.5)
nRBC: 0 % (ref 0.0–0.2)

## 2019-06-06 LAB — PREGNANCY, URINE: Preg Test, Ur: NEGATIVE

## 2019-06-06 MED ORDER — MORPHINE SULFATE (PF) 4 MG/ML IV SOLN
4.0000 mg | Freq: Once | INTRAVENOUS | Status: AC
  Administered 2019-06-06: 20:00:00 4 mg via INTRAVENOUS
  Filled 2019-06-06: qty 1

## 2019-06-06 MED ORDER — ONDANSETRON HCL 4 MG/2ML IJ SOLN
4.0000 mg | Freq: Once | INTRAMUSCULAR | Status: AC
  Administered 2019-06-06: 4 mg via INTRAVENOUS
  Filled 2019-06-06: qty 2

## 2019-06-06 MED ORDER — IOHEXOL 300 MG/ML  SOLN
100.0000 mL | Freq: Once | INTRAMUSCULAR | Status: AC | PRN
  Administered 2019-06-06: 100 mL via INTRAVENOUS

## 2019-06-06 NOTE — ED Notes (Signed)
Pt sister present to take pt home. Pt a/o and ambulatory at discharge. Pt verbalizes understanding to discharge and denies questions.

## 2019-06-06 NOTE — ED Provider Notes (Signed)
[facility]-EMERGENCY DEPT Provider Note   CSN: [id number] Arrival date & time: 06/06/19  1452     History Chief Complaint  Patient presents with  . Abdominal Pain    Kaitlyn Walton is a 34 y.o. female.  The history is provided by the patient.  Abdominal Pain Pain location:  RLQ Pain quality: cramping, gnawing and shooting   Pain radiates to:  Back Pain severity:  Moderate Onset quality:  Gradual Duration:  2 days Timing:  Constant Progression:  Worsening Chronicity:  New Context comment:  Patient denies any injury, recent changes Relieved by: Better when being still. Worsened by:  Movement (Is not seem to be affected by eating) Associated symptoms: anorexia and nausea   Associated symptoms: no constipation, no diarrhea, no dysuria, no fever, no shortness of breath, no vaginal bleeding, no vaginal discharge and no vomiting   Risk factors comment:  History of ectopic pregnancy status post surgery, PCOS      Past Medical History:  Diagnosis Date  . Asthma   . Obesity   . PCOS (polycystic ovarian syndrome)     Patient Active Problem List   Diagnosis Date Noted  . Ectopic pregnancy, tubal 06/18/2014  . Obesity 07/27/2013  . PCOS (polycystic ovarian syndrome) 07/27/2013  . Abnormal uterine bleeding (AUB) 06/11/2013  . Asthma 06/07/2012  . Monilial rash 05/23/2012  . Asthma complicating pregnancy, antepartum 05/10/2012  . History of PCOS 02/20/2012    Past Surgical History:  Procedure Laterality Date  . LAPAROTOMY  06/18/2014   Procedure: LAPAROTOMY with removal of Left ectopic pregnancy;  Surgeon: Brock Bad, MD;  Location: [facility];  Service: Gynecology;;  . LUNG SURGERY    . MOUTH SURGERY    . TUBAL LIGATION  10/13/2016     OB History    Gravida  5   Para  3   Term  3   Preterm  0   AB  1   Living  3     SAB  1   TAB  0   Ectopic  0   Multiple  0   Live Births  3           Family History  Problem  Relation Age of Onset  . Diabetes Mother   . Cancer Maternal Aunt   . Breast cancer Maternal Aunt   . Hypertension Maternal Grandmother   . Hypertension Maternal Grandfather   . Cancer Maternal Grandfather   . Hypertension Paternal Grandmother   . Hypertension Paternal Grandfather   . Asthma Other   . GI problems Other   . Hyperlipidemia Other   . Obesity Other   . Stroke Other   . Sleep apnea Other   . Bipolar disorder Father   . Bipolar disorder Sister     Social History   Tobacco Use  . Smoking status: Never Smoker  . Smokeless tobacco: Never Used  Substance Use Topics  . Alcohol use: No    Alcohol/week: 0.0 standard drinks  . Drug use: No    Home Medications Prior to Admission medications   Medication Sig Start Date End Date Taking? Authorizing Provider  albuterol (ACCUNEB) 1.25 MG/3ML nebulizer solution Take 1 ampule by nebulization every 6 (six) hours as needed for wheezing.   Yes [provider]  albuterol (VENTOLIN HFA) 108 (90 Base) MCG/ACT inhaler Inhale 2 puffs into the lungs every 6 (six) hours as needed for wheezing. For wheezing 08/03/18  Yes Wallis Bamberg, PA-C  cetirizine (ZYRTEC ALLERGY) 10 MG tablet Take 1 tablet (10 mg total) by mouth daily. 08/03/18  Yes Wallis Bamberg, PA-C  Multiple Vitamin (MULTIVITAMIN WITH MINERALS) TABS tablet Take 1 tablet by mouth daily.   Yes [provider]  Vitamin D, Ergocalciferol, (DRISDOL) 1.25 MG (50000 UNIT) CAPS capsule Take 50,000 Units by mouth once a week. Mondays 04/09/19  Yes [provider]  ibuprofen (ADVIL) 800 MG tablet Take 1 tablet (800 mg total) by mouth every 8 (eight) hours as needed. Patient not taking: Reported on 06/06/2019 01/04/19   Brock Bad, MD  norethindrone (AYGESTIN) 5 MG tablet Take 1 tablet (5 mg total) by mouth daily. Patient not taking: Reported on 06/06/2019 01/04/19   Brock Bad, MD  oxyCODONE-acetaminophen (PERCOCET/ROXICET) 5-325 MG tablet Take 1-2 tablets by mouth  every 4 (four) hours as needed for severe pain. Patient not taking: Reported on 01/18/2019 01/04/19   Brock Bad, MD  phentermine (ADIPEX-P) 37.5 MG tablet Take 37.5 mg by mouth daily. 05/08/19   [provider]  dicyclomine (BENTYL) 20 MG tablet Take 1 tablet (20 mg total) by mouth every 6 (six) hours. Patient not taking: Reported on 08/03/2018 12/14/17 08/03/18  Brock Bad, MD  medroxyPROGESTERone (PROVERA) 10 MG tablet Take 1 tablet (10 mg total) by mouth daily. Patient not taking: Reported on 04/19/2017 04/04/17 08/03/18  Brock Bad, MD  MedroxyPROGESTERone Acetate 150 MG/ML SUSY INJECT INTO THE MUSCLE EVERY 3 MONTHS Patient not taking: Reported on 12/02/2016 09/06/16 08/03/18  Brock Bad, MD    Allergies    Cefaclor and Sulfonamide derivatives  Review of Systems   Review of Systems  Constitutional: Negative for fever.  Respiratory: Negative for shortness of breath.   Gastrointestinal: Positive for abdominal pain, anorexia and nausea. Negative for constipation, diarrhea and vomiting.  Genitourinary: Negative for dysuria, vaginal bleeding and vaginal discharge.  All other systems reviewed and are negative.   Physical Exam Updated Vital Signs BP (!) 142/88   Pulse 75   Temp 99.3 F (37.4 C) (Oral)   Resp 16   SpO2 95%   Physical Exam Vitals and nursing note reviewed.  Constitutional:      General: She is not in acute distress.    Appearance: She is well-developed. She is obese.  HENT:     Head: Normocephalic and atraumatic.  Eyes:     Pupils: Pupils are equal, round, and reactive to light.  Cardiovascular:     Rate and Rhythm: Normal rate and regular rhythm.     Heart sounds: Normal heart sounds. No murmur. No friction rub.  Pulmonary:     Effort: Pulmonary effort is normal.     Breath sounds: Normal breath sounds. No wheezing or rales.  Abdominal:     General: Bowel sounds are normal. There is no distension.     Palpations: Abdomen is soft.       Tenderness: There is abdominal tenderness in the right lower quadrant. There is guarding. There is no rebound.  Musculoskeletal:        General: No tenderness. Normal range of motion.     Comments: No edema  Skin:    General: Skin is warm and dry.     Findings: No rash.  Neurological:     Mental Status: She is alert and oriented to person, place, and time.     Cranial Nerves: No cranial nerve deficit.  Psychiatric:        Behavior: Behavior normal.  ED Results / Procedures / Treatments   Labs (all labs ordered are listed, but only abnormal results are displayed) Labs Reviewed  COMPREHENSIVE METABOLIC PANEL - Abnormal; Notable for the following components:      Result Value   Glucose, Bld 121 (*)    Calcium 8.8 (*)    AST 42 (*)    ALT 76 (*)    All other components within normal limits  CBC - Abnormal; Notable for the following components:   WBC 13.9 (*)    All other components within normal limits  URINALYSIS, ROUTINE W REFLEX MICROSCOPIC - Abnormal; Notable for the following components:   Leukocytes,Ua TRACE (*)    Bacteria, UA RARE (*)    All other components within normal limits  LIPASE, BLOOD  PREGNANCY, URINE    EKG None  Radiology CT ABDOMEN PELVIS W CONTRAST  Result Date: 06/06/2019 CLINICAL DATA:  34 year old female with right lower quadrant abdominal pain. EXAM: CT ABDOMEN AND PELVIS WITH CONTRAST TECHNIQUE: Multidetector CT imaging of the abdomen and pelvis was performed using the standard protocol following bolus administration of intravenous contrast. CONTRAST:  112mL OMNIPAQUE IOHEXOL 300 MG/ML  SOLN COMPARISON:  CT abdomen pelvis dated 12/12/2017. FINDINGS: Lower chest: The visualized lung bases are clear. No intra-abdominal free air or free fluid. Hepatobiliary: Mild fatty infiltration of the liver. No intrahepatic biliary dilatation. The gallbladder is unremarkable. Pancreas: Unremarkable. No pancreatic ductal dilatation or surrounding inflammatory  changes. Spleen: Normal in size without focal abnormality. Adrenals/Urinary Tract: The adrenal glands are unremarkable. The kidneys, visualized ureters, and urinary bladder appear unremarkable. Stomach/Bowel: There is no bowel obstruction or active inflammation. The appendix is normal. Vascular/Lymphatic: The abdominal aorta and IVC unremarkable. No portal venous gas. There is no adenopathy. Reproductive: The uterus is anteverted and grossly unremarkable. Probable 2 cm right ovarian dominant follicle or corpus luteum. The left ovary is unremarkable. Other: None Musculoskeletal: No acute or significant osseous findings. IMPRESSION: 1. No acute intra-abdominal or pelvic pathology. Normal appendix. 2. Mild fatty liver. 3. Probable 2 cm right ovarian dominant follicle or corpus luteum. Electronically Signed   By: Anner Crete M.D.   On: 06/06/2019 22:11    Procedures Procedures (including critical care time)  Medications Ordered in ED Medications  morphine 4 MG/ML injection 4 mg (4 mg Intravenous Given 06/06/19 2020)  ondansetron (ZOFRAN) injection 4 mg (4 mg Intravenous Given 06/06/19 2018)    ED Course  I have reviewed the triage vital signs and the nursing notes.  Pertinent labs & imaging results that were available during my care of the patient were reviewed by me and considered in my medical decision making (see chart for details).    MDM Rules/Calculators/A&P                      34 year old female presenting today with right lower quadrant pain.  Patient does have guarding and rebound.  Symptoms have been present for approximately 2 days.  Patient denies any urinary or vaginal complaints.  UPT is negative.  UA without significant findings.  CBC with mild leukocytosis of 13,000, CMP with mild elevated LFTs with normal total bili and normal lipase.  Patient's exam findings are concerning for appendicitis versus ovarian pathology versus kidney stone.  Lower suspicion for cholecystitis. CT  pending.  Patient given pain control.  10:34 PM Patient's CT shows no acute intra-abdominal or pelvic pathology.  Patient has a 2 cm right ovarian dominant follicle but no other acute findings.  Speaking with the patient she is currently sexually active and she has been with this partner for a year.  Prior to that she has only had one other partner in her life.  She has not had any new vaginal discharge itching or burning and lower suspicion for STI.  Did give patient the option of pelvic exam and swabs but she prefers to wait at this time.  Low suspicion for ovarian torsion at this time.  Patient has follow-up with GI on Monday for colonoscopy.  Patient given strict return precautions.  However at this time feel that she can go home and she will continue to try Tylenol and ibuprofen for pain.  MDM Number of Diagnoses or Management Options   Amount and/or Complexity of Data Reviewed Clinical lab tests: ordered and reviewed Tests in the radiology section of CPT: ordered and reviewed Obtain history from someone other than the patient: no Review and summarize past medical records: yes Discuss the patient with other providers: no Independent visualization of images, tracings, or specimens: yes  Risk of Complications, Morbidity, and/or Mortality Presenting problems: moderate Diagnostic procedures: low Management options: low  Patient Progress Patient progress: stable    Final Clinical Impression(s) / ED Diagnoses Final diagnoses:  Right lower quadrant abdominal pain    Rx / DC Orders ED Discharge Orders    None       Gwyneth Sprout, MD 06/06/19 2242

## 2019-06-06 NOTE — Discharge Instructions (Signed)
The CAT scan today did not show any sign of kidney stone or appendicitis.  Urine did not show signs of infection.  This could be something related to your ovary however will be important to follow-up with your gastroenterologist on Monday as planned.  If there is tests come back normal you should follow-up with your OB/GYN.  If in the meantime you develop excruciating pain, uncontrolled vomiting, fever or other concerns please return to the emergency room.

## 2019-06-06 NOTE — ED Triage Notes (Signed)
Patient reports RLQ pain with nausea worsening for a few days. Denies diarrhea and urinary sx.

## 2019-06-14 ENCOUNTER — Other Ambulatory Visit: Payer: Self-pay

## 2019-06-14 ENCOUNTER — Ambulatory Visit: Payer: Medicaid Other | Admitting: Obstetrics

## 2019-06-14 ENCOUNTER — Encounter: Payer: Self-pay | Admitting: Obstetrics

## 2019-06-14 VITALS — BP 151/91 | HR 85 | Wt 297.0 lb

## 2019-06-14 DIAGNOSIS — R102 Pelvic and perineal pain: Secondary | ICD-10-CM

## 2019-06-14 DIAGNOSIS — N83201 Unspecified ovarian cyst, right side: Secondary | ICD-10-CM | POA: Diagnosis not present

## 2019-06-14 DIAGNOSIS — Z6841 Body Mass Index (BMI) 40.0 and over, adult: Secondary | ICD-10-CM | POA: Diagnosis not present

## 2019-06-14 NOTE — Progress Notes (Signed)
Patient ID: Kaitlyn Walton, female   DOB: 1985/03/23, 34 y.o.   MRN: [id number]  Chief Complaint  Patient presents with  . Follow-up    HPI Kaitlyn Walton is a 34 y.o. female.  Complains of right lower abdominal pain.  Seen in ER recently and CT showed a small 2 cm right ovarian cyst, otherwise unremarkable.  HPI  Past Medical History:  Diagnosis Date  . Asthma   . Obesity   . PCOS (polycystic ovarian syndrome)     Past Surgical History:  Procedure Laterality Date  . LAPAROTOMY  06/18/2014   Procedure: LAPAROTOMY with removal of Left ectopic pregnancy;  Surgeon: Brock Bad, MD;  Location: [facility];  Service: Gynecology;;  . LUNG SURGERY    . MOUTH SURGERY    . TUBAL LIGATION  10/13/2016    Family History  Problem Relation Age of Onset  . Diabetes Mother   . Cancer Maternal Aunt   . Breast cancer Maternal Aunt   . Hypertension Maternal Grandmother   . Hypertension Maternal Grandfather   . Cancer Maternal Grandfather   . Hypertension Paternal Grandmother   . Hypertension Paternal Grandfather   . Asthma Other   . GI problems Other   . Hyperlipidemia Other   . Obesity Other   . Stroke Other   . Sleep apnea Other   . Bipolar disorder Father   . Bipolar disorder Sister     Social History Social History   Tobacco Use  . Smoking status: Never Smoker  . Smokeless tobacco: Never Used  Substance Use Topics  . Alcohol use: No    Alcohol/week: 0.0 standard drinks  . Drug use: No    Allergies  Allergen Reactions  . Cefaclor Other (See Comments)    "ceclor"= childhood allergy  . Sulfonamide Derivatives Other (See Comments)    Sulfa= childhood allergy    Current Outpatient Medications  Medication Sig Dispense Refill  . albuterol (ACCUNEB) 1.25 MG/3ML nebulizer solution Take 1 ampule by nebulization every 6 (six) hours as needed for wheezing.    Marland Kitchen albuterol (VENTOLIN HFA) 108 (90 Base) MCG/ACT inhaler Inhale 2 puffs into the lungs every 6 (six)  hours as needed for wheezing. For wheezing 18 g 0  . cetirizine (ZYRTEC ALLERGY) 10 MG tablet Take 1 tablet (10 mg total) by mouth daily. 90 tablet 0  . ibuprofen (ADVIL) 800 MG tablet Take 1 tablet (800 mg total) by mouth every 8 (eight) hours as needed. (Patient not taking: Reported on 06/06/2019) 60 tablet 5  . Multiple Vitamin (MULTIVITAMIN WITH MINERALS) TABS tablet Take 1 tablet by mouth daily.    . norethindrone (AYGESTIN) 5 MG tablet Take 1 tablet (5 mg total) by mouth daily. (Patient not taking: Reported on 06/06/2019) 30 tablet 0  . oxyCODONE-acetaminophen (PERCOCET/ROXICET) 5-325 MG tablet Take 1-2 tablets by mouth every 4 (four) hours as needed for severe pain. (Patient not taking: Reported on 01/18/2019) 30 tablet 0  . phentermine (ADIPEX-P) 37.5 MG tablet Take 37.5 mg by mouth daily.    . Vitamin D, Ergocalciferol, (DRISDOL) 1.25 MG (50000 UNIT) CAPS capsule Take 50,000 Units by mouth once a week. Mondays     No current facility-administered medications for this visit.    Review of Systems Review of Systems Constitutional: negative for fatigue and weight loss Respiratory: negative for cough and wheezing Cardiovascular: negative for chest pain, fatigue and palpitations Gastrointestinal: negative for abdominal pain and change in bowel habits Genitourinary:positive for pelvic pain Integument/breast: negative  for nipple discharge Musculoskeletal:negative for myalgias Neurological: negative for gait problems and tremors Behavioral/Psych: negative for abusive relationship, depression Endocrine: negative for temperature intolerance      Blood pressure (!) 151/91, pulse 85, weight 297 lb (134.7 kg).  CT ABDOMEN PELVIS W CONTRAST (Accession [id number]) (Order [id number]) Imaging Date: 06/06/2019 Department: [facility] Released By/Authorizing: Blanchie Dessert, MD (auto-released)  Exam Status  Status  Final [99]  PACS Intelerad Image Link  Show  images for CT ABDOMEN PELVIS W CONTRAST  Study Result  CLINICAL DATA:  34 year old female with right lower quadrant abdominal pain.  EXAM: CT ABDOMEN AND PELVIS WITH CONTRAST  TECHNIQUE: Multidetector CT imaging of the abdomen and pelvis was performed using the standard protocol following bolus administration of intravenous contrast.  CONTRAST:  173mL OMNIPAQUE IOHEXOL 300 MG/ML  SOLN  COMPARISON:  CT abdomen pelvis dated 12/12/2017.  FINDINGS: Lower chest: The visualized lung bases are clear.  No intra-abdominal free air or free fluid.  Hepatobiliary: Mild fatty infiltration of the liver. No intrahepatic biliary dilatation. The gallbladder is unremarkable.  Pancreas: Unremarkable. No pancreatic ductal dilatation or surrounding inflammatory changes.  Spleen: Normal in size without focal abnormality.  Adrenals/Urinary Tract: The adrenal glands are unremarkable. The kidneys, visualized ureters, and urinary bladder appear unremarkable.  Stomach/Bowel: There is no bowel obstruction or active inflammation. The appendix is normal.  Vascular/Lymphatic: The abdominal aorta and IVC unremarkable. No portal venous gas. There is no adenopathy.  Reproductive: The uterus is anteverted and grossly unremarkable. Probable 2 cm right ovarian dominant follicle or corpus luteum. The left ovary is unremarkable.  Other: None  Musculoskeletal: No acute or significant osseous findings.  IMPRESSION: 1. No acute intra-abdominal or pelvic pathology. Normal appendix. 2. Mild fatty liver. 3. Probable 2 cm right ovarian dominant follicle or corpus luteum.   Electronically Signed   By: Anner Crete M.D.   On: 06/06/2019 22:11     Physical Exam Physical Exam: Deferred   >50% of 15 min visit spent on counseling and coordination of care.   Data Reviewed CT of abdomen and pelvis  Assessment     1. Pelvic pain - clinically stable  2. Cyst of right ovary -  small, resolving - Ibuprofen prn  3. Class 3 severe obesity due to excess calories without serious comorbidity with body mass index (BMI) of 45.0 to 49.9 in adult [facility]) - program of caloric reduction, exercise and behavioral modification recommended    Plan  Follow up in 4 weeks   Shelly Bombard, MD 06/14/2019 10:31 AM

## 2019-06-14 NOTE — Progress Notes (Signed)
RGYN pt presents for a F/U visit from ED.Right lower quadrant abdominal pain.

## 2019-06-18 ENCOUNTER — Telehealth: Payer: Self-pay

## 2019-06-18 NOTE — Telephone Encounter (Signed)
Ibuprofen is the preferred Rx

## 2019-06-18 NOTE — Telephone Encounter (Signed)
Returned call, pt states that she started cycle on Saturday and has been having a lot of pain not relived by Ibuprofen 800mg . Pt reports hx of ovarian cyst, routed to provider for review.

## 2019-06-20 ENCOUNTER — Telehealth: Payer: Self-pay

## 2019-06-20 NOTE — Telephone Encounter (Signed)
S/w pt and advised that Dr. Clearance Coots advised to continue to take ibuprofen for the pain.

## 2019-07-12 ENCOUNTER — Encounter: Payer: Self-pay | Admitting: Obstetrics

## 2019-07-12 ENCOUNTER — Telehealth (INDEPENDENT_AMBULATORY_CARE_PROVIDER_SITE_OTHER): Payer: Medicaid Other | Admitting: Obstetrics

## 2019-07-12 VITALS — Wt 297.0 lb

## 2019-07-12 DIAGNOSIS — N83201 Unspecified ovarian cyst, right side: Secondary | ICD-10-CM | POA: Diagnosis not present

## 2019-07-12 DIAGNOSIS — Z6841 Body Mass Index (BMI) 40.0 and over, adult: Secondary | ICD-10-CM

## 2019-07-12 DIAGNOSIS — R102 Pelvic and perineal pain: Secondary | ICD-10-CM | POA: Diagnosis not present

## 2019-07-12 NOTE — Progress Notes (Signed)
Visit today is for follow-up for ovarian cysts.  She rates pain as constant, dull, at a 2.    She will join MyChart via APP at 9am.

## 2019-07-12 NOTE — Progress Notes (Signed)
    GYNECOLOGY VIRTUAL VISIT ENCOUNTER NOTE  Provider location: [facility] [place]   I connected with Kaitlyn Walton on 07/12/19 at  9:00 AM EDT by MyChart Video Encounter at home and verified that I am speaking with the correct person using two identifiers.   I discussed the limitations, risks, security and privacy concerns of performing an evaluation and management service virtually and the availability of in person appointments. I also discussed with the patient that there may be a patient responsible charge related to this service. The patient expressed understanding and agreed to proceed.   History:  Kaitlyn Walton is a 34 y.o. [phone] female being evaluated today for results of CT of Pelvis for pelvic pain. She denies any abnormal vaginal discharge, bleeding,  or other concerns.  Pelvic pain is much improved after period.     Past Medical History:  Diagnosis Date  . Asthma   . Obesity   . PCOS (polycystic ovarian syndrome)    Past Surgical History:  Procedure Laterality Date  . LAPAROTOMY  06/18/2014   Procedure: LAPAROTOMY with removal of Left ectopic pregnancy;  Surgeon: Brock Bad, MD;  Location: [facility];  Service: Gynecology;;  . LUNG SURGERY    . MOUTH SURGERY    . TUBAL LIGATION  10/13/2016   The following portions of the patient's history were reviewed and updated as appropriate: allergies, current medications, past family history, past medical history, past social history, past surgical history and problems.  Health Maintenance:  Normal pap and negative HRHPV on 12-02-2016.   Review of Systems:  Pertinent items noted in HPI and remainder of comprehensive ROS otherwise negative.  Physical Exam:   General:  Alert, oriented and cooperative. Patient appears to be in no acute distress.  Mental Status: Normal mood and affect. Normal behavior. Normal judgment and thought content.   Respiratory: Normal respiratory effort, no  problems with respiration noted  Rest of physical exam deferred due to type of encounter  Labs and Imaging No results found for this or any previous visit (from the past 336 hour(s)). No results found.     Assessment and Plan:     1. Pelvic pain - resolving  2. Cyst of right ovary - resolved  3. Class 3 severe obesity due to excess calories without serious comorbidity with body mass index (BMI) of 45.0 to 49.9 in adult [facility]) - program of caloric reduction, exercise and behavioral modification recommended       I discussed the assessment and treatment plan with the patient. The patient was provided an opportunity to ask questions and all were answered. The patient agreed with the plan and demonstrated an understanding of the instructions.   The patient was advised to call back or seek an in-person evaluation/go to the ED if the symptoms worsen or if the condition fails to improve as anticipated.  I provided 15 minutes of face-to-face time during this encounter.   Coral Ceo, MD [facility] for [facility], [facility] 07/12/19

## 2019-07-28 ENCOUNTER — Emergency Department (HOSPITAL_COMMUNITY): Payer: Medicaid Other

## 2019-07-28 ENCOUNTER — Other Ambulatory Visit: Payer: Self-pay

## 2019-07-28 ENCOUNTER — Encounter (HOSPITAL_COMMUNITY): Payer: Self-pay | Admitting: Emergency Medicine

## 2019-07-28 DIAGNOSIS — W19XXXA Unspecified fall, initial encounter: Secondary | ICD-10-CM | POA: Insufficient documentation

## 2019-07-28 DIAGNOSIS — S93431A Sprain of tibiofibular ligament of right ankle, initial encounter: Secondary | ICD-10-CM | POA: Diagnosis not present

## 2019-07-28 DIAGNOSIS — E669 Obesity, unspecified: Secondary | ICD-10-CM | POA: Diagnosis not present

## 2019-07-28 DIAGNOSIS — Y999 Unspecified external cause status: Secondary | ICD-10-CM | POA: Insufficient documentation

## 2019-07-28 DIAGNOSIS — S99911A Unspecified injury of right ankle, initial encounter: Secondary | ICD-10-CM | POA: Diagnosis present

## 2019-07-28 DIAGNOSIS — Y939 Activity, unspecified: Secondary | ICD-10-CM | POA: Diagnosis not present

## 2019-07-28 DIAGNOSIS — E282 Polycystic ovarian syndrome: Secondary | ICD-10-CM | POA: Insufficient documentation

## 2019-07-28 DIAGNOSIS — J45909 Unspecified asthma, uncomplicated: Secondary | ICD-10-CM | POA: Diagnosis not present

## 2019-07-28 DIAGNOSIS — Z882 Allergy status to sulfonamides status: Secondary | ICD-10-CM | POA: Diagnosis not present

## 2019-07-28 DIAGNOSIS — Y929 Unspecified place or not applicable: Secondary | ICD-10-CM | POA: Diagnosis not present

## 2019-07-28 DIAGNOSIS — Z79899 Other long term (current) drug therapy: Secondary | ICD-10-CM | POA: Insufficient documentation

## 2019-07-28 DIAGNOSIS — S93601A Unspecified sprain of right foot, initial encounter: Secondary | ICD-10-CM | POA: Diagnosis not present

## 2019-07-28 DIAGNOSIS — M25571 Pain in right ankle and joints of right foot: Secondary | ICD-10-CM | POA: Insufficient documentation

## 2019-07-28 NOTE — ED Triage Notes (Signed)
Patient c/o right ankle pain after trip and fall today. States pain worsens with movement.

## 2019-07-29 ENCOUNTER — Emergency Department (HOSPITAL_COMMUNITY)
Admission: EM | Admit: 2019-07-29 | Discharge: 2019-07-29 | Disposition: A | Discharge status: Home / Self Care | Payer: Medicaid Other | Attending: Emergency Medicine | Admitting: Emergency Medicine

## 2019-07-29 ENCOUNTER — Emergency Department (HOSPITAL_COMMUNITY): Payer: Medicaid Other

## 2019-07-29 DIAGNOSIS — S93491A Sprain of other ligament of right ankle, initial encounter: Secondary | ICD-10-CM

## 2019-07-29 DIAGNOSIS — S93601A Unspecified sprain of right foot, initial encounter: Secondary | ICD-10-CM

## 2019-07-29 MED ORDER — ACETAMINOPHEN 500 MG PO TABS
1000.0000 mg | ORAL_TABLET | Freq: Once | ORAL | Status: AC
  Administered 2019-07-29: 1000 mg via ORAL
  Filled 2019-07-29: qty 2

## 2019-07-29 NOTE — ED Provider Notes (Signed)
[facility] DEPT Provider Note  CSN: [id number] Arrival date & time: 07/28/19 2047  Chief Complaint(s) Fall and Ankle Pain  HPI Kaitlyn Walton is a 34 y.o. female   CC: Foot pain  Context: Patient rolled her ankle while coming down steps.  Onset/Duration: 1 day, sudden Timing: Constant Location: Right Quality: Aching/throbbing Severity: Moderate to severe Modifying Factors:  Improved by: Immobility  Worsened by: Palpation/ambulation Associated Signs/Symptoms:  Pertinent (+): Mild swelling  Pertinent (-): Ecchymosis, laceration, wound.  No other injuries.  No neck pain, back pain, headache, chest pain, abdominal pain or other extremity pain.  HPI  Past Medical History Past Medical History:  Diagnosis Date  . Asthma   . Obesity   . PCOS (polycystic ovarian syndrome)    Patient Active Problem List   Diagnosis Date Noted  . Ectopic pregnancy, tubal 06/18/2014  . Obesity 07/27/2013  . PCOS (polycystic ovarian syndrome) 07/27/2013  . Abnormal uterine bleeding (AUB) 06/11/2013  . Asthma 06/07/2012  . Monilial rash 05/23/2012  . Asthma complicating pregnancy, antepartum 05/10/2012  . History of PCOS 02/20/2012   Home Medication(s) Prior to Admission medications   Medication Sig Start Date End Date Taking? Authorizing Provider  albuterol (ACCUNEB) 1.25 MG/3ML nebulizer solution Take 1 ampule by nebulization every 6 (six) hours as needed for wheezing.    [provider]  albuterol (VENTOLIN HFA) 108 (90 Base) MCG/ACT inhaler Inhale 2 puffs into the lungs every 6 (six) hours as needed for wheezing. For wheezing 08/03/18   Jaynee Eagles, PA-C  cetirizine (ZYRTEC ALLERGY) 10 MG tablet Take 1 tablet (10 mg total) by mouth daily. 08/03/18   Jaynee Eagles, PA-C  ibuprofen (ADVIL) 800 MG tablet Take 1 tablet (800 mg total) by mouth every 8 (eight) hours as needed. 01/04/19   Shelly Bombard, MD  Multiple Vitamin (MULTIVITAMIN WITH  MINERALS) TABS tablet Take 1 tablet by mouth daily.    [provider]  dicyclomine (BENTYL) 20 MG tablet Take 1 tablet (20 mg total) by mouth every 6 (six) hours. Patient not taking: Reported on 08/03/2018 12/14/17 08/03/18  Shelly Bombard, MD  medroxyPROGESTERone (PROVERA) 10 MG tablet Take 1 tablet (10 mg total) by mouth daily. Patient not taking: Reported on 04/19/2017 04/04/17 08/03/18  Shelly Bombard, MD  MedroxyPROGESTERone Acetate 150 MG/ML SUSY INJECT 1ML INTO THE MUSCLE EVERY 3 MONTHS Patient not taking: Reported on 12/02/2016 09/06/16 08/03/18  Shelly Bombard, MD                                                                                                                                    Past Surgical History Past Surgical History:  Procedure Laterality Date  . LAPAROTOMY  06/18/2014   Procedure: LAPAROTOMY with removal of Left ectopic pregnancy;  Surgeon: Shelly Bombard, MD;  Location: [facility];  Service: Gynecology;;  . LUNG SURGERY    . MOUTH SURGERY    .  TUBAL LIGATION  10/13/2016   Family History Family History  Problem Relation Age of Onset  . Diabetes Mother   . Cancer Maternal Aunt   . Breast cancer Maternal Aunt   . Hypertension Maternal Grandmother   . Hypertension Maternal Grandfather   . Cancer Maternal Grandfather   . Hypertension Paternal Grandmother   . Hypertension Paternal Grandfather   . Asthma Other   . GI problems Other   . Hyperlipidemia Other   . Obesity Other   . Stroke Other   . Sleep apnea Other   . Bipolar disorder Father   . Bipolar disorder Sister     Social History Social History   Tobacco Use  . Smoking status: Never Smoker  . Smokeless tobacco: Never Used  Vaping Use  . Vaping Use: Never used  Substance Use Topics  . Alcohol use: No    Alcohol/week: 0.0 standard drinks  . Drug use: No   Allergies Cefaclor and Sulfonamide derivatives  Review of Systems Review of Systems All other systems are reviewed and are  negative for acute change except as noted in the HPI  Physical Exam Vital Signs  I have reviewed the triage vital signs BP 118/74   Pulse 73   Temp 98.2 F (36.8 C) (Oral)   Resp 16   Ht 5\' 4"  (1.626 m)   Wt 134.7 kg   LMP 07/27/2019 (Exact Date)   SpO2 99%   BMI 50.98 kg/m   Physical Exam Vitals reviewed.  Constitutional:      General: She is not in acute distress.    Appearance: She is well-developed. She is obese. She is not diaphoretic.  HENT:     Head: Normocephalic and atraumatic.     Right Ear: External ear normal.     Left Ear: External ear normal.     Nose: Nose normal.  Eyes:     General: No scleral icterus.    Conjunctiva/sclera: Conjunctivae normal.  Neck:     Trachea: Phonation normal.  Cardiovascular:     Rate and Rhythm: Normal rate and regular rhythm.  Pulmonary:     Effort: Pulmonary effort is normal. No respiratory distress.     Breath sounds: No stridor.  Abdominal:     General: There is no distension.  Musculoskeletal:        General: Normal range of motion.     Cervical back: Normal range of motion.     Right ankle: Swelling (mild) present. No deformity, ecchymosis or lacerations. Tenderness present over the AITF ligament and base of 5th metatarsal. No lateral malleolus or medial malleolus tenderness. Normal range of motion. Normal pulse.  Neurological:     Mental Status: She is alert and oriented to person, place, and time.  Psychiatric:        Behavior: Behavior normal.     ED Results and Treatments Labs (all labs ordered are listed, but only abnormal results are displayed) Labs Reviewed - No data to display  EKG  EKG Interpretation  Date/Time:    Ventricular Rate:    PR Interval:    QRS Duration:   QT Interval:    QTC Calculation:   R Axis:     Text Interpretation:        Radiology DG Ankle Complete  Right  Result Date: 07/28/2019 CLINICAL DATA:  Right lateral ankle pain, tripped and fell EXAM: RIGHT ANKLE - COMPLETE 3+ VIEW COMPARISON:  None. FINDINGS: Frontal, oblique, and lateral views of the right ankle are obtained. No fracture, subluxation, or dislocation. Joint spaces are well preserved. Soft tissues are normal. IMPRESSION: 1. Unremarkable right ankle. Electronically Signed   By: Sharlet Salina M.D.   On: 07/28/2019 22:45   DG Foot 2 Views Right  Result Date: 07/29/2019 CLINICAL DATA:  Right foot pain EXAM: RIGHT FOOT - 2 VIEW COMPARISON:  None. FINDINGS: There is no evidence of fracture or dislocation. There is no evidence of arthropathy or other focal bone abnormality. Soft tissues are unremarkable. IMPRESSION: Negative. Electronically Signed   By: Deatra Robinson M.D.   On: 07/29/2019 01:40    Pertinent labs & imaging results that were available during my care of the patient were reviewed by me and considered in my medical decision making (see chart for details).  Medications Ordered in ED Medications  acetaminophen (TYLENOL) tablet 1,000 mg (1,000 mg Oral Given 07/29/19 0141)                                                                                                                                    Procedures Procedures  (including critical care time)  Medical Decision Making / ED Course I have reviewed the nursing notes for this encounter and the patient's prior records (if available in EHR or on provided paperwork).   Rukia Mcgillivray Delage was evaluated in Emergency Department on 07/29/2019 for the symptoms described in the history of present illness. She was evaluated in the context of the global COVID-19 pandemic, which necessitated consideration that the patient might be at risk for infection with the SARS-CoV-2 virus that causes COVID-19. Institutional protocols and algorithms that pertain to the evaluation of patients at risk for COVID-19 are in a state of rapid  change based on information released by regulatory bodies including the CDC and federal and state organizations. These policies and algorithms were followed during the patient's care in the ED.  Plain films negative.  Consistent with ankle/foot sprain.  Provided with a cam walker.  No other injuries requiring imaging or work-up at this time.      Final Clinical Impression(s) / ED Diagnoses Final diagnoses:  Sprain of anterior talofibular ligament of right ankle, initial encounter  Sprain of right foot, initial encounter   The patient appears reasonably screened and/or stabilized for discharge and I doubt any other medical condition or other [facility] requiring further screening, evaluation, or treatment in the ED at this time prior to  discharge. Safe for discharge with strict return precautions.  Disposition: Discharge  Condition: Good  I have discussed the results, Dx and Tx plan with the patient/family who expressed understanding and agree(s) with the plan. Discharge instructions discussed at length. The patient/family was given strict return precautions who verbalized understanding of the instructions. No further questions at time of discharge.    ED Discharge Orders    None        Follow Up: [facility] [address] [place] [address] [phone]  Schedule an appointment as soon as possible for a visit  As needed      This chart was dictated using voice recognition software.  Despite best efforts to proofread,  errors can occur which can change the documentation meaning.   Nira Conn, MD 07/29/19 [phone]

## 2019-08-26 ENCOUNTER — Ambulatory Visit (INDEPENDENT_AMBULATORY_CARE_PROVIDER_SITE_OTHER): Payer: Medicaid Other | Admitting: Podiatry

## 2019-08-26 ENCOUNTER — Ambulatory Visit (INDEPENDENT_AMBULATORY_CARE_PROVIDER_SITE_OTHER): Payer: Medicaid Other

## 2019-08-26 ENCOUNTER — Other Ambulatory Visit: Payer: Self-pay

## 2019-08-26 ENCOUNTER — Encounter: Payer: Self-pay | Admitting: Podiatry

## 2019-08-26 DIAGNOSIS — M25571 Pain in right ankle and joints of right foot: Secondary | ICD-10-CM

## 2019-08-26 DIAGNOSIS — M25579 Pain in unspecified ankle and joints of unspecified foot: Secondary | ICD-10-CM

## 2019-08-26 DIAGNOSIS — IMO0001 Reserved for inherently not codable concepts without codable children: Secondary | ICD-10-CM

## 2019-08-26 DIAGNOSIS — S9304XA Dislocation of right ankle joint, initial encounter: Secondary | ICD-10-CM

## 2019-08-26 DIAGNOSIS — S93401A Sprain of unspecified ligament of right ankle, initial encounter: Secondary | ICD-10-CM

## 2019-08-26 NOTE — Patient Instructions (Addendum)
Ankle Sprain  An ankle sprain is a stretch or tear in one of the tough tissues (ligaments) that connect the bones in your ankle. An ankle sprain can happen when the ankle rolls outward (inversion sprain) or inward (eversion sprain). What are the causes? This condition is caused by rolling or twisting the ankle. What increases the risk? You are more likely to develop this condition if you play sports. What are the signs or symptoms? Symptoms of this condition include:  Pain in your ankle.  Swelling.  Bruising. This may happen right after you sprain your ankle or 1-2 days later.  Trouble standing or walking. How is this diagnosed? This condition is diagnosed with:  A physical exam. During the exam, your doctor will press on certain parts of your foot and ankle and try to move them in certain ways.  X-ray imaging. These may be taken to see how bad the sprain is and to check for broken bones. How is this treated? This condition may be treated with:  A brace or splint. This is used to keep the ankle from moving until it heals.  An elastic bandage. This is used to support the ankle.  Crutches.  Pain medicine.  Surgery. This may be needed if the sprain is very bad.  Physical therapy. This may help to improve movement in the ankle. Follow these instructions at home: If you have a brace or a splint:  Wear the brace or splint as told by your doctor. Remove it only as told by your doctor.  Loosen the brace or splint if your toes: ? Tingle. ? Lose feeling (become numb). ? Turn cold and blue.  Keep the brace or splint clean.  If the brace or splint is not waterproof: ? Do not let it get wet. ? Cover it with a watertight covering when you take a bath or a shower. If you have an elastic bandage (dressing):  Remove it to shower or bathe.  Try not to move your ankle much, but wiggle your toes from time to time. This helps to prevent swelling.  Adjust the dressing if it feels  too tight.  Loosen the dressing if your foot: ? Loses feeling. ? Tingles. ? Becomes cold and blue. Managing pain, stiffness, and swelling   Take over-the-counter and prescription medicines only as told by doctor.  For 2-3 days, keep your ankle raised (elevated) above the level of your heart.  If told, put ice on the injured area: ? If you have a removable brace or splint, remove it as told by your doctor. ? Put ice in a plastic bag. ? Place a towel between your skin and the bag. ? Leave the ice on for 20 minutes, 2-3 times a day. General instructions  Rest your ankle.  Do not use your injured leg to support your body weight until your doctor says that you can. Use crutches as told by your doctor.  Do not use any products that contain nicotine or tobacco, such as cigarettes, e-cigarettes, and chewing tobacco. If you need help quitting, ask your doctor.  Keep all follow-up visits as told by your doctor. Contact a doctor if:  Your bruises or swelling are quickly getting worse.  Your pain does not get better after you take medicine. Get help right away if:  You cannot feel your toes or foot.  Your foot or toes look blue.  You have very bad pain that gets worse. Summary  An ankle sprain is a stretch   or tear in one of the tough tissues (ligaments) that connect the bones in your ankle.  This condition is caused by rolling or twisting the ankle.  Symptoms include pain, swelling, bruising, and trouble walking.  To help with pain and swelling, put ice on the injured ankle, raise your ankle above the level of your heart, and use an elastic bandage. Also, rest as told by your doctor.  Keep all follow-up visits as told by your doctor. This is important. This information is not intended to replace advice given to you by your health care provider. Make sure you discuss any questions you have with your health care provider. Document Revised: 06/13/2017 Document Reviewed:  06/13/2017 Elsevier Patient Education  2020 [employer]   Ankle Sprain   An ankle sprain is a stretch or tear in a ligament in the ankle. Ligaments are tissues that connect bones to each other. The two most common types of ankle sprains are: 1. Inversion sprain. This happens when the foot turns inward and the ankle rolls outward. It affects the ligament on the outside of the foot (lateral ligament). 2. Eversion sprain. This happens when the foot turns outward and the ankle rolls inward. It affects the ligament on the inner side of the foot (medial ligament). What are the causes? This condition is often caused by accidentally rolling or twisting the ankle. What increases the risk? You are more likely to develop this condition if you play sports. What are the signs or symptoms?  Symptoms of this condition include: 1. Pain in your ankle. 2. Swelling. 3. Bruising. This may develop right after you sprain your ankle or 1-2 days later. 4. Trouble standing or walking, especially when you turn or change directions. How is this diagnosed? This condition is diagnosed with: 1. A physical exam. During the exam, your health care provider will press on certain parts of your foot and ankle and try to move them in certain ways. 2. X-ray imaging. These may be taken to see how severe the sprain is and to check for broken bones. How is this treated? This condition may be treated with: 1. A brace or splint. This is used to keep the ankle from moving until it heals. 2. An elastic bandage. This is used to support the ankle. 3. Crutches. 4. Pain medicine. 5. Surgery. This may be needed if the sprain is severe. 6. Physical therapy. This may help to improve the range of motion in the ankle. Follow these instructions at home: If you have a brace or a splint: 1. Wear the brace or splint as told by your health care provider. Remove it only as told by your health care provider. 2. Loosen the brace or  splint if your toes tingle, become numb, or turn cold and blue. 3. Keep the brace or splint clean. 4. If the brace or splint is not waterproof: 1. Do not let it get wet. 2. Cover it with a watertight covering when you take a bath or a shower. If you have an elastic bandage (dressing):  Remove it to shower or bathe.  Try not to move your ankle much, but wiggle your toes from time to time. This helps to prevent swelling.  Adjust the dressing to make it more comfortable if it feels too tight.  Loosen the dressing if you have numbness or tingling in your foot, or if your foot becomes cold and blue. Managing pain, stiffness, and swelling   1. Take over-the-counter and prescription medicines only  as told by your health care provider. 2. For 2-3 days, keep your ankle raised (elevated) above the level of your heart as much as possible. 3. If directed, put ice on the injured area: ? If you have a removable brace or splint, remove it as told by your health care provider. ? Put ice in a plastic bag. ? Place a towel between your skin and the bag. ? Leave the ice on for 20 minutes, 2-3 times a day. General instructions  Rest your ankle.  Do not use the injured limb to support your body weight until your health care provider says that you can. Use crutches as told by your health care provider.  Do not use any products that contain nicotine or tobacco, such as cigarettes, e-cigarettes, and chewing tobacco. If you need help quitting, ask your health care provider.  Keep all follow-up visits as told by your health care provider. This is important. Contact a health care provider if:  You have rapidly increasing bruising or swelling.  Your pain is not relieved with medicine. Get help right away if:  Your foot or toes become numb or blue.  You have severe pain that gets worse. Summary  An ankle sprain is a stretch or tear in a ligament in the ankle. Ligaments are tissues that connect bones  to each other.  This condition is often caused by accidentally rolling or twisting the ankle.  Symptoms include pain, swelling, bruising, and trouble walking.  To relieve pain and swelling, put ice on the affected ankle, raise your ankle above the level of your heart, and use an elastic bandage.  Keep all follow-up visits as told by your health care provider. This is important. This information is not intended to replace advice given to you by your health care provider. Make sure you discuss any questions you have with your health care provider. Document Revised: 10/09/2017 Document Reviewed: 06/13/2017 Elsevier Patient Education  2020 [employer] Inc[employer]  Ankle Sprain, Phase I Rehab An ankle sprain is an injury to the ligaments of your ankle. Ankle sprains cause stiffness, loss of motion, and loss of strength. Ask your health care provider which exercises are safe for you. Do exercises exactly as told by your health care provider and adjust them as directed. It is normal to feel mild stretching, pulling, tightness, or discomfort as you do these exercises. Stop right away if you feel sudden pain or your pain gets worse. Do not begin these exercises until told by your health care provider. Stretching and range-of-motion exercises These exercises warm up your muscles and joints and improve the movement and flexibility of your lower leg and ankle. These exercises also help to relieve pain and stiffness. Gastroc and soleus stretch  This exercise is also called a calf stretch. It stretches the muscles in the back of the lower leg. These muscles are the gastrocnemius, or gastroc, and the soleus. 3. Sit on the floor with your left / right leg extended. 4. Loop a belt or towel around the ball of your left / right foot. The ball of your foot is on the walking surface, right under your toes. 5. Keep your left / right ankle and foot relaxed and keep your knee straight while you use the belt or towel to pull  your foot toward you. You should feel a gentle stretch behind your calf or knee in your gastroc muscle. 6. Hold this position for 15 seconds, then release to the starting position. 7. Repeat the  exercise with your knee bent. You can put a pillow or a rolled bath towel under your knee to support it. You should feel a stretch deep in your calf in the soleus muscle or at your Achilles tendon. Repeat 5 times. Complete this exercise 2 times a day. Ankle alphabet   5. Sit with your left / right leg supported at the lower leg. ? Do not rest your foot on anything. ? Make sure your foot has room to move freely. 6. Think of your left / right foot as a paintbrush. ? Move your foot to trace each letter of the alphabet in the air. Keep your hip and knee still while you trace. ? Make the letters as large as you can without feeling discomfort. 7. Trace every letter from A to Z. Repeat 5 times. Complete this exercise 2 times a day. Strengthening exercises These exercises build strength and endurance in your ankle and lower leg. Endurance is the ability to use your muscles for a long time, even after they get tired. Ankle dorsiflexion   3. Secure a rubber exercise band or tube to an object, such as a table leg, that will stay still when the band is pulled. Secure the other end around your left / right foot. 4. Sit on the floor facing the object, with your left / right leg extended. The band or tube should be slightly tense when your foot is relaxed. 5. Slowly bring your foot toward you, bringing the top of your foot toward your shin (dorsiflexion), and pulling the band tighter. 6. Hold this position for 15 seconds. 7. Slowly return your foot to the starting position. Repeat 5 times. Complete this exercise 2 times a day. Ankle plantar flexion   7. Sit on the floor with your left / right leg extended. 8. Loop a rubber exercise tube or band around the ball of your left / right foot. The ball of your foot  is on the walking surface, right under your toes. ? Hold the ends of the band or tube in your hands. ? The band or tube should be slightly tense when your foot is relaxed. 9. Slowly point your foot and toes downward to tilt the top of your foot away from your shin (plantar flexion). 10. Hold this position for 15 seconds. 11. Slowly return your foot to the starting position. Repeat 5 times. Complete this exercise 2times a day. Ankle eversion 5. Sit on the floor with your legs straight out in front of you. 6. Loop a rubber exercise band or tube around the ball of your left / right foot. The ball of your foot is on the walking surface, right under your toes. 1. Hold the ends of the band in your hands, or secure the band to a stable object. 2. The band or tube should be slightly tense when your foot is relaxed. 7. Slowly push your foot outward, away from your other leg (eversion). 8. Hold this position for 15 seconds. 9. Slowly return your foot to the starting position. Repeat 5 times. Complete this exercise 2 times a day. This information is not intended to replace advice given to you by your health care provider. Make sure you discuss any questions you have with your health care provider. Document Revised: 05/08/2018 Document Reviewed: 10/30/2017 Elsevier Patient Education  2020 [employer].

## 2019-08-26 NOTE — Progress Notes (Signed)
°  Subjective:  Patient ID: Kaitlyn Walton, female    DOB: [birth year],  MRN: [id number]  Chief Complaint  Patient presents with   Ankle Pain    Pt injuried right ankle a month ago- wants seen in ER- then by PCP- she is still having pain and requesting further evaluation    34 y.o. female presents with the above complaint. History confirmed with patient. She twisted and fell while moving out of a home, she fell through a floor in the bathroom. No laceration. Was seen at [facility] ED and was told it was a sprain, x-rays of the ankle were taken and she was given a lace up brace but says this was too big.  Objective:  Physical Exam: warm, good capillary refill, no trophic changes or ulcerative lesions, normal DP and PT pulses and normal sensory exam. Left Foot: normal exam, no swelling, tenderness, instability; ligaments intact, full range of motion of all ankle/foot joints  Right Foot: tenderness and edema over lateral foot and ankle, along peroneal tendons and w/ resisted eversion. + peroneal dislocation with circumduction of foot which is painful. Pain over sinus tarsi. Negative anterior drawer and talar tilt. No syndesmotic pain w/ tib fib squeeze   Radiographs: X-ray of right foot and ankle, leg: Mortise is intact, no fracture noted, no dislocation or degenerative changes, no fibular fracture proximally. Compared to films from [facility] ED 07/28/19 and no changes noted. Assessment:   1. Moderate ankle sprain, right, initial encounter   2. Subluxation of peroneal tendon, right, initial encounter   3. Acute right ankle pain      Plan:  Patient was evaluated and treated and all questions answered.  - She suffered a moderate to severe ankle sprain without fracture or syndesmotic injury. Compression ankle gauntlet dispensed, this may help support and with fit of lace up ankle brace that she has.  - Stretching and ROM exercises given.  - She does have evidence of peroneal tendon injury with  possible SPR dysfunction. Will order an MRI to evaluate this. She states her Medicaid is no longer accepted by [facility], will see if she can have MRI at [facility]  Return in about 4 weeks (around 09/23/2019).    Sharl Ma, DPM 08/26/2019

## 2019-08-26 NOTE — Progress Notes (Signed)
d 

## 2019-08-28 ENCOUNTER — Ambulatory Visit: Payer: Medicaid Other | Admitting: Podiatrist

## 2019-09-24 ENCOUNTER — Ambulatory Visit: Payer: Medicaid Other | Admitting: Podiatry

## 2019-11-20 ENCOUNTER — Ambulatory Visit: Payer: Medicaid Other | Attending: Physician Assistant | Admitting: Physical Therapy

## 2020-02-22 ENCOUNTER — Encounter (INDEPENDENT_AMBULATORY_CARE_PROVIDER_SITE_OTHER): Payer: Self-pay

## 2020-03-26 ENCOUNTER — Emergency Department (HOSPITAL_COMMUNITY)
Admission: EM | Admit: 2020-03-26 | Discharge: 2020-03-26 | Disposition: A | Discharge status: Home / Self Care | Payer: Medicaid Other | Attending: Emergency Medicine | Admitting: Emergency Medicine

## 2020-03-26 ENCOUNTER — Encounter (HOSPITAL_COMMUNITY): Payer: Self-pay | Admitting: Emergency Medicine

## 2020-03-26 ENCOUNTER — Emergency Department (HOSPITAL_COMMUNITY): Payer: Medicaid Other

## 2020-03-26 DIAGNOSIS — J45909 Unspecified asthma, uncomplicated: Secondary | ICD-10-CM | POA: Diagnosis not present

## 2020-03-26 DIAGNOSIS — M545 Low back pain, unspecified: Secondary | ICD-10-CM | POA: Diagnosis not present

## 2020-03-26 DIAGNOSIS — R102 Pelvic and perineal pain: Secondary | ICD-10-CM | POA: Insufficient documentation

## 2020-03-26 DIAGNOSIS — R1030 Lower abdominal pain, unspecified: Secondary | ICD-10-CM | POA: Diagnosis not present

## 2020-03-26 DIAGNOSIS — N939 Abnormal uterine and vaginal bleeding, unspecified: Secondary | ICD-10-CM | POA: Insufficient documentation

## 2020-03-26 LAB — CBC WITH DIFFERENTIAL/PLATELET
Abs Immature Granulocytes: 0.04 10*3/uL (ref 0.00–0.07)
Basophils Absolute: 0.2 10*3/uL — ABNORMAL HIGH (ref 0.0–0.1)
Basophils Relative: 1 %
Eosinophils Absolute: 1.3 10*3/uL — ABNORMAL HIGH (ref 0.0–0.5)
Eosinophils Relative: 11 %
HCT: 44.7 % (ref 36.0–46.0)
Hemoglobin: 13.9 g/dL (ref 12.0–15.0)
Immature Granulocytes: 0 %
Lymphocytes Relative: 21 %
Lymphs Abs: 2.5 10*3/uL (ref 0.7–4.0)
MCH: 28 pg (ref 26.0–34.0)
MCHC: 31.1 g/dL (ref 30.0–36.0)
MCV: 89.9 fL (ref 80.0–100.0)
Monocytes Absolute: 0.8 10*3/uL (ref 0.1–1.0)
Monocytes Relative: 6 %
Neutro Abs: 7.3 10*3/uL (ref 1.7–7.7)
Neutrophils Relative %: 61 %
Platelets: 400 10*3/uL (ref 150–400)
RBC: 4.97 MIL/uL (ref 3.87–5.11)
RDW: 13.1 % (ref 11.5–15.5)
WBC: 12 10*3/uL — ABNORMAL HIGH (ref 4.0–10.5)
nRBC: 0 % (ref 0.0–0.2)

## 2020-03-26 LAB — URINALYSIS, ROUTINE W REFLEX MICROSCOPIC
Bilirubin Urine: NEGATIVE
Glucose, UA: NEGATIVE mg/dL
Ketones, ur: NEGATIVE mg/dL
Nitrite: NEGATIVE
Protein, ur: NEGATIVE mg/dL
RBC / HPF: >50 RBC/hpf — ABNORMAL HIGH (ref 0–5)
Specific Gravity, Urine: 1.013 (ref 1.005–1.030)
pH: 5 (ref 5.0–8.0)

## 2020-03-26 LAB — COMPREHENSIVE METABOLIC PANEL
ALT: 61 U/L — ABNORMAL HIGH (ref 0–44)
AST: 35 U/L (ref 15–41)
Albumin: 4.1 g/dL (ref 3.5–5.0)
Alkaline Phosphatase: 65 U/L (ref 38–126)
Anion gap: 10 (ref 5–15)
BUN: 10 mg/dL (ref 6–20)
CO2: 23 mmol/L (ref 22–32)
Calcium: 9 mg/dL (ref 8.9–10.3)
Chloride: 107 mmol/L (ref 98–111)
Creatinine, Ser: 0.76 mg/dL (ref 0.44–1.00)
GFR, Estimated: >60 mL/min (ref >60)
Glucose, Bld: 106 mg/dL — ABNORMAL HIGH (ref 70–99)
Potassium: 3.9 mmol/L (ref 3.5–5.1)
Sodium: 140 mmol/L (ref 135–145)
Total Bilirubin: 0.7 mg/dL (ref 0.3–1.2)
Total Protein: 7.1 g/dL (ref 6.5–8.1)

## 2020-03-26 LAB — I-STAT BETA HCG BLOOD, ED (MC, WL, AP ONLY): I-stat hCG, quantitative: <5 m[IU]/mL (ref <5)

## 2020-03-26 LAB — WET PREP, GENITAL
Clue Cells Wet Prep HPF POC: NONE SEEN
Sperm: NONE SEEN
Trich, Wet Prep: NONE SEEN
Yeast Wet Prep HPF POC: NONE SEEN

## 2020-03-26 LAB — LIPASE, BLOOD: Lipase: 33 U/L (ref 11–51)

## 2020-03-26 MED ORDER — NAPROXEN 500 MG PO TABS: 500.0000 mg | ORAL_TABLET | Freq: Two times a day (BID) | ORAL | 0 refills | Status: DC

## 2020-03-26 MED ORDER — HYDROMORPHONE HCL 1 MG/ML IJ SOLN
0.5000 mg | Freq: Once | INTRAMUSCULAR | Status: AC
  Administered 2020-03-26: 0.5 mg via INTRAVENOUS
  Filled 2020-03-26: qty 1

## 2020-03-26 MED ORDER — ONDANSETRON HCL 4 MG/2ML IJ SOLN
4.0000 mg | Freq: Once | INTRAMUSCULAR | Status: AC
  Administered 2020-03-26: 4 mg via INTRAVENOUS
  Filled 2020-03-26: qty 2

## 2020-03-26 MED ORDER — OXYCODONE-ACETAMINOPHEN 5-325 MG PO TABS
1.0000 | ORAL_TABLET | Freq: Once | ORAL | Status: AC
  Administered 2020-03-26: 1 via ORAL
  Filled 2020-03-26: qty 1

## 2020-03-26 MED ORDER — KETOROLAC TROMETHAMINE 30 MG/ML IJ SOLN
30.0000 mg | Freq: Once | INTRAMUSCULAR | Status: AC
  Administered 2020-03-26: 30 mg via INTRAVENOUS
  Filled 2020-03-26: qty 1

## 2020-03-26 MED ORDER — HYDROCODONE-ACETAMINOPHEN 5-325 MG PO TABS: 1.0000 | ORAL_TABLET | Freq: Four times a day (QID) | ORAL | 0 refills | Status: AC | PRN

## 2020-03-26 MED ORDER — TRIAMCINOLONE ACETONIDE 0.1 % EX CREA: 1.0000 "application " | TOPICAL_CREAM | Freq: Two times a day (BID) | CUTANEOUS | 0 refills | Status: AC

## 2020-03-26 NOTE — ED Notes (Signed)
Patient transported to Ultrasound 

## 2020-03-26 NOTE — Discharge Instructions (Signed)
Please read and follow all provided instructions.  Your diagnoses today include:  1. Pelvic pain   2. Pelvic pain in female   3. Vaginal bleeding     Tests performed today include:  Blood cell counts and platelets - slightly high white blood cell count  Kidney and liver function tests  Pancreas function test (called lipase)  Urine test to look for infection  A blood or urine test for pregnancy (women only) - no pregnancy  Ultrasound of the pelvis - does not show any problems today  Vital signs. See below for your results today.   Medications prescribed:   Naproxen - anti-inflammatory pain medication  Do not exceed 500mg  naproxen every 12 hours, take with food  You have been prescribed an anti-inflammatory medication or NSAID. Take with food. Take smallest effective dose for the shortest duration needed for your pain. Stop taking if you experience stomach pain or vomiting.    Vicodin (hydrocodone/acetaminophen) - narcotic pain medication  DO NOT drive or perform any activities that require you to be awake and alert because this medicine can make you drowsy. BE VERY CAREFUL not to take multiple medicines containing Tylenol (also called acetaminophen). Doing so can lead to an overdose which can damage your liver and cause liver failure and possibly death.   Triamcinolone (Kenalog) cream - topical steroid medication for skin reaction  Take any prescribed medications only as directed.  Home care instructions:   Follow any educational materials contained in this packet.  Follow-up instructions: Please follow-up with your primary care provider in the next 2 days for further evaluation of your symptoms.    Return instructions:  SEEK IMMEDIATE MEDICAL ATTENTION IF:  The pain does not go away or becomes severe   A temperature above 101F develops   Repeated vomiting occurs (multiple episodes)   The pain becomes localized to portions of the abdomen. The right side could  possibly be appendicitis. In an adult, the left lower portion of the abdomen could be colitis or diverticulitis.   Blood is being passed in stools or vomit (bright red or black tarry stools)   You develop chest pain, difficulty breathing, dizziness or fainting, or become confused, poorly responsive, or inconsolable (young children)  If you have any other emergent concerns regarding your health  Additional Information: Abdominal (belly) pain can be caused by many things. Your caregiver performed an examination and possibly ordered blood/urine tests and imaging (CT scan, x-rays, ultrasound). Many cases can be observed and treated at home after initial evaluation in the emergency department. Even though you are being discharged home, abdominal pain can be unpredictable. Therefore, you need a repeated exam if your pain does not resolve, returns, or worsens. Most patients with abdominal pain don't have to be admitted to the hospital or have surgery, but serious problems like appendicitis and gallbladder attacks can start out as nonspecific pain. Many abdominal conditions cannot be diagnosed in one visit, so follow-up evaluations are very important.  Your vital signs today were: BP 121/66 (BP Location: Right Arm)   Pulse 68   Temp 98.3 F (36.8 C) (Oral)   Resp 18   SpO2 97%  If your blood pressure (bp) was elevated above 135/85 this visit, please have this repeated by your doctor within one month. --------------

## 2020-03-26 NOTE — ED Provider Notes (Signed)
[facility] EMERGENCY DEPARTMENT Provider Note   CSN: [id number] Arrival date & time: 03/26/20  0347     History No chief complaint on file.   Kaitlyn Walton is a 35 y.o. female.  Patient with history of PCOS, previous right-sided ectopic pregnancy status post salpingectomy, left-sided tubal ligation -- presents to the emergency department for evaluation of pelvic pain, vaginal bleeding starting about 2 days ago.  Patient has lower abdominal pain with radiation to the lower back.  She has irregular menstrual periods at baseline and thought initially she was having normal period cramping, however pain is more severe and bleeding is heavier than a typical period.  In addition she has had associated nausea without vomiting and watery nonbloody diarrhea.  No hematuria or irritative UTI symptoms including dysuria, increased frequency or urgency.  She has tried ibuprofen without relief.        Past Medical History:  Diagnosis Date  . Asthma   . Obesity   . PCOS (polycystic ovarian syndrome)     Patient Active Problem List   Diagnosis Date Noted  . Ectopic pregnancy, tubal 06/18/2014  . Obesity 07/27/2013  . PCOS (polycystic ovarian syndrome) 07/27/2013  . Abnormal uterine bleeding (AUB) 06/11/2013  . Asthma 06/07/2012  . Monilial rash 05/23/2012  . Asthma complicating pregnancy, antepartum 05/10/2012  . History of PCOS 02/20/2012    Past Surgical History:  Procedure Laterality Date  . LAPAROTOMY  06/18/2014   Procedure: LAPAROTOMY with removal of Left ectopic pregnancy;  Surgeon: Brock Bad, MD;  Location: [facility] ORS;  Service: Gynecology;;  . LUNG SURGERY    . MOUTH SURGERY    . TUBAL LIGATION  10/13/2016     OB History    Gravida  5   Para  3   Term  3   Preterm  0   AB  1   Living  3     SAB  1   IAB  0   Ectopic  0   Multiple  0   Live Births  3           Family History  Problem Relation Age of Onset  . Diabetes  Mother   . Cancer Maternal Aunt   . Breast cancer Maternal Aunt   . Hypertension Maternal Grandmother   . Hypertension Maternal Grandfather   . Cancer Maternal Grandfather   . Hypertension Paternal Grandmother   . Hypertension Paternal Grandfather   . Asthma Other   . GI problems Other   . Hyperlipidemia Other   . Obesity Other   . Stroke Other   . Sleep apnea Other   . Bipolar disorder Father   . Bipolar disorder Sister     Social History   Tobacco Use  . Smoking status: Never Smoker  . Smokeless tobacco: Never Used  Vaping Use  . Vaping Use: Never used  Substance Use Topics  . Alcohol use: No    Alcohol/week: 0.0 standard drinks  . Drug use: No    Home Medications Prior to Admission medications   Medication Sig Start Date End Date Taking? Authorizing Provider  albuterol (ACCUNEB) 1.25 MG/3ML nebulizer solution Take 1 ampule by nebulization every 6 (six) hours as needed for wheezing.    [provider]  albuterol (VENTOLIN HFA) 108 (90 Base) MCG/ACT inhaler Inhale 2 puffs into the lungs every 6 (six) hours as needed for wheezing. For wheezing 08/03/18   Wallis Bamberg, PA-C  cetirizine [facility] ALLERGY) 10  MG tablet Take 1 tablet (10 mg total) by mouth daily. 08/03/18   Wallis Bamberg, PA-C  ibuprofen (ADVIL) 800 MG tablet Take 1 tablet (800 mg total) by mouth every 8 (eight) hours as needed. 01/04/19   Brock Bad, MD  Multiple Vitamin (MULTIVITAMIN WITH MINERALS) TABS tablet Take 1 tablet by mouth daily.    [provider]  dicyclomine (BENTYL) 20 MG tablet Take 1 tablet (20 mg total) by mouth every 6 (six) hours. Patient not taking: Reported on 08/03/2018 12/14/17 08/03/18  Brock Bad, MD  medroxyPROGESTERone (PROVERA) 10 MG tablet Take 1 tablet (10 mg total) by mouth daily. Patient not taking: Reported on 04/19/2017 04/04/17 08/03/18  Brock Bad, MD  MedroxyPROGESTERone Acetate 150 MG/ML SUSY INJECT INTO THE MUSCLE EVERY 3 MONTHS Patient not  taking: Reported on 12/02/2016 09/06/16 08/03/18  Brock Bad, MD    Allergies    Cefaclor and Sulfonamide derivatives  Review of Systems   Review of Systems  Constitutional: Negative for fever.  HENT: Negative for rhinorrhea and sore throat.   Eyes: Negative for redness.  Respiratory: Negative for cough.   Cardiovascular: Negative for chest pain.  Gastrointestinal: Positive for abdominal pain, diarrhea and nausea. Negative for vomiting.  Genitourinary: Positive for pelvic pain and vaginal bleeding. Negative for dysuria, frequency, hematuria, urgency and vaginal discharge.  Musculoskeletal: Positive for back pain. Negative for myalgias.  Skin: Negative for rash.  Neurological: Negative for headaches.    Physical Exam Updated Vital Signs BP (!) 153/84 (BP Location: Left Arm)   Pulse 79   Temp 98.5 F (36.9 C) (Oral)   Resp 17   SpO2 98%   Physical Exam Vitals and nursing note reviewed. Exam conducted with a chaperone present.  Constitutional:      General: She is not in acute distress.    Appearance: She is well-developed.  HENT:     Head: Normocephalic and atraumatic.     Right Ear: External ear normal.     Left Ear: External ear normal.     Nose: Nose normal.  Eyes:     Conjunctiva/sclera: Conjunctivae normal.  Cardiovascular:     Rate and Rhythm: Normal rate and regular rhythm.     Heart sounds: No murmur heard.   Pulmonary:     Effort: No respiratory distress.     Breath sounds: No wheezing, rhonchi or rales.  Abdominal:     Palpations: Abdomen is soft.     Tenderness: There is abdominal tenderness in the right lower quadrant, suprapubic area and left lower quadrant. There is no guarding or rebound.  Genitourinary:    Labia:        Right: No lesion.        Left: No lesion.      Vagina: Bleeding (dark red blood, moderate, no clots) present.     Cervix: No cervical motion tenderness or discharge.     Uterus: Not tender.      Adnexa:        Right:  Tenderness present.        Left: No tenderness.    Musculoskeletal:     Cervical back: Normal range of motion and neck supple.     Right lower leg: No edema.     Left lower leg: No edema.  Skin:    General: Skin is warm and dry.     Findings: No rash.  Neurological:     General: No focal deficit present.  Mental Status: She is alert. Mental status is at baseline.     Motor: No weakness.  Psychiatric:        Mood and Affect: Mood normal.     ED Results / Procedures / Treatments   Labs (all labs ordered are listed, but only abnormal results are displayed) Labs Reviewed  WET PREP, GENITAL - Abnormal; Notable for the following components:      Result Value   WBC, Wet Prep HPF POC FEW (*)    All other components within normal limits  COMPREHENSIVE METABOLIC PANEL - Abnormal; Notable for the following components:   Glucose, Bld 106 (*)    ALT 61 (*)    All other components within normal limits  CBC WITH DIFFERENTIAL/PLATELET - Abnormal; Notable for the following components:   WBC 12.0 (*)    Eosinophils Absolute 1.3 (*)    Basophils Absolute 0.2 (*)    All other components within normal limits  URINALYSIS, ROUTINE W REFLEX MICROSCOPIC - Abnormal; Notable for the following components:   APPearance HAZY (*)    Hgb urine dipstick LARGE (*)    Leukocytes,Ua SMALL (*)    RBC / HPF >50 (*)    Bacteria, UA RARE (*)    All other components within normal limits  LIPASE, BLOOD  I-STAT BETA HCG BLOOD, ED ([facility], [facility], [facility] ONLY)  GC/[facility]LAM[facility]DIA PROBE AMP () [facility]    EKG None  Radiology US PELVIC COMPLETE W TRANSVAGINAL AND TORSION R/O  Result Date: 03/26/2020 CLINICAL DATA:  RIGHT pelvic pain and vaginal bleeding; prior RIGHT salpingectomy in 2016; LMP 03/24/2020 EXAM: TRANSABDOMINAL AND TRANSVAGINAL ULTRASOUND OF PELVIS DOPPLER ULTRASOUND OF OVARIES TECHNIQUE: Both transabdominal and transvaginal ultrasound examinations of the pelvis were performed. Transabdominal  technique was performed for global imaging of the pelvis including uterus, ovaries, adnexal regions, and pelvic cul-de-sac. It was necessary to proceed with endovaginal exam following the transabdominal exam to visualize the endometrium and LEFT ovary. Color and duplex Doppler ultrasound was utilized to evaluate blood flow to the ovaries. COMPARISON:  01/11/2019 FINDINGS: Uterus Measurements: 9.3 x 4.3 x 5.7 cm = volume: 86 mL. Anteverted. Normal morphology without mass Endometrium Thickness: 4 mm.  No endometrial fluid or focal abnormality Right ovary Measurements: 3.9 x 2.3 x 2.4 cm = volume: 10.9 mL. Seen only on transabdominal imaging, obscured by bowel on transvaginal imaging. Normal morphology without mass Left ovary Measurements: 3.2 x 2.3 x 2.2 cm = volume: 8.4 mL. Normal morphology without mass Pulsed Doppler evaluation of both ovaries demonstrates low resistance arterial and venous waveforms in both ovaries. Other findings No free pelvic fluid.  No adnexal masses. IMPRESSION: Normal exam. No pelvic sonographic abnormalities identified. Electronically Signed   By: Ulyses SouthwardMark  Boles M.D.   On: 03/26/2020 10:55    Procedures Procedures   Medications Ordered in ED Medications  oxyCODONE-acetaminophen (PERCOCET/ROXICET) 5-325 MG per tablet 1 tablet (has no administration in time range)  ketorolac (TORADOL) 30 MG/ML injection 30 mg (30 mg Intravenous Given 03/26/20 0810)  ondansetron (ZOFRAN) injection 4 mg (4 mg Intravenous Given 03/26/20 0809)  HYDROmorphone (DILAUDID) injection 0.5 mg (0.5 mg Intravenous Given 03/26/20 [id number])    ED Course  I have reviewed the triage vital signs and the nursing notes.  Pertinent labs & imaging results that were available during my care of the patient were reviewed by me and considered in my medical decision making (see chart for details).  Patient seen and examined. Awaiting pregnancy test. Will need pelvic  exam, labs, possible [place]. Medications ordered.   Vital signs  reviewed and are as follows: BP (!) 153/84 (BP Location: Left Arm)   Pulse 79   Temp 98.5 F (36.9 C) (Oral)   Resp 17   SpO2 98%   8:19 AM Neg pregnancy. Will set-up for pelvic exam.   8:46 AM Pelvic exam performed with RN chaperone.   11:19 AM ultrasound results reviewed, discussed with patient.  She states that he is feeling better and her pain is improved.  Will give a oral Percocet prior to discharge to keep pain controlled traveling home.    Patient also mentions an itchy rash that she has on her central chest.  She has applied hydrocortisone cream without much improvement.  Will prescribe Kenalog for this.  Does not appear cellulitic.  Encouraged follow-up with her PCP/GYN for further evaluation of her symptoms in the next week.  Patient counseled on use of narcotic pain medications. Counseled not to combine these medications with others containing tylenol. Urged not to drink alcohol, drive, or perform any other activities that requires focus while taking these medications. The patient verbalizes understanding and agrees with the plan.    MDM Rules/Calculators/A&P                          Pelvic pain/vaginal bleeding: Patient with pelvic pain.  Overall symptoms are consistent with menstrual period with pelvic pain, back pain, vaginal bleeding.  However patient was more tender on exam on the right so an ultrasound was performed.  This was negative.  Patient is not pregnant.  Vitals are stable, no fever. Labs mild elevation of white blood cell count. Imaging negative. No signs of dehydration, patient is tolerating PO's. Lungs are clear and no signs suggestive of PNA. Low concern for appendicitis, cholecystitis, pancreatitis, ruptured viscus, UTI, kidney stone, aortic dissection, aortic aneurysm or other emergent abdominal etiology. Supportive therapy indicated with return if symptoms worsen.     Final Clinical Impression(s) / ED Diagnoses Final diagnoses:  Pelvic pain  Vaginal  bleeding    Rx / DC Orders ED Discharge Orders         Ordered    naproxen (NAPROSYN) 500 MG tablet  2 times daily        03/26/20 1118    HYDROcodone-acetaminophen (NORCO/VICODIN) 5-325 MG tablet  Every 6 hours PRN        03/26/20 1118    triamcinolone (KENALOG) 0.1 %  2 times daily        03/26/20 1118           Renne Crigler, PA-C 03/26/20 1121    Sabino Donovan, MD 03/27/20 [phone]

## 2020-03-26 NOTE — ED Triage Notes (Signed)
Pt here with c/o lower abd pain and vaginal bleeding , pt has known cyst and has a [place] scheduled on march 2nd , also a chance that pt may be pregnant

## 2020-03-27 LAB — GC/CHLAMYDIA PROBE AMP (~~LOC~~) NOT AT ARMC
Chlamydia: NEGATIVE
Comment: NEGATIVE
Comment: NORMAL
Neisseria Gonorrhea: NEGATIVE

## 2020-08-06 ENCOUNTER — Ambulatory Visit: Payer: Self-pay

## 2021-09-08 ENCOUNTER — Encounter (INDEPENDENT_AMBULATORY_CARE_PROVIDER_SITE_OTHER): Payer: Self-pay

## 2021-12-11 ENCOUNTER — Other Ambulatory Visit (HOSPITAL_COMMUNITY): Payer: Medicaid Other

## 2021-12-11 ENCOUNTER — Emergency Department (HOSPITAL_COMMUNITY): Payer: Medicaid Other

## 2021-12-11 ENCOUNTER — Other Ambulatory Visit: Payer: Self-pay

## 2021-12-11 ENCOUNTER — Encounter (HOSPITAL_COMMUNITY): Payer: Self-pay

## 2021-12-11 ENCOUNTER — Emergency Department (HOSPITAL_COMMUNITY)
Admission: EM | Admit: 2021-12-11 | Discharge: 2021-12-11 | Disposition: A | Discharge status: Home / Self Care | Payer: Medicaid Other | Attending: Emergency Medicine | Admitting: Emergency Medicine

## 2021-12-11 DIAGNOSIS — S0990XA Unspecified injury of head, initial encounter: Secondary | ICD-10-CM | POA: Diagnosis present

## 2021-12-11 DIAGNOSIS — S060XAA Concussion with loss of consciousness status unknown, initial encounter: Secondary | ICD-10-CM | POA: Diagnosis not present

## 2021-12-11 DIAGNOSIS — S0081XA Abrasion of other part of head, initial encounter: Secondary | ICD-10-CM | POA: Diagnosis not present

## 2021-12-11 DIAGNOSIS — J45909 Unspecified asthma, uncomplicated: Secondary | ICD-10-CM | POA: Diagnosis not present

## 2021-12-11 DIAGNOSIS — M25562 Pain in left knee: Secondary | ICD-10-CM | POA: Diagnosis not present

## 2021-12-11 DIAGNOSIS — R109 Unspecified abdominal pain: Secondary | ICD-10-CM | POA: Insufficient documentation

## 2021-12-11 DIAGNOSIS — Y92410 Unspecified street and highway as the place of occurrence of the external cause: Secondary | ICD-10-CM | POA: Insufficient documentation

## 2021-12-11 DIAGNOSIS — M25462 Effusion, left knee: Secondary | ICD-10-CM | POA: Diagnosis not present

## 2021-12-11 DIAGNOSIS — M542 Cervicalgia: Secondary | ICD-10-CM | POA: Diagnosis not present

## 2021-12-11 MED ORDER — HYDROCODONE-ACETAMINOPHEN 5-325 MG PO TABS: 1.0000 | ORAL_TABLET | Freq: Four times a day (QID) | ORAL | 0 refills | Status: AC | PRN

## 2021-12-11 MED ORDER — METHOCARBAMOL 500 MG PO TABS: 500.0000 mg | ORAL_TABLET | Freq: Two times a day (BID) | ORAL | 0 refills | Status: AC

## 2021-12-11 MED ORDER — HYDROCODONE-ACETAMINOPHEN 5-325 MG PO TABS
1.0000 | ORAL_TABLET | Freq: Once | ORAL | Status: AC
  Administered 2021-12-11: 1 via ORAL
  Filled 2021-12-11: qty 1

## 2021-12-11 MED ORDER — ONDANSETRON HCL 4 MG PO TABS: 4.0000 mg | ORAL_TABLET | Freq: Three times a day (TID) | ORAL | 0 refills | Status: AC | PRN

## 2021-12-11 MED ORDER — ONDANSETRON 8 MG PO TBDP
8.0000 mg | ORAL_TABLET | Freq: Once | ORAL | Status: AC
  Administered 2021-12-11: 8 mg via ORAL
  Filled 2021-12-11: qty 1
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
## 2023-06-05 ENCOUNTER — Emergency Department (HOSPITAL_BASED_OUTPATIENT_CLINIC_OR_DEPARTMENT_OTHER): Admission: EM | Admit: 2023-06-05 | Discharge: 2023-06-05 | Disposition: A | Discharge status: Home / Self Care

## 2023-06-05 ENCOUNTER — Other Ambulatory Visit: Payer: Self-pay

## 2023-06-05 ENCOUNTER — Encounter (HOSPITAL_BASED_OUTPATIENT_CLINIC_OR_DEPARTMENT_OTHER): Payer: Self-pay

## 2023-06-05 ENCOUNTER — Emergency Department (HOSPITAL_BASED_OUTPATIENT_CLINIC_OR_DEPARTMENT_OTHER)

## 2023-06-05 DIAGNOSIS — M5481 Occipital neuralgia: Secondary | ICD-10-CM

## 2023-06-05 DIAGNOSIS — R519 Headache, unspecified: Secondary | ICD-10-CM | POA: Diagnosis present

## 2023-06-05 DIAGNOSIS — R6889 Other general symptoms and signs: Secondary | ICD-10-CM

## 2023-06-05 LAB — BASIC METABOLIC PANEL WITH GFR
Anion gap: 10 (ref 5–15)
BUN: 10 mg/dL (ref 6–20)
CO2: 26 mmol/L (ref 22–32)
Calcium: 9.7 mg/dL (ref 8.9–10.3)
Chloride: 104 mmol/L (ref 98–111)
Creatinine, Ser: 0.67 mg/dL (ref 0.44–1.00)
GFR, Estimated: >60 mL/min (ref >60)
Glucose, Bld: 94 mg/dL (ref 70–99)
Potassium: 4.4 mmol/L (ref 3.5–5.1)
Sodium: 140 mmol/L (ref 135–145)

## 2023-06-05 LAB — CBC WITH DIFFERENTIAL/PLATELET
Abs Immature Granulocytes: 0.04 10*3/uL (ref 0.00–0.07)
Basophils Absolute: 0.2 10*3/uL — ABNORMAL HIGH (ref 0.0–0.1)
Basophils Relative: 2 %
Eosinophils Absolute: 1 10*3/uL — ABNORMAL HIGH (ref 0.0–0.5)
Eosinophils Relative: 10 %
HCT: 42.9 % (ref 36.0–46.0)
Hemoglobin: 14 g/dL (ref 12.0–15.0)
Immature Granulocytes: 0 %
Lymphocytes Relative: 27 %
Lymphs Abs: 2.7 10*3/uL (ref 0.7–4.0)
MCH: 29.5 pg (ref 26.0–34.0)
MCHC: 32.6 g/dL (ref 30.0–36.0)
MCV: 90.3 fL (ref 80.0–100.0)
Monocytes Absolute: 0.7 10*3/uL (ref 0.1–1.0)
Monocytes Relative: 7 %
Neutro Abs: 5.5 10*3/uL (ref 1.7–7.7)
Neutrophils Relative %: 54 %
Platelets: 343 10*3/uL (ref 150–400)
RBC: 4.75 MIL/uL (ref 3.87–5.11)
RDW: 12.2 % (ref 11.5–15.5)
WBC: 10.1 10*3/uL (ref 4.0–10.5)
nRBC: 0 % (ref 0.0–0.2)

## 2023-06-05 LAB — HCG, SERUM, QUALITATIVE: Preg, Serum: NEGATIVE

## 2023-06-05 MED ORDER — METOCLOPRAMIDE HCL 5 MG/ML IJ SOLN
5.0000 mg | Freq: Once | INTRAMUSCULAR | Status: AC
  Administered 2023-06-05: 5 mg via INTRAVENOUS
  Filled 2023-06-05: qty 2

## 2023-06-05 MED ORDER — NAPROXEN 375 MG PO TABS: 375.0000 mg | ORAL_TABLET | Freq: Two times a day (BID) | ORAL | 0 refills | Status: AC

## 2023-06-05 MED ORDER — IOHEXOL 300 MG/ML  SOLN
100.0000 mL | Freq: Once | INTRAMUSCULAR | Status: AC | PRN
  Administered 2023-06-05: 75 mL via INTRAVENOUS

## 2023-06-05 MED ORDER — HYDROCODONE-ACETAMINOPHEN 5-325 MG PO TABS: 1.0000 | ORAL_TABLET | Freq: Four times a day (QID) | ORAL | 0 refills | Status: AC | PRN

## 2023-06-05 MED ORDER — KETOROLAC TROMETHAMINE 30 MG/ML IJ SOLN
30.0000 mg | Freq: Once | INTRAMUSCULAR | Status: AC
  Administered 2023-06-05: 30 mg via INTRAVENOUS
  Filled 2023-06-05: qty 1

## 2023-06-05 MED ORDER — DOXYCYCLINE HYCLATE 100 MG PO CAPS: 100.0000 mg | ORAL_CAPSULE | Freq: Two times a day (BID) | ORAL | 0 refills | Status: DC

## 2023-06-05 MED ORDER — DEXAMETHASONE SODIUM PHOSPHATE 10 MG/ML IJ SOLN
10.0000 mg | Freq: Once | INTRAMUSCULAR | Status: AC
  Administered 2023-06-05: 10 mg via INTRAVENOUS
  Filled 2023-06-05: qty 1

## 2023-06-05 NOTE — ED Provider Notes (Signed)
 [place] EMERGENCY DEPARTMENT AT [facility] Provider Note   CSN: [id number] Arrival date & time: 06/05/23  4098     History  Chief Complaint  Patient presents with   Headache    Kaitlyn Walton is a 38 y.o. female with past medical history of PCOS who presents emergency department chief complaint of headache.  She rates her headache as moderate to severe.  He does not have a history of headaches.  She has associated nausea without neck stiffness, vision change, photophobia, phonophobia.  Patient has sensation of constant dull but moderate to severe aching with palpable swelling and tenderness over the right occiput.  She has episodes of shooting, sharp pain in the distribution of the greater and lesser occipital nerves.  She has never had anything like this before.  She tried Motrin  with only minimal relief.  She iced the back of her occiput which did seem to improve her pain a bit more significantly.  She did   Headache      Home Medications Prior to Admission medications   Medication Sig Start Date End Date Taking? Authorizing Provider  albuterol  (ACCUNEB ) 1.25 MG/3ML nebulizer solution Take 1 ampule by nebulization every 6 (six) hours as needed for wheezing.    [provider]  albuterol  (VENTOLIN  HFA) 108 (90 Base) MCG/ACT inhaler Inhale 2 puffs into the lungs every 6 (six) hours as needed for wheezing. For wheezing 08/03/18   Adolph Hoop, PA-C  cetirizine  (ZYRTEC  ALLERGY) 10 MG tablet Take 1 tablet (10 mg total) by mouth daily. 08/03/18   Adolph Hoop, PA-C  HYDROcodone -acetaminophen  (NORCO/VICODIN) 5-325 MG tablet Take 1 tablet by mouth every 6 (six) hours as needed for severe pain. 03/26/20   Geiple, Joshua, PA-C  HYDROcodone -acetaminophen  (NORCO/VICODIN) 5-325 MG tablet Take 1 tablet by mouth every 6 (six) hours as needed. 12/11/21   Elisa Guest, PA-C  ibuprofen  (ADVIL ) 800 MG tablet Take 1 tablet (800 mg total) by mouth every 8 (eight) hours as  needed. 01/04/19   Gabrielle Joiner, MD  methocarbamol  (ROBAXIN ) 500 MG tablet Take 1 tablet (500 mg total) by mouth 2 (two) times daily. 12/11/21   Elisa Guest, PA-C  Multiple Vitamin (MULTIVITAMIN WITH MINERALS) TABS tablet Take 1 tablet by mouth daily.    [provider]  naproxen  (NAPROSYN ) 500 MG tablet Take 1 tablet (500 mg total) by mouth 2 (two) times daily. 03/26/20   Geiple, Joshua, PA-C  ondansetron  (ZOFRAN ) 4 MG tablet Take 1 tablet (4 mg total) by mouth every 8 (eight) hours as needed for nausea or vomiting. 12/11/21   Elisa Guest, PA-C  triamcinolone  (KENALOG ) 0.1 % Apply 1 application topically 2 (two) times daily. 03/26/20   Geiple, Joshua, PA-C  dicyclomine  (BENTYL ) 20 MG tablet Take 1 tablet (20 mg total) by mouth every 6 (six) hours. Patient not taking: Reported on 08/03/2018 12/14/17 08/03/18  Gabrielle Joiner, MD  medroxyPROGESTERone  (PROVERA ) 10 MG tablet Take 1 tablet (10 mg total) by mouth daily. Patient not taking: Reported on 04/19/2017 04/04/17 08/03/18  Gabrielle Joiner, MD  MedroxyPROGESTERone  Acetate 150 MG/ML SUSY INJECT 1ML INTO THE MUSCLE EVERY 3 MONTHS Patient not taking: Reported on 12/02/2016 09/06/16 08/03/18  Gabrielle Joiner, MD      Allergies    Cefaclor and Sulfonamide derivatives    Review of Systems   Review of Systems  Neurological:  Positive for headaches.    Physical Exam Updated Vital Signs BP 128/87 (BP Location: Right Arm)  Pulse 71   Temp 98.5 F (36.9 C) (Oral)   Resp 18   Ht 5\' 4"  (1.626 m)   Wt 90.7 kg   LMP 05/17/2023 (Exact Date)   SpO2 99%   BMI 34.33 kg/m  Physical Exam Physical Exam  Constitutional: Pt is oriented to person, place, and time. Pt appears well-developed and well-nourished. No distress.  HENT:  Head: Normocephalic and atraumatic.  Tenderness to the right occiput with subtle but palpable swelling just to the superior nuchal ridge with exquisite tenderness, no erythema, no rashes. Mouth/Throat:  Oropharynx is clear and moist.  Eyes: Conjunctivae and EOM are normal. Pupils are equal, round, and reactive to light. No scleral icterus.  No horizontal, vertical or rotational nystagmus  Neck: Normal range of motion. Neck supple.  Full active and passive ROM without pain No midline or paraspinal tenderness No nuchal rigidity or meningeal signs  Cardiovascular: Normal rate, regular rhythm and intact distal pulses.   Pulmonary/Chest: Effort normal and breath sounds normal. No respiratory distress. Pt has no wheezes. No rales.  Abdominal: Soft. Bowel sounds are normal. There is no tenderness. There is no rebound and no guarding.  Musculoskeletal: Normal range of motion.  Lymphadenopathy:    No cervical adenopathy.  Neurological: Pt. is alert and oriented to person, place, and time. He has normal reflexes. No cranial nerve deficit.  Exhibits normal muscle tone. Coordination normal.  Speech is clear and goal oriented, follows commands Major Cranial nerves without deficit, no facial droop Normal strength in upper and lower extremities bilaterally including dorsiflexion and plantar flexion, strong and equal grip strength Sensation normal to light and sharp touch Moves extremities without ataxia, coordination intact Normal finger to nose and rapid alternating movements Neg pronator drift Normal gait Skin: Skin is warm and dry. No rash noted. Pt is not diaphoretic.  Psychiatric: Pt has a normal mood and affect. Behavior is normal. Judgment and thought content normal.  Nursing note and vitals reviewed.  ED Results / Procedures / Treatments   Labs (all labs ordered are listed, but only abnormal results are displayed) Labs Reviewed  CBC WITH DIFFERENTIAL/PLATELET - Abnormal; Notable for the following components:      Result Value   Eosinophils Absolute 1.0 (*)    Basophils Absolute 0.2 (*)    All other components within normal limits  BASIC METABOLIC PANEL WITH GFR  HCG, SERUM, QUALITATIVE     EKG None  Radiology CT Head W or Wo Contrast Result Date: 06/05/2023 CLINICAL DATA:  Provided history: Headache, new onset. Right occipital pain and swelling. EXAM: CT HEAD WITHOUT AND WITH CONTRAST TECHNIQUE: Contiguous axial images were obtained from the base of the skull through the vertex without and with intravenous contrast. RADIATION DOSE REDUCTION: This exam was performed according to the departmental dose-optimization program which includes automated exposure control, adjustment of the mA and/or kV according to patient size and/or use of iterative reconstruction technique. CONTRAST:  75mL OMNIPAQUE  IOHEXOL  300 MG/ML  SOLN COMPARISON:  Head CT 12/11/2021. FINDINGS: Brain: Cerebral volume is normal. There is no acute intracranial hemorrhage. No demarcated cortical infarct. No extra-axial fluid collection. No evidence of an intracranial mass. No midline shift. No pathologic intracranial enhancement identified. Vascular: No hyperdense vessel on pre-contrast imaging. Enhancement is of the dural venous sinuses on post-contrast imaging. Skull: No calvarial fracture or aggressive osseous lesion. Sinuses/Orbits: No mass or acute finding within the imaged orbits. Trace mucosal thickening within the right frontal and bilateral ethmoid sinuses. Other: Asymmetric stranding within the  right occipital scalp (for instance as seen on series 6, image 21). IMPRESSION: 1. Unremarkable CT appearance of the brain with and without contrast. No evidence of an acute intracranial abnormality. 2. Asymmetric stranding within the right occipital scalp, nonspecific but potentially infectious/inflammatory. Correlate with the physical exam findings. Electronically Signed   By: Bascom Lily D.O.   On: 06/05/2023 11:22    Procedures Procedures    Medications Ordered in ED Medications  ketorolac  (TORADOL ) 30 MG/ML injection 30 mg (30 mg Intravenous Given 06/05/23 1026)  dexamethasone  (DECADRON ) injection 10 mg (10 mg  Intravenous Given 06/05/23 1025)  metoCLOPramide  (REGLAN ) injection 5 mg (5 mg Intravenous Given 06/05/23 1022)  iohexol  (OMNIPAQUE ) 300 MG/ML solution 100 mL (75 mLs Intravenous Contrast Given 06/05/23 1058)    ED Course/ Medical Decision Making/ A&P Clinical Course as of 06/05/23 1153  Mon Jun 05, 2023  1040 Basic metabolic panel [AH]  1040 hCG, serum, qualitative [AH]  1040 CBC with Differential(!) [AH]  1135 CT Head W or Wo Contrast [AH]    Clinical Course User Index [AH] Tama Fails, PA-C                                 Medical Decision Making Amount and/or Complexity of Data Reviewed Labs: ordered. Decision-making details documented in ED Course. Radiology: ordered. Decision-making details documented in ED Course.  Risk Prescription drug management.   This patient presents to the ED with chief complaint(s) of headache with pertinent past medical history of PCOS which further complicates the presenting complaint. The complaint involves an extensive differential diagnosis and treatment options and also carries with it a high risk of complications and morbidity.    The differential diagnosis includes herpes zoster, occipital neuralgia, abscess, lipoma, less likely tumor, meningitis, or idiopathic intracranial hypertension.  The initial plan is to order basic labs, CT head with and without contrast for evaluation of soft tissue and to treat patient with Toradol  Decadron  and Reglan  for headache and occipital pain  Additional Tests and treatment considered: Patient is low risk / negative for meningitis, therefore do not feel that LP is indicated.  Additional history obtained: Additional history obtained from family   Reassessment and review (also see workup area): Lab Tests: I Ordered, and personally interpreted labs.  The pertinent results include:     No sig lab findings  Imaging Studies: I ordered and independently visualized and interpreted the following imaging CT  scan head w/wo   which showed asymmetric soft tissue stranding of the right occiput The interpretation of the imaging was limited to assessing for emergent pathology, for which purpose it was ordered.   After evaluating all of the data points in this case, it appears that the patient has a localized infection to the occipital region causing headache.  This is also likely irritating and or applying compression to the occipital nerves giving her the shooting pain.  There is no evidence of herpes zoster.  The presentation NOT consistent with skull fracture, meningitis/encephalitis, SAH/sentinel bleed, Intracranial Hemorrhage (ICH) (subdural/epidural), acute obstructive hydrocephalus, space occupying lesions, CVA, CO Poisoning, Basilar/vertebral artery dissection, preeclampsia, cerebral venous thrombosis, hypertensive emergency, temporal Arteritis, Idiopathic Intracranial Hypertension (pseudotumor cerebri).  Plan is to treat the patient with anti-inflammatories, antibiotics and small dose of stronger narcotic pain medicine for the severe pain patient has been experiencing. Strict return and follow-up precautions have been given by me personally or by detailed written instructions verbalized by  nursing staff using the teach back method to patient/family/caregiver.  Data Reviewed/Counseling: I have reviewed the patient's vital signs, nursing notes, and other relevant tests/information. I had a detailed discussion regarding the historical points, exam findings, and any diagnostic results supporting the discharge diagnosis. I also discussed the need for outpatient follow-up and the need to return to the ED if symptoms worsen or if there are any questions or concerns that arise at hom        Final Clinical Impression(s) / ED Diagnoses Final diagnoses:  Suspected soft tissue infection  Bad headache  Occipital neuralgia of right side    Rx / DC Orders ED Discharge Orders     None          Tama Fails, PA-C 06/05/23 1154    Rolinda Climes, DO 06/05/23 1549

## 2023-06-05 NOTE — Discharge Instructions (Addendum)
Contact a health care provider if: You have a fever. Your symptoms do not improve within 1-2 days of starting treatment or you develop new symptoms. Your bone or joint underneath the infected area becomes painful after the skin has healed. Your infection returns in the same area or another area. Signs of this may include: You notice a swollen bump in the infected area. Your red area gets larger, turns dark in color, or becomes more painful. Drainage increases. Pus or a bad smell develops in your infected area. You have more pain. You feel ill and have muscle aches and weakness. You develop vomiting or diarrhea that will not go away. Get help right away if: You notice red streaks coming from the infected area. You notice the skin turns purple or black and falls off. This symptom may be an emergency. Get help right away. Call 911. Do not wait to see if the symptom will go away. Do not drive yourself to the hospital.

## 2023-06-05 NOTE — ED Notes (Signed)
 Pt to CT

## 2023-06-05 NOTE — ED Triage Notes (Signed)
 Pt POV from home c/o headache x 4 days. Pt also c/o nausea, denies vomiting

## 2023-11-14 ENCOUNTER — Ambulatory Visit

## 2023-11-14 DIAGNOSIS — Z113 Encounter for screening for infections with a predominantly sexual mode of transmission: Secondary | ICD-10-CM | POA: Diagnosis not present

## 2023-11-14 DIAGNOSIS — R21 Rash and other nonspecific skin eruption: Secondary | ICD-10-CM

## 2023-11-14 DIAGNOSIS — Z202 Contact with and (suspected) exposure to infections with a predominantly sexual mode of transmission: Secondary | ICD-10-CM

## 2023-11-14 LAB — HM HEPATITIS C SCREENING LAB: HM Hepatitis Screen: NEGATIVE

## 2023-11-14 LAB — HM HIV SCREENING LAB: HM HIV Screening: NEGATIVE

## 2023-11-14 LAB — WET PREP FOR TRICH, YEAST, CLUE
Clue Cell Exam: NEGATIVE
Trichomonas Exam: NEGATIVE
Yeast Exam: NEGATIVE

## 2023-11-14 LAB — HEPATITIS B SURFACE ANTIGEN

## 2023-11-14 MED ORDER — METRONIDAZOLE 500 MG PO TABS: 500.0000 mg | ORAL_TABLET | Freq: Two times a day (BID) | ORAL | Status: AC

## 2023-11-14 MED ORDER — CLOTRIMAZOLE-BETAMETHASONE 1-0.05 % EX CREA: 1.0000 | TOPICAL_CREAM | Freq: Every day | CUTANEOUS | Status: AC

## 2023-11-14 NOTE — Progress Notes (Signed)
 Pt here for STI screening and for treatment as contact to [facility]  Wet mount reviewed with patient.  Metronidazole  500mg  #14 and Lotrimin Cream 1 tube dispensed to patient.  Counseled re medications, side effects, plan of care and when to contact clinic with questions or concerns.  Verbalizes understanding.  Condoms declined.-Matilde Markie, RN
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
## 2023-11-21 LAB — GONOCOCCUS CULTURE

## 2024-02-07 ENCOUNTER — Encounter (HOSPITAL_BASED_OUTPATIENT_CLINIC_OR_DEPARTMENT_OTHER): Payer: Self-pay

## 2024-02-07 ENCOUNTER — Emergency Department (HOSPITAL_BASED_OUTPATIENT_CLINIC_OR_DEPARTMENT_OTHER)
Admission: EM | Admit: 2024-02-07 | Discharge: 2024-02-07 | Disposition: A | Discharge status: Home / Self Care | Attending: Emergency Medicine | Admitting: Emergency Medicine

## 2024-02-07 ENCOUNTER — Other Ambulatory Visit: Payer: Self-pay

## 2024-02-07 ENCOUNTER — Emergency Department (HOSPITAL_BASED_OUTPATIENT_CLINIC_OR_DEPARTMENT_OTHER): Admitting: Radiology

## 2024-02-07 DIAGNOSIS — J101 Influenza due to other identified influenza virus with other respiratory manifestations: Secondary | ICD-10-CM | POA: Insufficient documentation

## 2024-02-07 DIAGNOSIS — J111 Influenza due to unidentified influenza virus with other respiratory manifestations: Secondary | ICD-10-CM

## 2024-02-07 DIAGNOSIS — R059 Cough, unspecified: Secondary | ICD-10-CM | POA: Diagnosis present

## 2024-02-07 DIAGNOSIS — J45909 Unspecified asthma, uncomplicated: Secondary | ICD-10-CM | POA: Insufficient documentation

## 2024-02-07 LAB — CBC WITH DIFFERENTIAL/PLATELET
Abs Immature Granulocytes: 0.04 K/uL (ref 0.00–0.07)
Basophils Absolute: 0.1 K/uL (ref 0.0–0.1)
Basophils Relative: 1 %
Eosinophils Absolute: 0.1 K/uL (ref 0.0–0.5)
Eosinophils Relative: 2 %
HCT: 37.6 % (ref 36.0–46.0)
Hemoglobin: 12.7 g/dL (ref 12.0–15.0)
Immature Granulocytes: 1 %
Lymphocytes Relative: 5 %
Lymphs Abs: 0.4 K/uL — ABNORMAL LOW (ref 0.7–4.0)
MCH: 29.1 pg (ref 26.0–34.0)
MCHC: 33.8 g/dL (ref 30.0–36.0)
MCV: 86 fL (ref 80.0–100.0)
Monocytes Absolute: 0.9 K/uL (ref 0.1–1.0)
Monocytes Relative: 11 %
Neutro Abs: 6.9 K/uL (ref 1.7–7.7)
Neutrophils Relative %: 80 %
Platelets: 321 K/uL (ref 150–400)
RBC: 4.37 MIL/uL (ref 3.87–5.11)
RDW: 12.3 % (ref 11.5–15.5)
WBC: 8.4 K/uL (ref 4.0–10.5)
nRBC: 0 % (ref 0.0–0.2)

## 2024-02-07 LAB — RESP PANEL BY RT-PCR (RSV, FLU A&B, COVID)  RVPGX2
Influenza A by PCR: POSITIVE — AB
Influenza B by PCR: NEGATIVE
Resp Syncytial Virus by PCR: NEGATIVE
SARS Coronavirus 2 by RT PCR: NEGATIVE

## 2024-02-07 LAB — COMPREHENSIVE METABOLIC PANEL WITH GFR
ALT: 15 U/L (ref 0–44)
AST: 23 U/L (ref 15–41)
Albumin: 4.4 g/dL (ref 3.5–5.0)
Alkaline Phosphatase: 66 U/L (ref 38–126)
Anion gap: 16 — ABNORMAL HIGH (ref 5–15)
BUN: 10 mg/dL (ref 6–20)
CO2: 20 mmol/L — ABNORMAL LOW (ref 22–32)
Calcium: 9.8 mg/dL (ref 8.9–10.3)
Chloride: 105 mmol/L (ref 98–111)
Creatinine, Ser: 0.7 mg/dL (ref 0.44–1.00)
GFR, Estimated: >60 mL/min
Glucose, Bld: 92 mg/dL (ref 70–99)
Potassium: 3.6 mmol/L (ref 3.5–5.1)
Sodium: 141 mmol/L (ref 135–145)
Total Bilirubin: 0.4 mg/dL (ref 0.0–1.2)
Total Protein: 7.5 g/dL (ref 6.5–8.1)

## 2024-02-07 MED ORDER — ALBUTEROL SULFATE 1.25 MG/3ML IN NEBU: 1.0000 | INHALATION_SOLUTION | Freq: Four times a day (QID) | RESPIRATORY_TRACT | 5 refills | Status: AC | PRN

## 2024-02-07 MED ORDER — METHYLPREDNISOLONE SODIUM SUCC 125 MG IJ SOLR
125.0000 mg | Freq: Once | INTRAMUSCULAR | Status: AC
  Administered 2024-02-07: 125 mg via INTRAVENOUS
  Filled 2024-02-07: qty 2

## 2024-02-07 MED ORDER — IPRATROPIUM-ALBUTEROL 0.5-2.5 (3) MG/3ML IN SOLN
3.0000 mL | Freq: Once | RESPIRATORY_TRACT | Status: AC
  Administered 2024-02-07: 3 mL via RESPIRATORY_TRACT
  Filled 2024-02-07: qty 3

## 2024-02-07 MED ORDER — ALBUTEROL SULFATE HFA 108 (90 BASE) MCG/ACT IN AERS: 2.0000 | INHALATION_SPRAY | Freq: Four times a day (QID) | RESPIRATORY_TRACT | 0 refills | Status: AC | PRN

## 2024-02-07 MED ORDER — ALBUTEROL SULFATE HFA 108 (90 BASE) MCG/ACT IN AERS
2.0000 | INHALATION_SPRAY | Freq: Once | RESPIRATORY_TRACT | Status: AC
  Administered 2024-02-07: 2 via RESPIRATORY_TRACT
  Filled 2024-02-07: qty 6.7

## 2024-02-07 MED ORDER — OSELTAMIVIR PHOSPHATE 75 MG PO CAPS: 75.0000 mg | ORAL_CAPSULE | Freq: Two times a day (BID) | ORAL | 0 refills | Status: AC

## 2024-02-07 MED ORDER — IPRATROPIUM-ALBUTEROL 0.5-2.5 (3) MG/3ML IN SOLN
3.0000 mL | RESPIRATORY_TRACT | Status: DC
  Administered 2024-02-07: 3 mL via RESPIRATORY_TRACT
  Filled 2024-02-07: qty 3

## 2024-02-07 NOTE — ED Notes (Signed)
 DC paperwork given and verbally understood.

## 2024-02-07 NOTE — ED Provider Triage Note (Signed)
 Emergency Medicine Provider Triage Evaluation Note  Kaitlyn Walton , a 39 y.o. female  was evaluated in triage.  Pt complains of shortness of breath and chest pain x 1 day.  Patient states that she has been experiencing flulike symptoms for the last 3 days-patient is flu a positive.  Patient states that today she has been having increasing chest pain and shortness of breath.  Patient states that she is an asthmatic and has been trying her at home albuterol  treatments without relief.   Review of Systems  Positive: SOB, CP Negative:   Physical Exam  BP 100/71 (BP Location: Left Arm)   Pulse 98   Temp 99.3 F (37.4 C) (Oral)   Resp (!) 21   Ht 5' 4 (1.626 m)   Wt 99.8 kg   LMP 02/07/2024 (Exact Date)   SpO2 99%   BMI 37.76 kg/m  Gen:   Awake, no distress   Resp:  Normal effort  MSK:   Moves extremities without difficulty  Other:    Medical Decision Making  Medically screening exam initiated at 5:16 PM.  Appropriate orders placed.  Allyson Tineo Madore was informed that the remainder of the evaluation will be completed by another provider, this initial triage assessment does not replace that evaluation, and the importance of remaining in the ED until their evaluation is complete.    Willma Duwaine CROME, [place] 02/07/24 [phone]

## 2024-02-07 NOTE — Discharge Instructions (Addendum)
 Evaluate today revealed that you have the flu and likely associated mild asthma attack.  Treatment at home is mostly supportive which includes rest hydration, Tylenol  and ibuprofen  as needed and we will also send Tamiflu  to your pharmacy.  Please follow-up with your PCP.

## 2024-02-07 NOTE — ED Provider Notes (Signed)
 " Kaitlyn Walton EMERGENCY DEPARTMENT AT [facility] Provider Note   CSN: [id number] Arrival date & time: 02/07/24  1535     Patient presents with: Shortness of Breath (Pt has been experiencing flu-like symptoms and SOB for 3 days. Reports chest tightness, productive cough, intermittent congestion, and nausea. Hx of asthma; did several breathing tx's today and use of inhalers with no relief. )  HPI Kaitlyn Walton is a 39 y.o. female with asthma presenting for cough congestion and nausea.  Has been going on for 3 days. She also reports intermittent shortness of breath and chest tightness.  Also reports a fever at home.  Chest tightness is all over the chest and worse with coughing.  Seems to be worse at night as well.  At this time she states as long she is not coughing she does not have chest pain but is still somewhat short of breath.  Denies lower extremity swelling or calf tenderness.    Shortness of Breath      Prior to Admission medications  Medication Sig Start Date End Date Taking? Authorizing Provider  oseltamivir  (TAMIFLU ) 75 MG capsule Take 1 capsule (75 mg total) by mouth every 12 (twelve) hours. 02/07/24  Yes Lang Norleen POUR, PA-C  albuterol  (ACCUNEB ) 1.25 MG/3ML nebulizer solution Take 3 mLs (1.25 mg total) by nebulization every 6 (six) hours as needed for wheezing. 02/07/24   Greycen Felter K, PA-C  albuterol  (VENTOLIN  HFA) 108 (90 Base) MCG/ACT inhaler Inhale 2 puffs into the lungs every 6 (six) hours as needed for wheezing. For wheezing 02/07/24   Lang Norleen POUR, PA-C  cetirizine  (ZYRTEC  ALLERGY) 10 MG tablet Take 1 tablet (10 mg total) by mouth daily. 08/03/18   Christopher Savannah, PA-C  clotrimazole -betamethasone  (LOTRISONE ) cream Apply 1 Application topically daily. 11/14/23   Rosabel Damien BRAVO, NP  HYDROcodone -acetaminophen  (NORCO/VICODIN) 5-325 MG tablet Take 1 tablet by mouth every 6 (six) hours as needed for severe pain. 03/26/20   Geiple, Joshua, PA-C   HYDROcodone -acetaminophen  (NORCO/VICODIN) 5-325 MG tablet Take 1 tablet by mouth every 6 (six) hours as needed. 12/11/21   Logan Ubaldo NOVAK, PA-C  HYDROcodone -acetaminophen  (NORCO/VICODIN) 5-325 MG tablet Take 1-2 tablets by mouth every 6 (six) hours as needed. 06/05/23   Harris, Abigail, PA-C  ibuprofen  (ADVIL ) 800 MG tablet Take 1 tablet (800 mg total) by mouth every 8 (eight) hours as needed. 01/04/19   Rudy Carlin LABOR, MD  methocarbamol  (ROBAXIN ) 500 MG tablet Take 1 tablet (500 mg total) by mouth 2 (two) times daily. 12/11/21   Logan Ubaldo NOVAK, PA-C  Multiple Vitamin (MULTIVITAMIN WITH MINERALS) TABS tablet Take 1 tablet by mouth daily.    [provider]  naproxen  (NAPROSYN ) 375 MG tablet Take 1 tablet (375 mg total) by mouth 2 (two) times daily with a meal. 06/05/23   Harris, Abigail, PA-C  ondansetron  (ZOFRAN ) 4 MG tablet Take 1 tablet (4 mg total) by mouth every 8 (eight) hours as needed for nausea or vomiting. 12/11/21   Logan Ubaldo NOVAK, PA-C  triamcinolone  (KENALOG ) 0.1 % Apply 1 application topically 2 (two) times daily. 03/26/20   Geiple, Joshua, PA-C  dicyclomine  (BENTYL ) 20 MG tablet Take 1 tablet (20 mg total) by mouth every 6 (six) hours. Patient not taking: Reported on 08/03/2018 12/14/17 08/03/18  Rudy Carlin LABOR, MD  medroxyPROGESTERone  (PROVERA ) 10 MG tablet Take 1 tablet (10 mg total) by mouth daily. Patient not taking: Reported on 04/19/2017 04/04/17 08/03/18  Rudy Carlin LABOR, MD  MedroxyPROGESTERone  Acetate 150  MG/ML SUSY INJECT INTO THE MUSCLE EVERY 3 MONTHS Patient not taking: Reported on 12/02/2016 09/06/16 08/03/18  Rudy Carlin LABOR, MD    Allergies: Cefaclor and Sulfonamide derivatives    Review of Systems  Respiratory:  Positive for shortness of breath.     Updated Vital Signs BP 128/67   Pulse (!) 117   Temp 99.3 F (37.4 C) (Oral)   Resp 16   Ht 5' 4 (1.626 m)   Wt 99.8 kg   LMP 02/07/2024 (Exact Date)   SpO2 100%   BMI 37.76 kg/m   Physical  Exam Vitals and nursing note reviewed.  HENT:     Head: Normocephalic and atraumatic.     Mouth/Throat:     Mouth: Mucous membranes are moist.  Eyes:     General:        Right eye: No discharge.        Left eye: No discharge.     Conjunctiva/sclera: Conjunctivae normal.  Cardiovascular:     Rate and Rhythm: Normal rate and regular rhythm.     Pulses: Normal pulses.     Heart sounds: Normal heart sounds.  Pulmonary:     Effort: Pulmonary effort is normal.     Breath sounds: Normal breath sounds. No decreased breath sounds, rhonchi or rales.  Abdominal:     General: Abdomen is flat.     Palpations: Abdomen is soft.  Skin:    General: Skin is warm and dry.  Neurological:     General: No focal deficit present.  Psychiatric:        Mood and Affect: Mood normal.     (all labs ordered are listed, but only abnormal results are displayed) Labs Reviewed  RESP PANEL BY RT-PCR (RSV, FLU A&B, COVID)  RVPGX2 - Abnormal; Notable for the following components:      Result Value   Influenza A by PCR POSITIVE (*)    All other components within normal limits  CBC WITH DIFFERENTIAL/PLATELET - Abnormal; Notable for the following components:   Lymphs Abs 0.4 (*)    All other components within normal limits  COMPREHENSIVE METABOLIC PANEL WITH GFR - Abnormal; Notable for the following components:   CO2 20 (*)    Anion gap 16 (*)    All other components within normal limits    EKG: None  Radiology: DG Chest 2 View Result Date: 02/07/2024 CLINICAL DATA:  Shortness of breath and chest pain x1 day. EXAM: CHEST - 2 VIEW COMPARISON:  September 02, 2017 FINDINGS: The heart size and mediastinal contours are within normal limits. Both lungs are clear. The visualized skeletal structures are unremarkable. IMPRESSION: No active cardiopulmonary disease. Electronically Signed   By: Suzen Dials M.D.   On: 02/07/2024 17:49     Procedures   Medications Ordered in the ED  ipratropium-albuterol   (DUONEB) 0.5-2.5 (3) MG/3ML nebulizer solution 3 mL (3 mLs Nebulization Given 02/07/24 1743)  methylPREDNISolone  sodium succinate (SOLU-MEDROL ) 125 mg/2 mL injection 125 mg (125 mg Intravenous Given 02/07/24 1752)  ipratropium-albuterol  (DUONEB) 0.5-2.5 (3) MG/3ML nebulizer solution 3 mL (3 mLs Nebulization Given 02/07/24 1743)                                    Medical Decision Making Amount and/or Complexity of Data Reviewed Radiology: ordered.  Risk Prescription drug management.   39 year old well appearing female presenting for flulike symptoms and intermittent shortness of  breath.  Exam notable for mild diffuse expiratory wheezing but overall she looks well.  Respiratory panel revealed that she is positive for the flu.  Has not had chest pain during this encounter.  EKG showing tachycardia.  Suspect her tachycardia is related to the fact she has the flu.  Also suspect mild asthma exacerbation.  After DuoNebs x 2 and steroids she reported significant improvement.  Sent refills of her albuterol  to her pharmacy advised continue supportive care for the flu and to follow-up with her PCP.  Discussed return precautions.  Discharged good condition.  I also sent Tamiflu  to her pharmacy and discussed adverse reactions should she decide to take it.     Final diagnoses:  Flu  Uncomplicated asthma, unspecified asthma severity, unspecified whether persistent    ED Discharge Orders          Ordered    oseltamivir  (TAMIFLU ) 75 MG capsule  Every 12 hours        02/07/24 1816    albuterol  (ACCUNEB ) 1.25 MG/3ML nebulizer solution  Every 6 hours PRN        02/07/24 1816    albuterol  (VENTOLIN  HFA) 108 (90 Base) MCG/ACT inhaler  Every 6 hours PRN        02/07/24 1816               Adalea Handler K, PA-C 02/07/24 1817    Dreama Longs, MD 02/08/24 1217  "
